# Patient Record
Sex: Female | Born: 1960 | Race: Black or African American | Hispanic: No | Marital: Single | Smoking: Never smoker
Health system: Southern US, Community
[De-identification: ages and names within clinical notes are randomized; demographics above are authoritative.]

## PROBLEM LIST (undated history)

## (undated) DIAGNOSIS — E669 Obesity, unspecified: Secondary | ICD-10-CM

## (undated) DIAGNOSIS — R609 Edema, unspecified: Secondary | ICD-10-CM

## (undated) DIAGNOSIS — M1990 Unspecified osteoarthritis, unspecified site: Secondary | ICD-10-CM

## (undated) DIAGNOSIS — I10 Essential (primary) hypertension: Secondary | ICD-10-CM

## (undated) DIAGNOSIS — T7840XA Allergy, unspecified, initial encounter: Secondary | ICD-10-CM

## (undated) DIAGNOSIS — R011 Cardiac murmur, unspecified: Secondary | ICD-10-CM

## (undated) DIAGNOSIS — F419 Anxiety disorder, unspecified: Secondary | ICD-10-CM

## (undated) DIAGNOSIS — F32A Depression, unspecified: Secondary | ICD-10-CM

## (undated) DIAGNOSIS — F329 Major depressive disorder, single episode, unspecified: Secondary | ICD-10-CM

## (undated) DIAGNOSIS — R0600 Dyspnea, unspecified: Secondary | ICD-10-CM

## (undated) HISTORY — DX: Depression, unspecified: F32.A

## (undated) HISTORY — PX: DILATION AND CURETTAGE OF UTERUS: SHX78

## (undated) HISTORY — DX: Allergy, unspecified, initial encounter: T78.40XA

## (undated) HISTORY — DX: Essential (primary) hypertension: I10

## (undated) HISTORY — DX: Cardiac murmur, unspecified: R01.1

## (undated) HISTORY — DX: Anxiety disorder, unspecified: F41.9

## (undated) HISTORY — DX: Unspecified osteoarthritis, unspecified site: M19.90

## (undated) HISTORY — DX: Major depressive disorder, single episode, unspecified: F32.9

## (undated) HISTORY — DX: Dyspnea, unspecified: R06.00

## (undated) HISTORY — DX: Obesity, unspecified: E66.9

## (undated) HISTORY — DX: Edema, unspecified: R60.9

---

## 1998-11-16 ENCOUNTER — Other Ambulatory Visit: Admission: RE | Admit: 1998-11-16 | Discharge: 1998-11-16 | Payer: Self-pay | Admitting: Obstetrics & Gynecology

## 1999-11-29 ENCOUNTER — Other Ambulatory Visit: Admission: RE | Admit: 1999-11-29 | Discharge: 1999-11-29 | Payer: Self-pay | Admitting: Obstetrics & Gynecology

## 2000-12-04 ENCOUNTER — Other Ambulatory Visit: Admission: RE | Admit: 2000-12-04 | Discharge: 2000-12-04 | Payer: Self-pay | Admitting: Obstetrics & Gynecology

## 2001-12-06 ENCOUNTER — Other Ambulatory Visit: Admission: RE | Admit: 2001-12-06 | Discharge: 2001-12-06 | Payer: Self-pay | Admitting: Obstetrics & Gynecology

## 2002-05-22 ENCOUNTER — Other Ambulatory Visit: Admission: RE | Admit: 2002-05-22 | Discharge: 2002-05-22 | Payer: Self-pay | Admitting: Obstetrics & Gynecology

## 2003-01-07 ENCOUNTER — Other Ambulatory Visit: Admission: RE | Admit: 2003-01-07 | Discharge: 2003-01-07 | Payer: Self-pay | Admitting: Obstetrics & Gynecology

## 2003-07-13 ENCOUNTER — Other Ambulatory Visit: Admission: RE | Admit: 2003-07-13 | Discharge: 2003-07-13 | Payer: Self-pay | Admitting: Obstetrics & Gynecology

## 2004-01-20 ENCOUNTER — Other Ambulatory Visit: Admission: RE | Admit: 2004-01-20 | Discharge: 2004-01-20 | Payer: Self-pay | Admitting: Obstetrics & Gynecology

## 2005-01-31 ENCOUNTER — Other Ambulatory Visit: Admission: RE | Admit: 2005-01-31 | Discharge: 2005-01-31 | Payer: Self-pay | Admitting: Obstetrics & Gynecology

## 2008-08-03 ENCOUNTER — Encounter: Payer: Self-pay | Admitting: Cardiovascular Disease

## 2008-08-04 ENCOUNTER — Encounter: Payer: Self-pay | Admitting: Cardiovascular Disease

## 2008-08-07 ENCOUNTER — Ambulatory Visit: Payer: Self-pay | Admitting: Cardiovascular Disease

## 2008-08-07 DIAGNOSIS — I10 Essential (primary) hypertension: Secondary | ICD-10-CM | POA: Insufficient documentation

## 2008-08-07 DIAGNOSIS — R079 Chest pain, unspecified: Secondary | ICD-10-CM | POA: Insufficient documentation

## 2009-12-08 ENCOUNTER — Emergency Department (HOSPITAL_COMMUNITY): Admission: EM | Admit: 2009-12-08 | Discharge: 2009-12-08 | Payer: Self-pay | Admitting: Emergency Medicine

## 2010-08-09 ENCOUNTER — Encounter: Payer: Self-pay | Admitting: Cardiovascular Disease

## 2011-01-13 ENCOUNTER — Ambulatory Visit (INDEPENDENT_AMBULATORY_CARE_PROVIDER_SITE_OTHER): Payer: PRIVATE HEALTH INSURANCE

## 2011-01-13 DIAGNOSIS — I10 Essential (primary) hypertension: Secondary | ICD-10-CM

## 2011-01-13 DIAGNOSIS — R059 Cough, unspecified: Secondary | ICD-10-CM

## 2011-01-13 DIAGNOSIS — R05 Cough: Secondary | ICD-10-CM

## 2011-01-13 DIAGNOSIS — J069 Acute upper respiratory infection, unspecified: Secondary | ICD-10-CM

## 2011-01-16 ENCOUNTER — Ambulatory Visit: Payer: Self-pay | Admitting: Internal Medicine

## 2011-03-10 ENCOUNTER — Ambulatory Visit (INDEPENDENT_AMBULATORY_CARE_PROVIDER_SITE_OTHER): Payer: PRIVATE HEALTH INSURANCE | Admitting: Physician Assistant

## 2011-03-10 DIAGNOSIS — Z113 Encounter for screening for infections with a predominantly sexual mode of transmission: Secondary | ICD-10-CM

## 2011-03-10 DIAGNOSIS — N946 Dysmenorrhea, unspecified: Secondary | ICD-10-CM

## 2011-03-10 DIAGNOSIS — N926 Irregular menstruation, unspecified: Secondary | ICD-10-CM

## 2011-03-10 DIAGNOSIS — A599 Trichomoniasis, unspecified: Secondary | ICD-10-CM

## 2011-03-10 DIAGNOSIS — N898 Other specified noninflammatory disorders of vagina: Secondary | ICD-10-CM

## 2011-03-10 DIAGNOSIS — R350 Frequency of micturition: Secondary | ICD-10-CM

## 2011-03-10 LAB — POCT WET PREP WITH KOH
KOH Prep POC: NEGATIVE
RBC Wet Prep HPF POC: NEGATIVE
Trichomonas, UA: POSITIVE
Yeast Wet Prep HPF POC: NEGATIVE

## 2011-03-10 LAB — POCT URINALYSIS DIPSTICK
Glucose, UA: NEGATIVE
Nitrite, UA: NEGATIVE
Protein, UA: 30
Spec Grav, UA: 1.015
Urobilinogen, UA: 2

## 2011-03-10 LAB — POCT UA - MICROSCOPIC ONLY: Yeast, UA: NEGATIVE

## 2011-03-10 MED ORDER — METRONIDAZOLE 500 MG PO TABS: 500.0000 mg | ORAL_TABLET | Freq: Two times a day (BID) | ORAL | Status: AC

## 2011-03-10 NOTE — Patient Instructions (Signed)
Please be sure to use condoms with each and every act of intercourse to reduce her risk for sexually transmitted infections. I will contact you with the results of your remaining labs, please contact me if you have not heard in 10-14 days. Complete the antibiotic. Take it with food to reduce nausea. Avoid all alcohol while taking the antibiotic. I advised abstaining from sexual activity until you have completed treatment.  You need to notify both sexual partners of your infection. They will both need treatment as well, and you should not engage in sexual activity with them until they have completed treatment as well.

## 2011-03-10 NOTE — Progress Notes (Signed)
Addended by: Fernande Bras on: 03/10/2011 02:48 PM   Modules accepted: Orders

## 2011-03-10 NOTE — Progress Notes (Signed)
  Subjective:    Patient ID: Samantha Pearson, female    DOB: 07/15/1960, 51 y.o.   MRN: [id number]  HPI Patient presents with burning on urination and vaginal discharge for the last 10-14 days. She notes the discharge is a brownish color but has seen no frank blood. Over the last several days she has developed urgency and frequency with urination. No fevers chills back pain or abdominal pain. She has 2 current sexual partners and uses condoms with neither. She is suspicious that one of them had another sexual partner as well. Last menstrual period was in November. She states that her GYN believes that she is in perimenopause and plans to do a trial off her oral contraceptive pill later this year.   Review of Systems  Constitutional: Negative for fever and chills.  Gastrointestinal: Negative.   Genitourinary: Positive for dysuria, urgency, frequency, vaginal discharge and menstrual problem. Negative for hematuria, flank pain, decreased urine volume, vaginal bleeding, enuresis, difficulty urinating, genital sores, vaginal pain, pelvic pain and dyspareunia.  Musculoskeletal: Negative for back pain.       Objective:   Physical Exam  Constitutional: She is oriented to person, place, and time. [name]. No distress.  HENT:  Head: Normocephalic and atraumatic.  Cardiovascular: Normal rate, regular rhythm and normal heart sounds.   Pulmonary/Chest: Effort normal and breath sounds normal.  Abdominal: Soft. Bowel sounds are normal. She exhibits no distension and no mass. There is tenderness. There is no rebound and no guarding.  Genitourinary: Uterus normal. Pelvic exam was performed with patient supine. No labial fusion. There is no rash, tenderness, lesion or injury on the right labia. There is no rash, tenderness, lesion or injury on the left labia. No erythema, tenderness or bleeding around the vagina. No foreign body around the vagina. No signs of injury around the  vagina. Vaginal discharge found.  Lymphadenopathy:       Right: No inguinal adenopathy present.       Left: No inguinal adenopathy present.  Neurological: She is alert and oriented to person, place, and time.  Skin: Skin is warm and dry.  Psychiatric: She has a normal mood and affect.   Urinalysis is remarkable for blood and leukocytes but negative for nitrites. Microscopy significant for Trichomonas. Wet prep confirms Trichomonas. HIV, RPR, Gen-Probe are pending.       Assessment & Plan:  1. Vaginal discharge secondary to Trichomonas vaginalis.  Treat with metronidazole. 2. Urinary urgency and frequency, likely secondary to Trichomonas vaginalis.  3. STD screening secondary to high-risk sexual activity.  Counseled on consistent use of condom use to prevent sexually transmitted infections, especially in the scenario of more than one sexual partner. Await for remaining lab results.

## 2011-03-11 LAB — GC/CHLAMYDIA PROBE AMP, GENITAL: Chlamydia, DNA Probe: NEGATIVE

## 2011-03-13 ENCOUNTER — Other Ambulatory Visit: Payer: Self-pay | Admitting: Physician Assistant

## 2011-03-13 ENCOUNTER — Telehealth: Payer: Self-pay

## 2011-03-13 DIAGNOSIS — N390 Urinary tract infection, site not specified: Secondary | ICD-10-CM

## 2011-03-13 MED ORDER — AMOXICILLIN 875 MG PO TABS: 875.0000 mg | ORAL_TABLET | Freq: Two times a day (BID) | ORAL | Status: AC

## 2011-04-24 ENCOUNTER — Ambulatory Visit: Payer: PRIVATE HEALTH INSURANCE | Admitting: Internal Medicine

## 2011-05-22 ENCOUNTER — Ambulatory Visit (INDEPENDENT_AMBULATORY_CARE_PROVIDER_SITE_OTHER): Payer: PRIVATE HEALTH INSURANCE | Admitting: Internal Medicine

## 2011-05-22 ENCOUNTER — Telehealth: Payer: Self-pay | Admitting: Physician Assistant

## 2011-05-22 ENCOUNTER — Encounter: Payer: Self-pay | Admitting: Internal Medicine

## 2011-05-22 VITALS — BP 135/75 | HR 69 | Temp 98.1°F | Resp 16 | Ht 59.0 in | Wt 209.6 lb

## 2011-05-22 DIAGNOSIS — G4700 Insomnia, unspecified: Secondary | ICD-10-CM

## 2011-05-22 DIAGNOSIS — F439 Reaction to severe stress, unspecified: Secondary | ICD-10-CM | POA: Insufficient documentation

## 2011-05-22 DIAGNOSIS — I10 Essential (primary) hypertension: Secondary | ICD-10-CM

## 2011-05-22 DIAGNOSIS — Z79899 Other long term (current) drug therapy: Secondary | ICD-10-CM

## 2011-05-22 DIAGNOSIS — G8929 Other chronic pain: Secondary | ICD-10-CM

## 2011-05-22 LAB — BASIC METABOLIC PANEL
BUN: 15 mg/dL (ref 6–23)
CO2: 27 mEq/L (ref 19–32)
Glucose, Bld: 95 mg/dL (ref 70–99)
Potassium: 3.4 mEq/L — ABNORMAL LOW (ref 3.5–5.3)
Sodium: 138 mEq/L (ref 135–145)

## 2011-05-22 MED ORDER — LISINOPRIL-HYDROCHLOROTHIAZIDE 20-25 MG PO TABS: 1.0000 | ORAL_TABLET | Freq: Every day | ORAL | Status: DC

## 2011-05-22 MED ORDER — ALPRAZOLAM 1 MG PO TABS: 1.0000 mg | ORAL_TABLET | Freq: Every evening | ORAL | Status: AC | PRN

## 2011-05-22 MED ORDER — LISINOPRIL-HYDROCHLOROTHIAZIDE 10-12.5 MG PO TABS: 1.0000 | ORAL_TABLET | Freq: Every day | ORAL | Status: DC

## 2011-05-22 NOTE — Progress Notes (Signed)
  Subjective:    Patient ID: Samantha Pearson, female    DOB: 22-Jul-1960, 51 y.o.   MRN: [id number]  HPI HTN controlled Stress getting better, is working Systems developer. Chronic pain is controlled with prn tramadol 50mg  Working rotating shifts   Review of Systems     Objective:   Physical Exam  Constitutional: She is oriented to person, place, and time. She appears well-nourished. No distress.  Cardiovascular: Normal rate, regular rhythm and normal heart sounds.   Pulmonary/Chest: Effort normal and breath sounds normal.  Neurological: She is alert and oriented to person, place, and time.  Skin: Skin is warm and dry.          Assessment & Plan:  HTN Stress INsomnia do to shift work,trial of alprazolam .5 qhs

## 2011-05-22 NOTE — Telephone Encounter (Signed)
Pharmacy fax received requesting clarification: LisinoprilHCT 10/12.5 or 20/25. Chart reviewed.  It appears that the patient now takes 20/25.

## 2011-05-22 NOTE — Patient Instructions (Signed)

## 2011-11-27 ENCOUNTER — Ambulatory Visit: Payer: PRIVATE HEALTH INSURANCE | Admitting: Internal Medicine

## 2011-12-01 ENCOUNTER — Encounter: Payer: Self-pay | Admitting: Internal Medicine

## 2011-12-01 ENCOUNTER — Ambulatory Visit (INDEPENDENT_AMBULATORY_CARE_PROVIDER_SITE_OTHER): Payer: PRIVATE HEALTH INSURANCE | Admitting: Internal Medicine

## 2011-12-01 VITALS — BP 125/71 | HR 69 | Temp 98.0°F | Resp 16 | Ht 59.5 in | Wt 213.0 lb

## 2011-12-01 DIAGNOSIS — R635 Abnormal weight gain: Secondary | ICD-10-CM

## 2011-12-01 DIAGNOSIS — E663 Overweight: Secondary | ICD-10-CM

## 2011-12-01 DIAGNOSIS — J329 Chronic sinusitis, unspecified: Secondary | ICD-10-CM

## 2011-12-01 DIAGNOSIS — I10 Essential (primary) hypertension: Secondary | ICD-10-CM

## 2011-12-01 DIAGNOSIS — G4700 Insomnia, unspecified: Secondary | ICD-10-CM

## 2011-12-01 MED ORDER — ALPRAZOLAM 1 MG PO TABS: 1.0000 mg | ORAL_TABLET | Freq: Every evening | ORAL | Status: DC | PRN

## 2011-12-01 MED ORDER — AMOXICILLIN 500 MG PO CAPS: 1000.0000 mg | ORAL_CAPSULE | Freq: Two times a day (BID) | ORAL | Status: DC

## 2011-12-01 NOTE — Addendum Note (Signed)
Addended by: Jonita Albee on: 12/01/2011 09:20 AM   Modules accepted: Orders

## 2011-12-01 NOTE — Patient Instructions (Signed)

## 2011-12-01 NOTE — Progress Notes (Signed)
  Subjective:    Patient ID: Samantha Pearson, female    DOB: 1961/01/03, 51 y.o.   MRN: [id number]  HPI BP controlled today. Has sinus infection with green discharge. Anxiety--did not get alprazolam as ordered.Insomnia persists and melatonin.  Review of Systems     Objective:   Physical Exam Tender frontals/maxillarys Lungs clear Heart nl       Assessment & Plan:  HTN to goal Sinusitis Insomnia/ anxiety

## 2011-12-11 ENCOUNTER — Telehealth: Payer: Self-pay

## 2011-12-11 NOTE — Telephone Encounter (Signed)
PT WOULD LIKE TO KNOW IF SHE NEEDS TO MAKE A FOLLOW-UP VISIT WITH DR. Perrin Maltese.

## 2011-12-12 NOTE — Telephone Encounter (Signed)
Schedule routine 6 mo f/up, or come in sooner if needed.

## 2012-05-13 ENCOUNTER — Ambulatory Visit (INDEPENDENT_AMBULATORY_CARE_PROVIDER_SITE_OTHER): Payer: PRIVATE HEALTH INSURANCE | Admitting: Internal Medicine

## 2012-05-13 VITALS — BP 140/81 | HR 66 | Temp 98.0°F | Resp 16 | Ht 60.0 in | Wt 212.0 lb

## 2012-05-13 DIAGNOSIS — F439 Reaction to severe stress, unspecified: Secondary | ICD-10-CM

## 2012-05-13 DIAGNOSIS — M722 Plantar fascial fibromatosis: Secondary | ICD-10-CM

## 2012-05-13 DIAGNOSIS — Z79899 Other long term (current) drug therapy: Secondary | ICD-10-CM

## 2012-05-13 DIAGNOSIS — I10 Essential (primary) hypertension: Secondary | ICD-10-CM

## 2012-05-13 DIAGNOSIS — G4700 Insomnia, unspecified: Secondary | ICD-10-CM

## 2012-05-13 LAB — COMPREHENSIVE METABOLIC PANEL
AST: 22 U/L (ref 0–37)
Alkaline Phosphatase: 80 U/L (ref 39–117)
BUN: 16 mg/dL (ref 6–23)
Glucose, Bld: 81 mg/dL (ref 70–99)
Potassium: 3.8 mEq/L (ref 3.5–5.3)
Sodium: 138 mEq/L (ref 135–145)
Total Bilirubin: 0.8 mg/dL (ref 0.3–1.2)
Total Protein: 7.1 g/dL (ref 6.0–8.3)

## 2012-05-13 LAB — POCT UA - MICROSCOPIC ONLY
Bacteria, U Microscopic: NEGATIVE
Mucus, UA: NEGATIVE
RBC, urine, microscopic: NEGATIVE
WBC, Ur, HPF, POC: NEGATIVE

## 2012-05-13 LAB — POCT URINALYSIS DIPSTICK
Bilirubin, UA: NEGATIVE
Ketones, UA: NEGATIVE
Leukocytes, UA: NEGATIVE
Spec Grav, UA: <=1.005

## 2012-05-13 MED ORDER — IBUPROFEN 600 MG PO TABS: 600.0000 mg | ORAL_TABLET | Freq: Three times a day (TID) | ORAL | Status: DC | PRN

## 2012-05-13 MED ORDER — LISINOPRIL-HYDROCHLOROTHIAZIDE 20-25 MG PO TABS: 1.0000 | ORAL_TABLET | Freq: Every day | ORAL | Status: DC

## 2012-05-13 MED ORDER — ALPRAZOLAM 0.5 MG PO TABS: 0.5000 mg | ORAL_TABLET | Freq: Every evening | ORAL | Status: DC | PRN

## 2012-05-13 NOTE — Progress Notes (Signed)
  Subjective:    Patient ID: Samantha Pearson, female    DOB: January 08, 1961, 52 y.o.   MRN: [id number]  HPI Doing better overall. Works 2 jobs, stressful, affects sleep. HTN controlled and meds tolerated. Does need smaller dose alprazolam to sleep   Review of Systems Same    Objective:   Physical Exam  Constitutional: She is oriented to person, place, and time. [name].  Eyes: EOM are normal.  Cardiovascular: Normal rate, regular rhythm and normal heart sounds.   Pulmonary/Chest: Effort normal and breath sounds normal.  Musculoskeletal: She exhibits tenderness.  Neurological: She is alert and oriented to person, place, and time. No cranial nerve deficit. She exhibits normal muscle tone. Coordination normal.  Psychiatric: She has a normal mood and affect. [name].          Assessment & Plan:  Plantar fasciitis HTN Insomnia/stress--change dose alprazolam to .5mg  hs cpe 6 mos

## 2012-05-13 NOTE — Patient Instructions (Addendum)
Plantar Fasciitis (Heel Spur Syndrome) with Rehab The plantar fascia is a fibrous, ligament-like, soft-tissue structure that spans the bottom of the foot. Plantar fasciitis is a condition that causes pain in the foot due to inflammation of the tissue. SYMPTOMS   Pain and tenderness on the underneath side of the foot.  Pain that worsens with standing or walking. CAUSES  Plantar fasciitis is caused by irritation and injury to the plantar fascia on the underneath side of the foot. Common mechanisms of injury include:  Direct trauma to bottom of the foot.  Damage to a small nerve that runs under the foot where the main fascia attaches to the heel bone.  Stress placed on the plantar fascia due to bone spurs. RISK INCREASES WITH:   Activities that place stress on the plantar fascia (running, jumping, pivoting, or cutting).  Poor strength and flexibility.  Improperly fitted shoes.  Tight calf muscles.  Flat feet.  Failure to warm-up properly before activity.  Obesity. PREVENTION  Warm up and stretch properly before activity.  Allow for adequate recovery between workouts.  Maintain physical fitness:  Strength, flexibility, and endurance.  Cardiovascular fitness.  Maintain a health body weight.  Avoid stress on the plantar fascia.  Wear properly fitted shoes, including arch supports for individuals who have flat feet. PROGNOSIS  If treated properly, then the symptoms of plantar fasciitis usually resolve without surgery. However, occasionally surgery is necessary. RELATED COMPLICATIONS   Recurrent symptoms that may result in a chronic condition.  Problems of the lower back that are caused by compensating for the injury, such as limping.  Pain or weakness of the foot during push-off following surgery.  Chronic inflammation, scarring, and partial or complete fascia tear, occurring more often from repeated injections. TREATMENT  Treatment initially involves the use of  ice and medication to help reduce pain and inflammation. The use of strengthening and stretching exercises may help reduce pain with activity, especially stretches of the Achilles tendon. These exercises may be performed at home or with a therapist. Your caregiver may recommend that you use heel cups of arch supports to help reduce stress on the plantar fascia. Occasionally, corticosteroid injections are given to reduce inflammation. If symptoms persist for greater than 6 months despite non-surgical (conservative), then surgery may be recommended.  MEDICATION   If pain medication is necessary, then nonsteroidal anti-inflammatory medications, such as aspirin and ibuprofen, or other minor pain relievers, such as acetaminophen, are often recommended.  Do not take pain medication within 7 days before surgery.  Prescription pain relievers may be given if deemed necessary by your caregiver. Use only as directed and only as much as you need.  Corticosteroid injections may be given by your caregiver. These injections should be reserved for the most serious cases, because they may only be given a certain number of times. HEAT AND COLD  Cold treatment (icing) relieves pain and reduces inflammation. Cold treatment should be applied for 10 to 15 minutes every 2 to 3 hours for inflammation and pain and immediately after any activity that aggravates your symptoms. Use ice packs or massage the area with a piece of ice (ice massage).  Heat treatment may be used prior to performing the stretching and strengthening activities prescribed by your caregiver, physical therapist, or athletic trainer. Use a heat pack or soak the injury in warm water. SEEK IMMEDIATE MEDICAL CARE IF:  Treatment seems to offer no benefit, or the condition worsens.  Any medications produce adverse side effects. EXERCISES RANGE   OF MOTION (ROM) AND STRETCHING EXERCISES - Plantar Fasciitis (Heel Spur Syndrome) These exercises may help you  when beginning to rehabilitate your injury. Your symptoms may resolve with or without further involvement from your physician, physical therapist or athletic trainer. While completing these exercises, remember:   Restoring tissue flexibility helps normal motion to return to the joints. This allows healthier, less painful movement and activity.  An effective stretch should be held for at least 30 seconds.  A stretch should never be painful. You should only feel a gentle lengthening or release in the stretched tissue. RANGE OF MOTION - Toe Extension, Flexion  Sit with your right / left leg crossed over your opposite knee.  Grasp your toes and gently pull them back toward the top of your foot. You should feel a stretch on the bottom of your toes and/or foot.  Hold this stretch for __________ seconds.  Now, gently pull your toes toward the bottom of your foot. You should feel a stretch on the top of your toes and or foot.  Hold this stretch for __________ seconds. Repeat __________ times. Complete this stretch __________ times per day.  RANGE OF MOTION - Ankle Dorsiflexion, Active Assisted  Remove shoes and sit on a chair that is preferably not on a carpeted surface.  Place right / left foot under knee. Extend your opposite leg for support.  Keeping your heel down, slide your right / left foot back toward the chair until you feel a stretch at your ankle or calf. If you do not feel a stretch, slide your bottom forward to the edge of the chair, while still keeping your heel down.  Hold this stretch for __________ seconds. Repeat __________ times. Complete this stretch __________ times per day.  STRETCH  Gastroc, Standing  Place hands on wall.  Extend right / left leg, keeping the front knee somewhat bent.  Slightly point your toes inward on your back foot.  Keeping your right / left heel on the floor and your knee straight, shift your weight toward the wall, not allowing your back to  arch.  You should feel a gentle stretch in the right / left calf. Hold this position for __________ seconds. Repeat __________ times. Complete this stretch __________ times per day. STRETCH  Soleus, Standing  Place hands on wall.  Extend right / left leg, keeping the other knee somewhat bent.  Slightly point your toes inward on your back foot.  Keep your right / left heel on the floor, bend your back knee, and slightly shift your weight over the back leg so that you feel a gentle stretch deep in your back calf.  Hold this position for __________ seconds. Repeat __________ times. Complete this stretch __________ times per day. STRETCH  Gastrocsoleus, Standing  Note: This exercise can place a lot of stress on your foot and ankle. Please complete this exercise only if specifically instructed by your caregiver.   Place the ball of your right / left foot on a step, keeping your other foot firmly on the same step.  Hold on to the wall or a rail for balance.  Slowly lift your other foot, allowing your body weight to press your heel down over the edge of the step.  You should feel a stretch in your right / left calf.  Hold this position for __________ seconds.  Repeat this exercise with a slight bend in your right / left knee. Repeat __________ times. Complete this stretch __________ times per day.    STRENGTHENING EXERCISES - Plantar Fasciitis (Heel Spur Syndrome)  These exercises may help you when beginning to rehabilitate your injury. They may resolve your symptoms with or without further involvement from your physician, physical therapist or athletic trainer. While completing these exercises, remember:   Muscles can gain both the endurance and the strength needed for everyday activities through controlled exercises.  Complete these exercises as instructed by your physician, physical therapist or athletic trainer. Progress the resistance and repetitions only as guided. STRENGTH - Towel  Curls  Sit in a chair positioned on a non-carpeted surface.  Place your foot on a towel, keeping your heel on the floor.  Pull the towel toward your heel by only curling your toes. Keep your heel on the floor.  If instructed by your physician, physical therapist or athletic trainer, add ____________________ at the end of the towel. Repeat __________ times. Complete this exercise __________ times per day. STRENGTH - Ankle Inversion  Secure one end of a rubber exercise band/tubing to a fixed object (table, pole). Loop the other end around your foot just before your toes.  Place your fists between your knees. This will focus your strengthening at your ankle.  Slowly, pull your big toe up and in, making sure the band/tubing is positioned to resist the entire motion.  Hold this position for __________ seconds.  Have your muscles resist the band/tubing as it slowly pulls your foot back to the starting position. Repeat __________ times. Complete this exercises __________ times per day.  Document Released: 01/23/2005 Document Revised: 04/17/2011 Document Reviewed: 05/07/2008 [facility] Patient Information 2013 [place], [place]. Stress Stress-related medical problems are becoming increasingly common. The body has a built-in physical response to stressful situations. Faced with pressure, challenge or danger, we need to react quickly. Our bodies release hormones such as cortisol and adrenaline to help do this. These hormones are part of the "fight or flight" response and affect the metabolic rate, heart rate and blood pressure, resulting in a heightened, stressed state that prepares the body for optimum performance in dealing with a stressful situation. It is likely that early man required these mechanisms to stay alive, but usually modern stresses do not call for this, and the same hormones released in today's world can damage health and reduce coping ability. CAUSES  Pressure to perform at work, at  school or in sports.  Threats of physical violence.  Money worries.  Arguments.  Family conflicts.  Divorce or separation from significant other.  Bereavement.  New job or unemployment.  Changes in location.  Alcohol or drug abuse. SOMETIMES, THERE IS NO PARTICULAR REASON FOR DEVELOPING STRESS. Almost all people are at risk of being stressed at some time in their lives. It is important to know that some stress is temporary and some is long term.  Temporary stress will go away when a situation is resolved. Most people can cope with short periods of stress, and it can often be relieved by relaxing, taking a walk, chatting through issues with friends, or having a good night's sleep.  Chronic (long-term, continuous) stress is much harder to deal with. It can be psychologically and emotionally damaging. It can be harmful both for an individual and for friends and family. SYMPTOMS Everyone reacts to stress differently. There are some common effects that help [place] recognize it. In times of extreme stress, people may:  Shake uncontrollably.  Breathe faster and deeper than normal (hyperventilate).  Vomit.  For people with asthma, stress can trigger an attack.  For  some people, stress may trigger migraine headaches, ulcers, and body pain. PHYSICAL EFFECTS OF STRESS MAY INCLUDE:  Loss of energy.  Skin problems.  Aches and pains resulting from tense muscles, including neck ache, backache and tension headaches.  Increased pain from arthritis and other conditions.  Irregular heart beat (palpitations).  Periods of irritability or anger.  Apathy or depression.  Anxiety (feeling uptight or worrying).  Unusual behavior.  Loss of appetite.  Comfort eating.  Lack of concentration.  Loss of, or decreased, sex-drive.  Increased smoking, drinking, or recreational drug use.  For women, missed periods.  Ulcers, joint pain, and muscle pain. Post-traumatic stress is the  stress caused by any serious accident, strong emotional damage, or extremely difficult or violent experience such as rape or war. Post-traumatic stress victims can experience mixtures of emotions such as fear, shame, depression, guilt or anger. It may include recurrent memories or images that may be haunting. These feelings can last for weeks, months or even years after the traumatic event that triggered them. Specialized treatment, possibly with medicines and psychological therapies, is available. If stress is causing physical symptoms, severe distress or making it difficult for you to function as normal, it is worth seeing your caregiver. It is important to remember that although stress is a usual part of life, extreme or prolonged stress can lead to other illnesses that will need treatment. It is better to visit a doctor sooner rather than later. Stress has been linked to the development of high blood pressure and heart disease, as well as insomnia and depression. There is no diagnostic test for stress since everyone reacts to it differently. But a caregiver will be able to spot the physical symptoms, such as:  Headaches.  Shingles.  Ulcers. Emotional distress such as intense worry, low mood or irritability should be detected when the doctor asks pertinent questions to identify any underlying problems that might be the cause. In case there are physical reasons for the symptoms, the doctor may also want to do some tests to exclude certain conditions. If you feel that you are suffering from stress, try to identify the aspects of your life that are causing it. Sometimes you may not be able to change or avoid them, but even a small change can have a positive ripple effect. A simple lifestyle change can make all the difference. STRATEGIES THAT CAN HELP DEAL WITH STRESS:  Delegating or sharing responsibilities.  Avoiding confrontations.  Learning to be more assertive.  Regular exercise.  Avoid  using alcohol or street drugs to cope.  Eating a healthy, balanced diet, rich in fruit and vegetables and proteins.  Finding humor or absurdity in stressful situations.  Never taking on more than you know you can handle comfortably.  Organizing your time better to get as much done as possible.  Talking to friends or family and sharing your thoughts and fears.  Listening to music or relaxation tapes.  Tensing and then relaxing your muscles, starting at the toes and working up to the head and neck. If you think that you would benefit from help, either in identifying the things that are causing your stress or in learning techniques to help you relax, see a caregiver who is capable of helping you with this. Rather than relying on medications, it is usually better to try and identify the things in your life that are causing stress and try to deal with them. There are many techniques of managing stress including counseling, psychotherapy, aromatherapy, yoga, and  exercise. Your caregiver can help you determine what is best for you. Document Released: 04/15/2002 Document Revised: 04/17/2011 Document Reviewed: 03/12/2007 [facility] Patient Information 2013 [place], [place].

## 2012-05-14 ENCOUNTER — Encounter: Payer: Self-pay | Admitting: Family Medicine

## 2012-06-27 ENCOUNTER — Ambulatory Visit (INDEPENDENT_AMBULATORY_CARE_PROVIDER_SITE_OTHER): Payer: PRIVATE HEALTH INSURANCE | Admitting: Physician Assistant

## 2012-06-27 VITALS — BP 130/82 | HR 96 | Temp 98.8°F | Resp 16 | Ht 60.0 in | Wt 210.0 lb

## 2012-06-27 DIAGNOSIS — R05 Cough: Secondary | ICD-10-CM

## 2012-06-27 DIAGNOSIS — J3489 Other specified disorders of nose and nasal sinuses: Secondary | ICD-10-CM

## 2012-06-27 DIAGNOSIS — J329 Chronic sinusitis, unspecified: Secondary | ICD-10-CM

## 2012-06-27 DIAGNOSIS — R0981 Nasal congestion: Secondary | ICD-10-CM

## 2012-06-27 DIAGNOSIS — R059 Cough, unspecified: Secondary | ICD-10-CM

## 2012-06-27 MED ORDER — AMOXICILLIN-POT CLAVULANATE 875-125 MG PO TABS: 1.0000 | ORAL_TABLET | Freq: Two times a day (BID) | ORAL | Status: DC

## 2012-06-27 MED ORDER — IPRATROPIUM BROMIDE 0.03 % NA SOLN: 2.0000 | Freq: Two times a day (BID) | NASAL | Status: DC

## 2012-06-27 NOTE — Progress Notes (Signed)
  Subjective:    Patient ID: Samantha Pearson, female    DOB: Jul 28, 1960, 52 y.o.   MRN: [id number]  HPI 52 year old female presents with 1 week history of sinus pain, pressure, nasal congestion, rhinorrhea, PND, and dry, hacking cough.  Does have a history of environmental allergies especially this time of year.  Does believe symptoms started after being exposed to a "cloud of pollen" while driving to work.  Has had a lot of sneezing, itchy ears, rhinorrhea, and PND.  Has been taking Zyrtec daily which does help but she was worried it was causing her BP to be elevated so she stopped it. Hx of a left sided nasal polyp that she reports does make her prone to sinusitis.  Denies fever, chills, headache, dizziness, SOB, wheezing, or hemoptysis.  She states she has been prescribed nasal sprays before but does not tolerate "steroid sprays" because it makes her drowsy.  Has had slight nausea and decreased appetite but no emesis.  Patient is otherwise doing well with no other concerns today.     Review of Systems  Constitutional: Negative for fever and chills.  HENT: Positive for congestion, rhinorrhea, postnasal drip and sinus pressure. Negative for ear pain, sore throat and neck stiffness.   Gastrointestinal: Positive for nausea. Negative for vomiting and abdominal pain.  Skin: Negative for rash.  Neurological: Negative for dizziness and headaches.       Objective:   Physical Exam  Constitutional: She is oriented to person, place, and time. [name].  HENT:  Head: Normocephalic and atraumatic.  Right Ear: Hearing, tympanic membrane, external ear and ear canal normal.  Left Ear: Hearing, tympanic membrane, external ear and ear canal normal.  Nose: Right sinus exhibits no maxillary sinus tenderness and no frontal sinus tenderness. Left sinus exhibits maxillary sinus tenderness. Left sinus exhibits no frontal sinus tenderness.  Mouth/Throat: Uvula is midline,  oropharynx is clear and moist and mucous membranes are normal. No oropharyngeal exudate.  Eyes: Conjunctivae are normal.  Neck: Normal range of motion. Neck supple.  Cardiovascular: Normal rate, regular rhythm and normal heart sounds.   Pulmonary/Chest: Effort normal and breath sounds normal.  Lymphadenopathy:    She has no cervical adenopathy.  Neurological: She is alert and oriented to person, place, and time.  Psychiatric: She has a normal mood and affect. [name]. Judgment and thought content normal.          Assessment & Plan:  Sinusitis - Plan: amoxicillin-clavulanate (AUGMENTIN) 875-125 MG per tablet, ipratropium (ATROVENT) 0.03 % nasal spray  Nasal congestion - Plan: ipratropium (ATROVENT) 0.03 % nasal spray  Cough  Start Augmentin 875 mg bid x 10 days Atrovent NS twice daily to help with nasal congestion and sinus pressure Continue Zyrtec daily - monitor BP If no improvement in 48-72 hours, can consider prednisone taper Follow up if symptoms worsen or fail to improve.

## 2012-12-27 ENCOUNTER — Ambulatory Visit: Payer: PRIVATE HEALTH INSURANCE

## 2012-12-27 ENCOUNTER — Ambulatory Visit: Payer: Self-pay

## 2012-12-27 ENCOUNTER — Ambulatory Visit (INDEPENDENT_AMBULATORY_CARE_PROVIDER_SITE_OTHER): Payer: PRIVATE HEALTH INSURANCE | Admitting: Internal Medicine

## 2012-12-27 VITALS — BP 128/80 | HR 68 | Temp 98.4°F | Resp 18 | Ht 60.0 in | Wt 217.0 lb

## 2012-12-27 DIAGNOSIS — R635 Abnormal weight gain: Secondary | ICD-10-CM

## 2012-12-27 DIAGNOSIS — M25571 Pain in right ankle and joints of right foot: Secondary | ICD-10-CM

## 2012-12-27 DIAGNOSIS — M79671 Pain in right foot: Secondary | ICD-10-CM

## 2012-12-27 DIAGNOSIS — M25579 Pain in unspecified ankle and joints of unspecified foot: Secondary | ICD-10-CM

## 2012-12-27 DIAGNOSIS — I10 Essential (primary) hypertension: Secondary | ICD-10-CM

## 2012-12-27 DIAGNOSIS — M79609 Pain in unspecified limb: Secondary | ICD-10-CM

## 2012-12-27 LAB — BASIC METABOLIC PANEL
BUN: 11 mg/dL (ref 6–23)
Creat: 0.78 mg/dL (ref 0.50–1.10)
Potassium: 3.9 mEq/L (ref 3.5–5.3)

## 2012-12-27 NOTE — Progress Notes (Signed)
  Subjective:    Patient ID: Samantha Pearson, female    DOB: Jul 27, 1960, 52 y.o.   MRN: [id number]  HPI Pt here for medication refill on lisinopril/hctz. Blood pressure 136/8-128/80. Pt is also concerned about her weigh gain she noticed that due to this her right ankle is hurting all the time. She had tried special exercise with no improvement. 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    She would like help losing weight, she is exercing. She needs bp and meds f/up.    Review of Systems     Objective:   Physical Exam  Vitals reviewed. Constitutional: She is oriented to person, place, and time. She appears well-nourished. No distress.  HENT:  Head: Normocephalic.  Eyes: EOM are normal.  Cardiovascular: Normal rate.   Pulmonary/Chest: Effort normal.  Musculoskeletal: She exhibits tenderness. She exhibits no edema.       Right foot: She exhibits decreased range of motion, tenderness and bony tenderness. She exhibits no swelling, normal capillary refill, no crepitus, no deformity and no laceration.       Feet:  Neurological: She is alert and oriented to person, place, and time. She exhibits normal muscle tone. Coordination normal.  Skin: No rash noted.  Psychiatric: She has a normal mood and affect.     [facility] reading (PRIMARY) by  Dr Perrin Maltese heel spurs, no stress fx seen       Assessment & Plan:  Achilles tendonitis/Plantar fasciitis/Refer Dr. Victorino Dike HTN rf meds 1 year Obesity/Refer to [facility] nutrition

## 2012-12-27 NOTE — Patient Instructions (Addendum)
[facility] [address], [place] [address]  [phone]    [facility] [address] [place] [address] [phone]  Calorie Counting Diet A calorie counting diet requires you to eat the number of calories that are right for you in a day. Calories are the measurement of how much energy you get from the food you eat. Eating the right amount of calories is important for staying at a healthy weight. If you eat too many calories, your body will store them as fat and you may gain weight. If you eat too few calories, you may lose weight. Counting the number of calories you eat during a day will help you know if you are eating the right amount. A Registered Dietitian can determine how many calories you need in a day. The amount of calories needed varies from person to person. If your goal is to lose weight, you will need to eat fewer calories. Losing weight can benefit you if you are overweight or have health problems such as heart disease, high blood pressure, or diabetes. If your goal is to gain weight, you will need to eat more calories. Gaining weight may be necessary if you have a certain health problem that causes your body to need more energy. TIPS Whether you are increasing or decreasing the number of calories you eat during a day, it may be hard to get used to changes in what you eat and drink. The following are tips to help you keep track of the number of calories you eat.  Measure foods at home with measuring cups. This helps you know the amount of food and number of calories you are eating.  Restaurants often serve food in amounts that are larger than 1 serving. While eating out, estimate how many servings of a food you are given. For example, a serving of cooked rice is  cup or about the size of half of a fist. Knowing serving sizes will help you be aware of how much food you are eating at restaurants.  Ask for smaller portion sizes or child-size portions  at restaurants.  Plan to eat half of a meal at a restaurant. Take the rest home or share the other half with a friend.  Read the Nutrition Facts panel on food labels for calorie content and serving size. You can find out how many servings are in a package, the size of a serving, and the number of calories each serving has.  For example, a package might contain 3 cookies. The Nutrition Facts panel on that package says that 1 serving is 1 cookie. Below that, it will say there are 3 servings in the container. The calories section of the Nutrition Facts label says there are 90 calories. This means there are 90 calories in 1 cookie (1 serving). If you eat 1 cookie you have eaten 90 calories. If you eat all 3 cookies, you have eaten 270 calories (3 servings x 90 calories = 270 calories). The list below tells you how big or small some common portion sizes are.  1 oz.........4 stacked dice.  3 oz........Marland KitchenDeck of cards.  1 tsp.......Marland KitchenTip of little finger.  1 tbs......Marland KitchenMarland KitchenThumb.  2 tbs.......Marland KitchenGolf ball.   cup......Marland KitchenHalf of a fist.  1 cup.......Marland KitchenA fist. KEEP A FOOD LOG Write down every food item you eat, the amount you eat, and the number of calories in each food you eat during the day. At the end of the day, you can add up the total number  of calories you have eaten. It may help to keep a list like the one below. Find out the calorie information by reading the Nutrition Facts panel on food labels. Breakfast  Bran cereal (1 cup, 110 calories).  Fat-free milk ( cup, 45 calories). Snack  Apple (1 medium, 80 calories). Lunch  Spinach (1 cup, 20 calories).  Tomato ( medium, 20 calories).  Chicken breast strips (3 oz, 165 calories).  Shredded cheddar cheese ( cup, 110 calories).  Light [place] dressing (2 tbs, 60 calories).  Whole-wheat bread (1 slice, 80 calories).  Tub margarine (1 tsp, 35 calories).  Vegetable soup (1 cup, 160 calories). Dinner  Pork chop (3 oz, 190  calories).  Brown rice (1 cup, 215 calories).  Steamed broccoli ( cup, 20 calories).  Strawberries (1  cup, 65 calories).  Whipped cream (1 tbs, 50 calories). Daily Calorie Total: 1425 Document Released: 01/23/2005 Document Revised: 04/17/2011 Document Reviewed: 07/20/2006 [facility] Patient Information 2014 [place], [place]. Achilles Tendinitis Tendinitis a swelling and soreness of the tendon. The pain in the tendon (cord-like structure which attaches muscle to bone) is produced by tiny tears and the inflammation present in that tendon. It commonly occurs at the shoulders, heels, and elbows. It is usually caused by overusing the tendon and joint involved. Achilles tendinitis involves the Achilles tendon. This is the large tendon in the back of the leg just above the foot. It attaches the large muscles of the lower leg to the heel bone (called calcaneus).  This diagnosis (learning what is wrong) is made by examination. X-rays will be generally be normal if only tendinitis is present. HOME CARE INSTRUCTIONS   Apply ice to the injury for 15-20 minutes, 03-04 times per day. Put the ice in a plastic bag and place a towel between the bag of ice and your skin.  Try to avoid use other than gentle range of motion while the tendon is painful. Do not resume use until instructed by your caregiver. Then begin use gradually. Do not increase use to the point of pain. If pain does develop, decrease use and continue the above measures. Gradually increase activities that do not cause discomfort until you gradually achieve normal use.  Only take over-the-counter or prescription medicines for pain, discomfort, or fever as directed by your caregiver. SEEK MEDICAL CARE IF:   Your pain and swelling increase or pain is uncontrolled with medications.  You develop new, unexplained problems (symptoms) or an increase of the symptoms that brought you to your caregiver.  You develop an inability to move your toes or  foot, develop warmth and swelling in your foot, or begin running an unexplained temperature. MAKE SURE YOU:   Understand these instructions.  Will watch your condition.  Will get help right away if you are not doing well or get worse. Document Released: 11/02/2004 Document Revised: 04/17/2011 Document Reviewed: 09/04/2012 [facility] Patient Information 2014 [place], [place]. Plantar Fasciitis Plantar fasciitis is a common condition that causes foot pain. It is soreness (inflammation) of the band of tough fibrous tissue on the bottom of the foot that runs from the heel bone (calcaneus) to the ball of the foot. The cause of this soreness may be from excessive standing, poor fitting shoes, running on hard surfaces, being overweight, having an abnormal walk, or overuse (this is common in runners) of the painful foot or feet. It is also common in aerobic exercise dancers and ballet dancers. SYMPTOMS  Most people with plantar fasciitis complain of:  Severe pain in  the morning on the bottom of their foot especially when taking the first steps out of bed. This pain recedes after a few minutes of walking.  Severe pain is experienced also during walking following a long period of inactivity.  Pain is worse when walking barefoot or up stairs DIAGNOSIS   Your caregiver will diagnose this condition by examining and feeling your foot.  Special tests such as X-rays of your foot, are usually not needed. PREVENTION   Consult a sports medicine professional before beginning a new exercise program.  Walking programs offer a good workout. With walking there is a lower chance of overuse injuries common to runners. There is less impact and less jarring of the joints.  Begin all new exercise programs slowly. If problems or pain develop, decrease the amount of time or distance until you are at a comfortable level.  Wear good shoes and replace them regularly.  Stretch your foot and the heel cords at the back  of the ankle (Achilles tendon) both before and after exercise.  Run or exercise on even surfaces that are not hard. For example, asphalt is better than pavement.  Do not run barefoot on hard surfaces.  If using a treadmill, vary the incline.  Do not continue to workout if you have foot or joint problems. Seek professional help if they do not improve. HOME CARE INSTRUCTIONS   Avoid activities that cause you pain until you recover.  Use ice or cold packs on the problem or painful areas after working out.  Only take over-the-counter or prescription medicines for pain, discomfort, or fever as directed by your caregiver.  Soft shoe inserts or athletic shoes with air or gel sole cushions may be helpful.  If problems continue or become more severe, consult a sports medicine caregiver or your own health care provider. Cortisone is a potent anti-inflammatory medication that may be injected into the painful area. You can discuss this treatment with your caregiver. MAKE SURE YOU:   Understand these instructions.  Will watch your condition.  Will get help right away if you are not doing well or get worse. Document Released: 10/18/2000 Document Revised: 04/17/2011 Document Reviewed: 12/18/2007 [facility] Patient Information 2014 [place], [place].

## 2012-12-30 ENCOUNTER — Encounter: Payer: Self-pay | Admitting: *Deleted

## 2013-05-12 ENCOUNTER — Telehealth: Payer: Self-pay

## 2013-05-12 DIAGNOSIS — Z79899 Other long term (current) drug therapy: Secondary | ICD-10-CM

## 2013-05-12 DIAGNOSIS — I10 Essential (primary) hypertension: Secondary | ICD-10-CM

## 2013-05-12 MED ORDER — LISINOPRIL-HYDROCHLOROTHIAZIDE 20-25 MG PO TABS: 1.0000 | ORAL_TABLET | Freq: Every day | ORAL | Status: DC

## 2013-05-12 NOTE — Telephone Encounter (Signed)
Pt scheduled an appt with Dr Patsy Lageropland for 05/19/13, but requests refill until then. bf

## 2013-05-12 NOTE — Telephone Encounter (Signed)
Sent in 90 day Rx per Dr Ernestene MentionGuest's OV note OK for 1 year RFs. Notified pt.

## 2013-05-12 NOTE — Telephone Encounter (Signed)
Pt requests refill on her bp rx  Pharmacy: [facility] on [address]  bf

## 2013-05-19 ENCOUNTER — Encounter: Payer: Self-pay | Admitting: Family Medicine

## 2013-05-19 ENCOUNTER — Ambulatory Visit (INDEPENDENT_AMBULATORY_CARE_PROVIDER_SITE_OTHER): Payer: 59 | Admitting: Family Medicine

## 2013-05-19 VITALS — BP 140/78 | HR 84 | Temp 98.8°F | Resp 16 | Ht 59.0 in | Wt 217.2 lb

## 2013-05-19 DIAGNOSIS — G4700 Insomnia, unspecified: Secondary | ICD-10-CM

## 2013-05-19 DIAGNOSIS — R635 Abnormal weight gain: Secondary | ICD-10-CM

## 2013-05-19 DIAGNOSIS — I10 Essential (primary) hypertension: Secondary | ICD-10-CM

## 2013-05-19 MED ORDER — ALPRAZOLAM 0.5 MG PO TABS: 0.5000 mg | ORAL_TABLET | Freq: Every evening | ORAL | Status: DC | PRN

## 2013-05-19 NOTE — Patient Instructions (Signed)
Please try the alprazolam to help with your sleep.  Remember this will make you sleepy, so do not take ti before you need to drive.  We want to get you to sleep earlier so you will get more rest and have time to exercise for a little while prior to going to work.   Let's recheck in about 3 months.

## 2013-05-19 NOTE — Progress Notes (Signed)
[facility] [address], [place] [id number] [phone]  Date:  05/19/2013   Name:  Samantha Pearson   DOB:  1960/08/04   MRN:  [id number]  PCP:  Tally DueGUEST, CHRIS WARREN, MD    Chief Complaint: Follow-up   History of Present Illness:  Samantha Pearson is a 53 y.o. very pleasant female patient who presents with the following:  History of HTN- here today for a recheck of her BP.  She is on lisinopril/ hctz.  She did take her medication as usual today.  She is not fasting today  Concerned about weight gain over the last year or so. 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  She is not sleeping much at all, and admits she does not exrcise much and is not eating well either.   When she gets home in the morning she will be too activated form work to get to sleep for a couple of hours and then her sleep is fitful.  She may sleep for about 5 hours total before needing to go to her part- time job which she does before reporting for her 3rd shift.   She had an rx for xanax but did not get it filled in the past- wonders if this would be helpful for her.     Wt Readings from Last 3 Encounters:  05/19/13 217 lb 3.2 oz (98.521 kg)  12/27/12 217 lb (98.431 kg)  06/27/12 210 lb (95.255 kg)     Patient Active Problem List   Diagnosis Date Noted  . Stress 05/22/2011  . ESSENTIAL HYPERTENSION, BENIGN 08/07/2008  . CHEST PAIN 08/07/2008    Past Medical History  Diagnosis Date  . Chest pain 08/03/08    Urgent care visit  . Hypertension   . Anxiety   . Edema   . Dyspnea   . Obesity   . Allergy   . Depression     Past Surgical History  Procedure Laterality Date  . Cesarean section      History  Substance Use Topics  . Smoking status: Never Smoker   . Smokeless tobacco: Not on file     Comment: Nonsmoker  . Alcohol Use: No    Family History  Problem Relation Age of Onset  .  Heart failure Father     CHF    Allergies  Allergen Reactions  . Doxycycline Hives, Itching and Swelling  . Septra [Bactrim] Hives, Itching and Swelling  . Latex Swelling and Rash    Medication list has been reviewed and updated.  Current Outpatient Prescriptions on File Prior to Visit  Medication Sig Dispense Refill  . amoxicillin-clavulanate (AUGMENTIN) 875-125 MG per tablet Take 1 tablet by mouth 2 (two) times daily.  20 tablet  0  . [place]-Glucosamine-Vit D (GLUCOSAMINE COMPLEX PO) Take by mouth.      . cetirizine (ZYRTEC) 10 MG tablet Take 10 mg by mouth daily.      . Cholecalciferol 2000 UNITS TABS Take 2,000 Units by mouth daily.       . fish oil-omega-3 fatty acids 1000 MG capsule Take 1 g by mouth daily.        Marland Kitchen. ipratropium (ATROVENT) 0.03 % nasal spray Place 2 sprays into the nose 2 (two) times daily.  30 mL  5  . lisinopril-hydrochlorothiazide (PRINZIDE,ZESTORETIC) 20-25 MG per tablet Take 1 tablet by  mouth daily.  90 tablet  0  . magnesium 30 MG tablet Take 30 mg by mouth 2 (two) times daily.       No current facility-administered medications on file prior to visit.    Review of Systems: As per HPI- otherwise negative.    Physical Examination: Filed Vitals:   05/19/13 1151  BP: 140/78  Pulse: 84  Temp: 98.8 F (37.1 C)  Resp: 16   Filed Vitals:   05/19/13 1151  Height: 4\' 11"  (1.499 m)  Weight: 217 lb 3.2 oz (98.521 kg)   Body mass index is 43.85 kg/(m^2). Ideal Body Weight: Weight in (lb) to have BMI = 25: 123.5  GEN: WDWN, NAD, Non-toxic, A & O x 3, obese HEENT: Atraumatic, Normocephalic. Neck supple. No masses, No LAD. Ears and Nose: No external deformity. CV: RRR, No M/G/R. No JVD. No thrill. No extra heart sounds. PULM: CTA B, no wheezes, crackles, rhonchi. No retractions. No resp. distress. No accessory muscle use. ABD: S, NT, ND, +BS. No rebound. No HSM. EXTR: No c/c/e NEURO Normal gait.  PSYCH: Normally interactive. Conversant. Not  depressed or anxious appearing.  Calm demeanor.   Assessment and Plan: Insomnia - Plan: ALPRAZolam (XANAX) 0.5 MG tablet  Weight gain - Plan: TSH  HTN (hypertension) - Plan: Basic metabolic panel  Insomnia and weight gain.  Discussed the connection between poor sleep, stress, lack of exercise and weight gain.   Will try xanax as needed for sleep onset.  Hopefully this will cut down on the time she wastes trying to get to sleep and open up some time for exercise.  Check TSH and BMP BP is borderline today but we hope this will improve with better sleep and weight loss Plan recheck in 3 months.   Signed Abbe AmsterdamJessica Temekia Caskey, MD

## 2013-05-20 ENCOUNTER — Encounter: Payer: Self-pay | Admitting: Family Medicine

## 2013-05-20 LAB — BASIC METABOLIC PANEL
BUN: 14 mg/dL (ref 6–23)
CHLORIDE: 101 meq/L (ref 96–112)
CO2: 28 meq/L (ref 19–32)
Calcium: 9.4 mg/dL (ref 8.4–10.5)
Creat: 0.75 mg/dL (ref 0.50–1.10)
GLUCOSE: 117 mg/dL — AB (ref 70–99)
POTASSIUM: 3.4 meq/L — AB (ref 3.5–5.3)
SODIUM: 140 meq/L (ref 135–145)

## 2013-05-20 LAB — TSH: TSH: 1.558 u[IU]/mL (ref 0.350–4.500)

## 2013-08-18 ENCOUNTER — Other Ambulatory Visit: Payer: Self-pay | Admitting: Internal Medicine

## 2013-08-18 ENCOUNTER — Ambulatory Visit (INDEPENDENT_AMBULATORY_CARE_PROVIDER_SITE_OTHER): Payer: 59 | Admitting: Family Medicine

## 2013-08-18 ENCOUNTER — Encounter: Payer: Self-pay | Admitting: Family Medicine

## 2013-08-18 VITALS — BP 170/95 | HR 69 | Temp 98.1°F | Resp 16 | Ht 59.0 in | Wt 220.8 lb

## 2013-08-18 DIAGNOSIS — R7309 Other abnormal glucose: Secondary | ICD-10-CM

## 2013-08-18 DIAGNOSIS — Z6841 Body Mass Index (BMI) 40.0 and over, adult: Secondary | ICD-10-CM

## 2013-08-18 DIAGNOSIS — F4321 Adjustment disorder with depressed mood: Secondary | ICD-10-CM

## 2013-08-18 DIAGNOSIS — E669 Obesity, unspecified: Secondary | ICD-10-CM

## 2013-08-18 DIAGNOSIS — I10 Essential (primary) hypertension: Secondary | ICD-10-CM

## 2013-08-18 LAB — BASIC METABOLIC PANEL
BUN: 18 mg/dL (ref 6–23)
CHLORIDE: 102 meq/L (ref 96–112)
CO2: 29 mEq/L (ref 19–32)
CREATININE: 0.81 mg/dL (ref 0.50–1.10)
Calcium: 9.3 mg/dL (ref 8.4–10.5)
GLUCOSE: 89 mg/dL (ref 70–99)
Potassium: 4.1 mEq/L (ref 3.5–5.3)
Sodium: 139 mEq/L (ref 135–145)

## 2013-08-18 LAB — HEMOGLOBIN A1C
Hgb A1c MFr Bld: 5.6 % (ref <5.7)
Mean Plasma Glucose: 114 mg/dL (ref <117)

## 2013-08-18 MED ORDER — AMLODIPINE BESYLATE 5 MG PO TABS: 5.0000 mg | ORAL_TABLET | Freq: Every day | ORAL | Status: DC

## 2013-08-18 NOTE — Patient Instructions (Signed)
Good to see you today!  I am sorry for your recent troubles.  Please let me know if you do not come through your mourning ok, or if you need any help for depression.    Add norvasc (amlodipine) 5 mg to your BP regimen.   Please give your treadmill another try- I think that your soreness will likely resolve in 2 or 3 weeks.    I will be in touch with your labs and we can plan your next visit.  Assuming all looks ok let's plan to recheck in 2 months or so

## 2013-08-18 NOTE — Progress Notes (Signed)
[facility] [address], [place] [address] [phone]  Date:  08/18/2013   Name:  Samantha RanchJanice M Pearson   DOB:  03-May-1960   MRN:  [id number]  PCP:  Tally DueGUEST, CHRIS WARREN, MD    Chief Complaint: Follow-up   History of Present Illness:  Samantha Pearson is a 53 y.o. very pleasant female patient who presents with the following:  Here today for a BP check.  Last seen in April to check her BP and discus insomnia and weight gain.   Wt Readings from Last 3 Encounters:  08/18/13 220 lb 12.8 oz (100.154 kg)  05/19/13 217 lb 3.2 oz (98.521 kg)  12/27/12 217 lb (98.431 kg)   She states things are "about the same" with her job.  She did not start the xanax rx that we gave her in April/  She states that "sleeping has been ok."  She will notice that if she gets stressed at the end of her shift she will have a harder time falling asleep.  One of her dogs was sick and had to be destroyed recently.  This has hit her pretty hard, and she had a large vet bill to content with.    She has increased her K intake to counteract her HCTZ, but would also like to be checked for DM.  She did try to exercise on her treadmill a couple of times, but noted that it made her body and joints feel sore.  She was not sure if she should continue to try  Patient Active Problem List   Diagnosis Date Noted  . Stress 05/22/2011  . ESSENTIAL HYPERTENSION, BENIGN 08/07/2008  . CHEST PAIN 08/07/2008    Past Medical History  Diagnosis Date  . Chest pain 08/03/08    Urgent care visit  . Hypertension   . Anxiety   . Edema   . Dyspnea   . Obesity   . Allergy   . Depression     Past Surgical History  Procedure Laterality Date  . Cesarean section      History  Substance Use Topics  . Smoking status: Never Smoker   . Smokeless tobacco: Not on file     Comment: Nonsmoker  . Alcohol Use: No    Family History  Problem Relation Age of Onset  . Heart failure Father     CHF    Allergies   Allergen Reactions  . Doxycycline Hives, Itching and Swelling  . Septra [Bactrim] Hives, Itching and Swelling  . Latex Swelling and Rash    Medication list has been reviewed and updated.  Current Outpatient Prescriptions on File Prior to Visit  Medication Sig Dispense Refill  . BLACK COHOSH EXTRACT PO Take by mouth daily.      . Calcium Carbonate-Vitamin D (CALCIUM + D PO) Take by mouth daily.      . Cholecalciferol 2000 UNITS TABS Take 2,000 Units by mouth daily.       . fish oil-omega-3 fatty acids 1000 MG capsule Take 1 g by mouth daily.        . Flaxseed, Linseed, (FLAXSEED OIL) 1000 MG CAPS Take by mouth daily.      Marland Kitchen. lisinopril-hydrochlorothiazide (PRINZIDE,ZESTORETIC) 20-25 MG per tablet Take 1 tablet by mouth daily.  90 tablet  0  . magnesium 30 MG tablet Take 30 mg by mouth 2 (two) times daily.      Marland Kitchen. ALPRAZolam (XANAX) 0.5 MG tablet Take 1 tablet (0.5 mg total)  by mouth at bedtime as needed for anxiety. Take 1/2 or 1 as needed  30 tablet  2  . amoxicillin-clavulanate (AUGMENTIN) 875-125 MG per tablet Take 1 tablet by mouth 2 (two) times daily.  20 tablet  0  . [place]-Glucosamine-Vit D (GLUCOSAMINE COMPLEX PO) Take by mouth.      . cetirizine (ZYRTEC) 10 MG tablet Take 10 mg by mouth daily.      Marland Kitchen ipratropium (ATROVENT) 0.03 % nasal spray Place 2 sprays into the nose 2 (two) times daily.  30 mL  5   No current facility-administered medications on file prior to visit.    Review of Systems:  As per HPI- otherwise negative.   Physical Examination: Filed Vitals:   08/18/13 1049  BP: 162/98  Pulse: 69  Temp: 98.1 F (36.7 C)  Resp: 16   Filed Vitals:   08/18/13 1049  Height: 4\' 11"  (1.499 m)  Weight: 220 lb 12.8 oz (100.154 kg)   Body mass index is 44.57 kg/(m^2). Ideal Body Weight: Weight in (lb) to have BMI = 25: 123.5  GEN: WDWN, NAD, Non-toxic, A & O x 3, obese, well appearing  HEENT: Atraumatic, Normocephalic. Neck supple. No masses, No LAD. Ears and  Nose: No external deformity. CV: RRR, No M/G/R. No JVD. No thrill. No extra heart sounds. PULM: CTA B, no wheezes, crackles, rhonchi. No retractions. No resp. distress. No accessory muscle use.Marland Kitchen EXTR: No c/c/e NEURO Normal gait.  PSYCH: Normally interactive. Conversant. Upset and tearful when discussing the recent passing of her dog.      Assessment and Plan: Essential hypertension - Plan: amLODipine (NORVASC) 5 MG tablet, Basic metabolic panel  Elevated glucose - Plan: Hemoglobin A1c  Obesity, unspecified  Adjustment disorder with depressed mood   Uncontrolled HTN.  Will add norvasc 5mg  to her regimen.  Check A1c.  Reassured that her current mourning is normal, but asked her to let me know if she develops protracted sx.   See patient instructions for more details.     Signed Abbe Amsterdam, MD

## 2013-08-19 ENCOUNTER — Encounter: Payer: Self-pay | Admitting: Family Medicine

## 2013-12-08 ENCOUNTER — Encounter: Payer: Self-pay | Admitting: Family Medicine

## 2013-12-08 ENCOUNTER — Ambulatory Visit (INDEPENDENT_AMBULATORY_CARE_PROVIDER_SITE_OTHER): Payer: 59 | Admitting: Family Medicine

## 2013-12-08 VITALS — BP 130/78 | HR 70 | Temp 98.6°F | Resp 16 | Ht 60.0 in | Wt 221.6 lb

## 2013-12-08 DIAGNOSIS — Z1211 Encounter for screening for malignant neoplasm of colon: Secondary | ICD-10-CM

## 2013-12-08 DIAGNOSIS — I10 Essential (primary) hypertension: Secondary | ICD-10-CM

## 2013-12-08 DIAGNOSIS — E669 Obesity, unspecified: Secondary | ICD-10-CM

## 2013-12-08 DIAGNOSIS — R7309 Other abnormal glucose: Secondary | ICD-10-CM

## 2013-12-08 LAB — BASIC METABOLIC PANEL
BUN: 8 mg/dL (ref 6–23)
CALCIUM: 9.7 mg/dL (ref 8.4–10.5)
CHLORIDE: 102 meq/L (ref 96–112)
CO2: 27 meq/L (ref 19–32)
Creat: 0.78 mg/dL (ref 0.50–1.10)
Glucose, Bld: 89 mg/dL (ref 70–99)
Potassium: 4 mEq/L (ref 3.5–5.3)
SODIUM: 140 meq/L (ref 135–145)

## 2013-12-08 MED ORDER — LISINOPRIL 20 MG PO TABS: 20.0000 mg | ORAL_TABLET | Freq: Every day | ORAL | Status: DC

## 2013-12-08 MED ORDER — AMLODIPINE BESYLATE 10 MG PO TABS: 5.0000 mg | ORAL_TABLET | Freq: Every day | ORAL | Status: DC

## 2013-12-08 NOTE — Progress Notes (Signed)
Subjective:    Patient ID: Samantha Pearson, female    DOB: 11/05/1960, 53 y.o.   MRN: [id number]  Tally DueGUEST, CHRIS WARREN, MD  Chief Complaint  Patient presents with  . Medication Refill  . Hypertension   Patient Active Problem List   Diagnosis Date Noted  . Obesity, unspecified 08/18/2013  . Stress 05/22/2011  . ESSENTIAL HYPERTENSION, BENIGN 08/07/2008  . CHEST PAIN 08/07/2008   Prior to Admission medications   Medication Sig Start Date End Date Taking? Authorizing Provider  amLODipine (NORVASC) 10 MG tablet Take 0.5 tablets (5 mg total) by mouth daily. 12/08/13  Yes Josetta Wigal, PA  BLACK COHOSH EXTRACT PO Take by mouth daily.   Yes Historical Provider, MD  Calcium Carbonate-Vitamin D (CALCIUM + D PO) Take by mouth daily.   Yes Historical Provider, MD  Flaxseed, Linseed, (FLAXSEED OIL) 1000 MG CAPS Take by mouth daily.   Yes Historical Provider, MD  magnesium 30 MG tablet Take 30 mg by mouth 2 (two) times daily.   Yes Historical Provider, MD  Multiple Vitamin (MULTIVITAMIN) tablet Take 1 tablet by mouth daily.   Yes Historical Provider, MD  OVER THE COUNTER MEDICATION OTC Vitamin  B12 500 mg taking daily   Yes Historical Provider, MD  ALPRAZolam (XANAX) 0.5 MG tablet Take 1 tablet (0.5 mg total) by mouth at bedtime as needed for anxiety. Take 1/2 or 1 as needed 05/19/13   Gwenlyn FoundJessica C Copland, MD  ipratropium (ATROVENT) 0.03 % nasal spray Place 2 sprays into the nose 2 (two) times daily. 06/27/12   Heather Jaquita RectorM Marte, PA-C  lisinopril (PRINIVIL,ZESTRIL) 20 MG tablet Take 1 tablet (20 mg total) by mouth daily. 12/08/13   Raelyn Ensignodd Franshesca Chipman, PA   Medications, allergies, past medical history, surgical history, family history, social history and problem list reviewed and updated.  HPI  [age] yof with PMH htn, obesity, and insomnia presents today for routine 3 month f/u.  Doing well since last visit with us. She has switched from midnight shift to swing shifts where she works all 3 shifts during a week.  She also has a 2nd job part-time. She is having a hard time finding time to exercise with this schedule. Still has treadmill at home and is ready to start attempting to exercise again. She has been watching her diet a lot more lately. Eating more fruit and healthy snacks. Also eating earlier in the day which she thinks is helping her to sleep. Unfortunately no weight loss yet.   Her insomnia is much improved since last visit, even with the swing shifts. She has not tried the xanax at all since the summer.  Taking both lisn/hctz and amlodipine as prescribed. Taking BP at home and running 130s/70s. Occasionally dizzy. No syncope though.   Due for flu vaccine, and colonoscopy. Gets her mammograms through her gyn.   Review of Systems No chest pain, SOB, HA, dizziness, vision change, N/V, diarrhea, constipation, dysuria, urinary urgency or frequency, myalgias, arthralgias or rash.     Objective:   Physical Exam  Constitutional: She is oriented to person, place, and time. [name].  BP 130/78 mmHg  Pulse 70  Temp(Src) 98.6 F (37 C) (Oral)  Resp 16  Ht 5' (1.524 m)  Wt 221 lb 9.6 oz (100.517 kg)  BMI 43.28 kg/m2  SpO2 99%  LMP 05/25/2012   Cardiovascular: Normal rate, regular rhythm, S1 normal and S2 normal.  PMI is not displaced.  Exam reveals no gallop.  Murmur heard.  Systolic murmur is present with a grade of 2/6  2/6 systolic ejection murmur loudest at RSB.   Pulmonary/Chest: Effort normal and breath sounds normal. She has no decreased breath sounds. She has no wheezes. She has no rhonchi. She has no rales.  Musculoskeletal:       Right lower leg: She exhibits no edema.       Left lower leg: She exhibits no edema.  Neurological: She is alert and oriented to person, place, and time.  Psychiatric: She has a normal mood and affect. Her speech is normal.      Assessment & Plan:   [age] yof with PMH htn, obesity, and insomnia presents today for  routine 3 month f/u.  Essential hypertension - Plan: Basic metabolic panel, amLODipine (NORVASC) 10 MG tablet --Well controlled on current regimen but pt experiencing occas dizziness --DC lisin/hctz combo, start lisinopril 20mg  qd. Increase amlodipine to 10mg  qd --Continue to monitor at home, if running lower than 120/80, can discuss DC lisinopril --BMP today  Elevated glucose - Plan: Basic metabolic panel --AIC 5.6% last visit  Encounter for screening colonoscopy - Plan: Ambulatory referral to Gastroenterology --Referral for colonoscopy  Obesity -  --Encouraged increasing slowly to 30 minutes aerobic exercise most days of week --Encouraged continuing healthier diet choices  Donnajean Lopesodd M. Charlen Bakula, PA-C Physician Assistant-Certified [facility]  12/08/2013 8:58 AM

## 2013-12-08 NOTE — Patient Instructions (Signed)
Your BP looks great today!  If you would like, let's have you change to lisinopril 20 mg (take away the HCTZ) and increase norvasc to 10 mg. If you find that your BP is running around 120/80 or less, we can then try stopping the lisinopril.  Please come and see me in about 3 months.  Take care and do work on trying to exercise

## 2013-12-09 ENCOUNTER — Encounter: Payer: Self-pay | Admitting: Internal Medicine

## 2013-12-23 ENCOUNTER — Ambulatory Visit (INDEPENDENT_AMBULATORY_CARE_PROVIDER_SITE_OTHER): Payer: 59 | Admitting: Family Medicine

## 2013-12-23 VITALS — BP 140/75 | HR 70 | Temp 98.9°F | Resp 18 | Ht 59.25 in | Wt 224.4 lb

## 2013-12-23 DIAGNOSIS — L0231 Cutaneous abscess of buttock: Secondary | ICD-10-CM

## 2013-12-23 DIAGNOSIS — R509 Fever, unspecified: Secondary | ICD-10-CM

## 2013-12-23 LAB — POCT INFLUENZA A/B
INFLUENZA B, POC: NEGATIVE
Influenza A, POC: NEGATIVE

## 2013-12-23 LAB — POCT CBC
Granulocyte percent: 75.7 %G (ref 37–80)
HCT, POC: 38.2 % (ref 37.7–47.9)
HEMOGLOBIN: 11.6 g/dL — AB (ref 12.2–16.2)
Lymph, poc: 2.5 (ref 0.6–3.4)
MCH, POC: 26.4 pg — AB (ref 27–31.2)
MCHC: 30.3 g/dL — AB (ref 31.8–35.4)
MCV: 87.3 fL (ref 80–97)
MID (cbc): 0.7 (ref 0–0.9)
MPV: 8.2 fL (ref 0–99.8)
POC Granulocyte: 10 — AB (ref 2–6.9)
POC LYMPH PERCENT: 19 %L (ref 10–50)
POC MID %: 5.3 % (ref 0–12)
Platelet Count, POC: 231 10*3/uL (ref 142–424)
RBC: 4.38 M/uL (ref 4.04–5.48)
RDW, POC: 15.7 %
WBC: 13.2 10*3/uL — AB (ref 4.6–10.2)

## 2013-12-23 MED ORDER — IBUPROFEN 200 MG PO TABS: 400.0000 mg | ORAL_TABLET | Freq: Once | ORAL | Status: DC

## 2013-12-23 MED ORDER — CEFTRIAXONE SODIUM 1 G IJ SOLR
1.0000 g | Freq: Once | INTRAMUSCULAR | Status: AC
  Administered 2013-12-23: 1 g via INTRAMUSCULAR

## 2013-12-23 MED ORDER — CLINDAMYCIN HCL 300 MG PO CAPS: 300.0000 mg | ORAL_CAPSULE | Freq: Four times a day (QID) | ORAL | Status: DC

## 2013-12-23 NOTE — Progress Notes (Signed)
[facility] [address], [place] [address] [phone]  Date:  12/23/2013   Name:  Samantha Pearson   DOB:  1960-11-13   MRN:  [id number]  PCP:  Tally DueGUEST, CHRIS WARREN, MD    Chief Complaint: Recurrent Skin Infections   History of Present Illness:  Samantha Pearson is a 53 y.o. very pleasant female patient who presents with the following:  Here today with an abscess on her buttock.  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  She has noted a likely boil for about one week. She at first thought it might be a bug bite of some kind.  Then a few days later a bump developed which has progressed.  It is now more painful, and has gotten larger.   She was not aware of a fever.  She has felt achy, tired, chilled.  She has been exposed to illness at her job.  Not sure if she was exposed to the flu.  She noted onset of sx over the last 5 days with stuffy nose, cough.  She does have some discolored nasal drainage, and cough can be productive.  Mild scratchy throat, ears feel itchy.   No GI symptoms.       Patient Active Problem List   Diagnosis Date Noted  . Obesity, unspecified 08/18/2013  . Stress 05/22/2011  . ESSENTIAL HYPERTENSION, BENIGN 08/07/2008  . CHEST PAIN 08/07/2008    Past Medical History  Diagnosis Date  . Chest pain 08/03/08    Urgent care visit  . Hypertension   . Anxiety   . Edema   . Dyspnea   . Obesity   . Allergy   . Depression     Past Surgical History  Procedure Laterality Date  . Cesarean section      History  Substance Use Topics  . Smoking status: Never Smoker   . Smokeless tobacco: Not on file     Comment: Nonsmoker  . Alcohol Use: No    Family History  Problem Relation Age of Onset  . Heart failure Father     CHF    Allergies  Allergen Reactions  . Doxycycline Hives, Itching and Swelling  . Septra [Bactrim] Hives, Itching and Swelling  . Latex Swelling and Rash    Medication list has been reviewed  and updated.  Current Outpatient Prescriptions on File Prior to Visit  Medication Sig Dispense Refill  . amLODipine (NORVASC) 10 MG tablet Take 0.5 tablets (5 mg total) by mouth daily. 30 tablet 5  . BLACK COHOSH EXTRACT PO Take by mouth daily.    . Calcium Carbonate-Vitamin D (CALCIUM + D PO) Take by mouth daily.    . Flaxseed, Linseed, (FLAXSEED OIL) 1000 MG CAPS Take by mouth daily.    Marland Kitchen. lisinopril (PRINIVIL,ZESTRIL) 20 MG tablet Take 1 tablet (20 mg total) by mouth daily. 30 tablet 5  . magnesium 30 MG tablet Take 30 mg by mouth 2 (two) times daily.    . Multiple Vitamin (MULTIVITAMIN) tablet Take 1 tablet by mouth daily.    Marland Kitchen. OVER THE COUNTER MEDICATION OTC Vitamin  B12 500 mg taking daily    . ALPRAZolam (XANAX) 0.5 MG tablet Take 1 tablet (0.5 mg total) by mouth at bedtime as needed for anxiety. Take 1/2 or 1 as needed 30 tablet 2  . ipratropium (ATROVENT) 0.03 % nasal spray Place 2 sprays into the nose 2 (two) times daily. 30 mL 5  No current facility-administered medications on file prior to visit.    Review of Systems:  As per HPI- otherwise negative.   Physical Examination: Filed Vitals:   12/23/13 0848  BP: 152/94  Pulse: 107  Temp: 100.1 F (37.8 C)  Resp: 18   Filed Vitals:   12/23/13 0848  Height: 4' 11.25" (1.505 m)  Weight: 224 lb 6.4 oz (101.787 kg)   Body mass index is 44.94 kg/(m^2). Ideal Body Weight: Weight in (lb) to have BMI = 25: 124.6  GEN: WDWN, NAD, Non-toxic, A & O x 3, obese, looks well HEENT: Atraumatic, Normocephalic. Neck supple. No masses, No LAD.  Bilateral TM wnl, oropharynx normal.  PEERL,EOMI.   Ears and Nose: No external deformity. CV: RRR, No M/G/R. No JVD. No thrill. No extra heart sounds. PULM: CTA B, no wheezes, crackles, rhonchi. No retractions. No resp. distress. No accessory muscle use. ABD: S, NT, ND, +BS. No rebound. No HSM. EXTR: No c/c/e NEURO Normal gait.  PSYCH: Normally interactive. Conversant. Not depressed or  anxious appearing.  Calm demeanor.  Right buttock: there is a large tender and red area with slight fluctuance towards the gluteal cleft.    VC obtained.  Area prepped with betadine and anesthesai with 5ml of 1% lidocaine. I and D with 11 blade and pus expressed. packied with about 6 inches of 1/4in packing and dressed  Given 1 gm of rocephin, 400mg  of ibuprofen  Results for orders placed or performed in visit on 12/23/13  POCT CBC  Result Value Ref Range   WBC 13.2 (A) 4.6 - 10.2 K/uL   Lymph, poc 2.5 0.6 - 3.4   POC LYMPH PERCENT 19.0 10 - 50 %L   MID (cbc) 0.7 0 - 0.9   POC MID % 5.3 0 - 12 %M   POC Granulocyte 10.0 (A) 2 - 6.9   Granulocyte percent 75.7 37 - 80 %G   RBC 4.38 4.04 - 5.48 M/uL   Hemoglobin 11.6 (A) 12.2 - 16.2 g/dL   HCT, POC 96.0 45.4 - 47.9 %   MCV 87.3 80 - 97 fL   MCH, POC 26.4 (A) 27 - 31.2 pg   MCHC 30.3 (A) 31.8 - 35.4 g/dL   RDW, POC 09.8 %   Platelet Count, POC 231 142 - 424 K/uL   MPV 8.2 0 - 99.8 fL  POCT Influenza A/B  Result Value Ref Range   Influenza A, POC Negative    Influenza B, POC Negative     Assessment and Plan: Abscess of right buttock - Plan: Wound culture, POCT CBC, cefTRIAXone (ROCEPHIN) injection 1 g, clindamycin (CLEOCIN) 300 MG capsule  Other specified fever - Plan: POCT CBC, POCT Influenza A/B, clindamycin (CLEOCIN) 300 MG capsule  Here today with low grade fever and abscess on there right buttock. Suspect MRSA so will start tx with clindamycin.  Await culture, she will come in tomorrow for a recheck.  Her VS normalized and she felt better after treatment.  Do not suspect that she is septic but cautioned her to seek care right away if she starts feeling worse  Meds ordered this encounter  Medications  . cefTRIAXone (ROCEPHIN) injection 1 g    Sig:     Order Specific Question:  Antibiotic Indication:    Answer:  Cellulitis  . clindamycin (CLEOCIN) 300 MG capsule    Sig: Take 1 capsule (300 mg total) by mouth 4 (four) times  daily.    Dispense:  40 capsule  Refill:  0  . ibuprofen (ADVIL,MOTRIN) tablet 400 mg    Sig:      Signed Abbe AmsterdamJessica Vansh Reckart, MD

## 2013-12-23 NOTE — Patient Instructions (Addendum)
Please come and see us tomorrow for a wound check  Fast pass with any PA for a wound check 11/17.  Take the clindamycin (antibiotic) as directed.  I will send out your wound culture and let you know the restuls Rest, drink plenty of water.  If you are feeling worse please let us know! Use ibuprofen and/ or tylenol for your fever and other symptoms.  Just leave the bandage on your wound tonight.  Tomorrow before you come in you can take a shower and pat dry We will change out the bandage for you tomorrow

## 2013-12-24 ENCOUNTER — Ambulatory Visit (INDEPENDENT_AMBULATORY_CARE_PROVIDER_SITE_OTHER): Payer: 59 | Admitting: Physician Assistant

## 2013-12-24 VITALS — BP 154/80 | HR 77 | Temp 98.5°F | Resp 18 | Ht 59.0 in | Wt 224.6 lb

## 2013-12-24 DIAGNOSIS — L0231 Cutaneous abscess of buttock: Secondary | ICD-10-CM

## 2013-12-24 NOTE — Patient Instructions (Signed)
Continue the antibiotic as prescribed. Apply a warm compress to the area for 15-20 minutes 2-4 times each day. Change the dressing if it becomes saturated, leaks or falls off. Stay off your bottom as much as possible. Lying on your stomach is the best position for this.

## 2013-12-25 ENCOUNTER — Ambulatory Visit: Payer: 59

## 2013-12-25 ENCOUNTER — Other Ambulatory Visit: Payer: Self-pay | Admitting: Family Medicine

## 2013-12-25 DIAGNOSIS — L0231 Cutaneous abscess of buttock: Secondary | ICD-10-CM

## 2013-12-25 NOTE — Progress Notes (Signed)
Pt came in today for a wound check,  She is feeling overall better.  She has noted some loose stools but no diarrhea.   She has not noted a fever.  T 98.4, P 71, R 18,  BP 164/84  Right buttock abscess continues to look better, redness and heat are reduced and tenderness is less. Removed packing and re-packed with approx 4 inches of 1/4 packing and dressed with hypafix.   She will come back for a recheck tomorrow or Saturday depending on her progress. She will decrease her abx to TID. She is on clindamycin, unsure why this does not appear on her med list.    Of note this note was written in an orders only encounter because a computer system problem [facility].  No charge

## 2013-12-26 LAB — WOUND CULTURE: GRAM STAIN: NONE SEEN

## 2013-12-26 NOTE — Progress Notes (Signed)
   Subjective:    Patient ID: Samantha Pearson, female    DOB: 1960-08-08, 53 y.o.   MRN: [id number]   PCP: Tally DueGUEST, CHRIS WARREN, MD  Chief Complaint  Patient presents with  . Wound Check    Allergies  Allergen Reactions  . Doxycycline Hives, Itching and Swelling  . Septra [Bactrim] Hives, Itching and Swelling  . Latex Swelling and Rash    Patient Active Problem List   Diagnosis Date Noted  . Obesity, unspecified 08/18/2013  . Stress 05/22/2011  . ESSENTIAL HYPERTENSION, BENIGN 08/07/2008  . CHEST PAIN 08/07/2008    Prior to Admission medications   Medication Sig Start Date End Date Taking? Authorizing Provider  amLODipine (NORVASC) 10 MG tablet Take 0.5 tablets (5 mg total) by mouth daily. 12/08/13  Yes Todd McVeigh, PA  BLACK COHOSH EXTRACT PO Take by mouth daily.   Yes Historical Provider, MD  Calcium Carbonate-Vitamin D (CALCIUM + D PO) Take by mouth daily.   Yes Historical Provider, MD  lisinopril (PRINIVIL,ZESTRIL) 20 MG tablet Take 1 tablet (20 mg total) by mouth daily. 12/08/13  Yes Todd McVeigh, PA  magnesium 30 MG tablet Take 30 mg by mouth 2 (two) times daily.   Yes Historical Provider, MD  Multiple Vitamin (MULTIVITAMIN) tablet Take 1 tablet by mouth daily.   Yes Historical Provider, MD  OVER THE COUNTER MEDICATION OTC Vitamin  B12 500 mg taking daily   Yes Historical Provider, MD    Medical, Surgical, Family and Social History reviewed and updated.  HPI  Presents for wound care, s/p I&D of buttock abscess on 12/23/2013. She is tolerating clindamycin well, and while the area remains quite tender, she thinks it's a little bit better.  Review of Systems     Objective:   Physical Exam  BP 154/80 mmHg  Pulse 77  Temp(Src) 98.5 F (36.9 C) (Oral)  Resp 18  Ht 4\' 11"  (1.499 m)  Wt 224 lb 9.6 oz (101.878 kg)  BMI 45.34 kg/m2  SpO2 98%  LMP 05/25/2012 WDWNBF, A&O x 3. Dressing and packing removed from the RIGHT buttock, adjacent to the intergluteal cleft.  Considerable induration remains. No purulence expressed. Wound bed with some eschar, while was removed easily with pick ups. Packed with 1/4 inch plain packing and dressing applied.      Assessment & Plan:  1. Abscess of right buttock Continue clindamycin. Warm compresses to the site. Avoid sitting, lying supine whenever possible. RTC 2 days, sooner if needed.   Fernande Brashelle S. Marisella Puccio, PA-C Physician Assistant-Certified [facility]

## 2013-12-27 ENCOUNTER — Ambulatory Visit (INDEPENDENT_AMBULATORY_CARE_PROVIDER_SITE_OTHER): Payer: 59 | Admitting: Family Medicine

## 2013-12-27 VITALS — BP 164/76 | HR 87 | Temp 98.3°F | Resp 16 | Ht 59.0 in | Wt 225.0 lb

## 2013-12-27 DIAGNOSIS — L0231 Cutaneous abscess of buttock: Secondary | ICD-10-CM

## 2013-12-27 NOTE — Patient Instructions (Signed)
Come back tomorrow for dressing change. Then you will come back in 2 days after that to have your packing removed. Continue with your antibiotic.

## 2013-12-27 NOTE — Progress Notes (Signed)
Subjective:    Patient ID: Samantha Pearson, female    DOB: 1960/03/11, 53 y.o.   MRN: [id number] Patient Active Problem List   Diagnosis Date Noted  . Obesity, unspecified 08/18/2013  . Stress 05/22/2011  . ESSENTIAL HYPERTENSION, BENIGN 08/07/2008  . CHEST PAIN 08/07/2008   Prior to Admission medications   Medication Sig Start Date End Date Taking? Authorizing Provider  amLODipine (NORVASC) 10 MG tablet Take 0.5 tablets (5 mg total) by mouth daily. 12/08/13  Yes Todd McVeigh, PA  BLACK COHOSH EXTRACT PO Take by mouth daily.   Yes Historical Provider, MD  Calcium Carbonate-Vitamin D (CALCIUM + D PO) Take by mouth daily.   Yes Historical Provider, MD  lisinopril (PRINIVIL,ZESTRIL) 20 MG tablet Take 1 tablet (20 mg total) by mouth daily. 12/08/13  Yes Todd McVeigh, PA  magnesium 30 MG tablet Take 30 mg by mouth 2 (two) times daily.   Yes Historical Provider, MD  Multiple Vitamin (MULTIVITAMIN) tablet Take 1 tablet by mouth daily.   Yes Historical Provider, MD  OVER THE COUNTER MEDICATION OTC Vitamin  B12 500 mg taking daily   Yes Historical Provider, MD   Allergies  Allergen Reactions  . Doxycycline Hives, Itching and Swelling  . Septra [Bactrim] Hives, Itching and Swelling  . Latex Swelling and Rash   HPI  This is a 53 year old female presenting for wound check s/p right buttock abscess on 12/23/13. She was seen on 11/18 for wound check and no additional purulence noted. Her culture did not grow out significant bacteria. She has been taking clindamycin - at last visit her dosing was decreased from QID to TID d/t loose stools. She continues to have increased stools three times a day but not as loose. She is not having any bloody stools. She denies fever, chills. The area on her buttock is less tender. She notes she is unable to reach the area at home for dressing changes and does not have anyone to help her. She is wearing pads in her underwear but still getting drainage on her  clothes.  Review of Systems  Constitutional: Negative for fever and chills.  Gastrointestinal: Negative for nausea, vomiting and diarrhea.  Skin: Positive for wound.      Objective:   Physical Exam  Constitutional: She is oriented to person, place, and time. [name]. No distress.  HENT:  Head: Normocephalic and atraumatic.  Right Ear: Hearing normal.  Left Ear: Hearing normal.  Nose: Nose normal.  Eyes: Conjunctivae and lids are normal. Right eye exhibits no discharge. Left eye exhibits no discharge. No scleral icterus.  Pulmonary/Chest: Effort normal. No respiratory distress.  Musculoskeletal: Normal range of motion.  Neurological: She is alert and oriented to person, place, and time.  Skin: Skin is warm and dry.  Dressing and packing removed. Area of induration 3x2 inches. Wound cavity 2 cm, wound incision 1 cm. Wound was irrigated with 5 cc 1% lido. No additional purulence. Cavity was loosely packed with 1/4 inch packing. Wound dressed and wound care discussed.  Psychiatric: She has a normal mood and affect. Her speech is normal and behavior is normal. Thought content normal.   BP 164/76 mmHg  Pulse 87  Temp(Src) 98.3 F (36.8 C)  Resp 16  Ht 4\' 11"  (1.499 m)  Wt 225 lb (102.059 kg)  BMI 45.42 kg/m2  SpO2 97%  LMP 05/25/2012     Assessment & Plan:  1. Abscess of right buttock No additional purulence  expressed. Since clindamycin has caused some increased loose stools and no significant bacteria was grown on culture, will cut back dosing to BID. She will continue this dose for a total of 10 days of treatment. She is having a hard time reaching the area for dressing changes - she will return tomorrow for dressing change and will pull some of the packing out.  Roswell MinersNicole V. Dyke BrackettBush, PA-C, M[facility]  12/27/2013

## 2013-12-28 ENCOUNTER — Ambulatory Visit (INDEPENDENT_AMBULATORY_CARE_PROVIDER_SITE_OTHER): Payer: 59 | Admitting: Physician Assistant

## 2013-12-28 VITALS — BP 144/86 | HR 86 | Temp 97.6°F | Resp 16 | Ht 59.0 in | Wt 225.6 lb

## 2013-12-28 DIAGNOSIS — L0231 Cutaneous abscess of buttock: Secondary | ICD-10-CM

## 2013-12-28 DIAGNOSIS — L292 Pruritus vulvae: Secondary | ICD-10-CM

## 2013-12-28 MED ORDER — FLUCONAZOLE 150 MG PO TABS: 150.0000 mg | ORAL_TABLET | Freq: Once | ORAL | Status: DC

## 2013-12-28 NOTE — Progress Notes (Signed)
The patient was discussed with me and I agree with the diagnosis and treatment plan.  

## 2013-12-28 NOTE — Progress Notes (Signed)
Subjective:    Patient ID: Samantha RanchJanice M Nolte, female    DOB: October 14, 1960, 53 y.o.   MRN: [id number] Patient Active Problem List   Diagnosis Date Noted  . Obesity, unspecified 08/18/2013  . Stress 05/22/2011  . ESSENTIAL HYPERTENSION, BENIGN 08/07/2008  . CHEST PAIN 08/07/2008   Home medications   Medication Sig Start Date End Date Taking? Authorizing Provider  amLODipine (NORVASC) 10 MG tablet Take 0.5 tablets (5 mg total) by mouth daily. 12/08/13  Yes Todd McVeigh, PA  BLACK COHOSH EXTRACT PO Take by mouth daily.   Yes Historical Provider, MD  Calcium Carbonate-Vitamin D (CALCIUM + D PO) Take by mouth daily.   Yes Historical Provider, MD  lisinopril (PRINIVIL,ZESTRIL) 20 MG tablet Take 1 tablet (20 mg total) by mouth daily. 12/08/13  Yes Todd McVeigh, PA  magnesium 30 MG tablet Take 30 mg by mouth 2 (two) times daily.   Yes Historical Provider, MD  Multiple Vitamin (MULTIVITAMIN) tablet Take 1 tablet by mouth daily.   Yes Historical Provider, MD  OVER THE COUNTER MEDICATION OTC Vitamin  B12 500 mg taking daily   Yes Historical Provider, MD  fluconazole (DIFLUCAN) 150 MG tablet Take 1 tablet (150 mg total) by mouth once. Repeat if needed 12/28/13   Dorna LeitzNicole Bush V, PA-C   Allergies  Allergen Reactions  . Doxycycline Hives, Itching and Swelling  . Septra [Bactrim] Hives, Itching and Swelling  . Latex Swelling and Rash   HPI   This is a 53 year old female presenting for wound check and dressing change s/p right buttock abscess I&D on 12/23/13. She was seen here yesterday 12/27/13 and doing better. She is here again today because she noted at her visit yesterday that dressing changes are difficult for her and she has no one to help her. Her clindamycin dosing was decreased from TID to BID - she is having less frequent stools now. She reports having some vulvar itching in the past day. She denies fever, chills, N/V.  Review of Systems  Constitutional: Negative for fever and chills.    Gastrointestinal: Negative for nausea and vomiting.  Skin: Positive for wound.      Objective:   Physical Exam  Constitutional: She is oriented to person, place, and time. [name]. No distress.  HENT:  Head: Normocephalic and atraumatic.  Right Ear: Hearing normal.  Left Ear: Hearing normal.  Nose: Nose normal.  Eyes: Conjunctivae and lids are normal. Right eye exhibits no discharge. Left eye exhibits no discharge. No scleral icterus.  Pulmonary/Chest: Effort normal. No respiratory distress.  Musculoskeletal: Normal range of motion.  Neurological: She is alert and oriented to person, place, and time.  Skin: Skin is warm and dry.  Area of induration 3x2 inches. No marked tenderness. 0.5 inch of packing removed but not removed completely. Dressing placed and wound care discussed.  Psychiatric: She has a normal mood and affect. Her speech is normal and behavior is normal. Thought content normal.      Assessment & Plan:  1. Vulvar itching 2. Abscess of right buttock Patient came in today for dressing change. 0.5 inch of packing was removed but packing left in place. Her stool frequency has decreased with decreased dose of clindamycin - will stay at this dose for full 10 day course. Prescribed diflucan for some vulvar itching noted in the past day. She will return in two days for wound check.   - fluconazole (DIFLUCAN) 150 MG tablet; Take 1 tablet (150  mg total) by mouth once. Repeat if needed  Dispense: 2 tablet; Refill: 0   Roswell MinersNicole V. Dyke BrackettBush, PA-C, M[facility]  12/28/2013

## 2013-12-28 NOTE — Patient Instructions (Signed)
Take 1 diflucan... May take another in 3 days if needed. Come back on Tuesday for recheck - I will be here from 9 am - 9 pm

## 2013-12-30 ENCOUNTER — Ambulatory Visit (INDEPENDENT_AMBULATORY_CARE_PROVIDER_SITE_OTHER): Payer: 59 | Admitting: Physician Assistant

## 2013-12-30 VITALS — BP 140/80 | HR 77 | Temp 98.3°F | Resp 16 | Ht 59.0 in | Wt 225.0 lb

## 2013-12-30 DIAGNOSIS — L0231 Cutaneous abscess of buttock: Secondary | ICD-10-CM

## 2013-12-30 NOTE — Progress Notes (Signed)
Subjective:    Patient ID: Samantha Pearson, female    DOB: [birth year], 53 y.o.   MRN: [id number]  HPI  This is a 53 year old female presenting for wound check and dressing change s/p right buttock abscess I&D on 12/23/13. She was last seen on 11/22 for a dressing change. She is on day 7 of clindamycin, decreased to BID. She will finish out 10 days of abx. At last visit she was prescribed diflucan for vulvar itching which has helped. She reports she has been changing her dressings several times a day. Her pain is decreased. She states yesterday she accidentally pulled out the packing. She denies fever, chills, N/V, bloody stool.   Review of Systems  Constitutional: Negative for fever and chills.  Gastrointestinal: Positive for diarrhea. Negative for nausea and vomiting.  Skin: Positive for wound.    Patient's social and family history were reviewed.  Patient Active Problem List   Diagnosis Date Noted  . Obesity, unspecified 08/18/2013  . Stress 05/22/2011  . ESSENTIAL HYPERTENSION, BENIGN 08/07/2008  . CHEST PAIN 08/07/2008   Home medications   Medication Sig Start Date End Date Taking? Authorizing Provider  amLODipine (NORVASC) 10 MG tablet Take 0.5 tablets (5 mg total) by mouth daily. 12/08/13  Yes Todd McVeigh, PA  BLACK COHOSH EXTRACT PO Take by mouth daily.   Yes Historical Provider, MD  Calcium Carbonate-Vitamin D (CALCIUM + D PO) Take by mouth daily.   Yes Historical Provider, MD  fluconazole (DIFLUCAN) 150 MG tablet Take 1 tablet (150 mg total) by mouth once. Repeat if needed 12/28/13  Yes Lanier ClamNicole Bush V, PA-C  lisinopril (PRINIVIL,ZESTRIL) 20 MG tablet Take 1 tablet (20 mg total) by mouth daily. 12/08/13  Yes Todd McVeigh, PA  magnesium 30 MG tablet Take 30 mg by mouth 2 (two) times daily.   Yes Historical Provider, MD  Multiple Vitamin (MULTIVITAMIN) tablet Take 1 tablet by mouth daily.   Yes Historical Provider, MD  OVER THE COUNTER MEDICATION OTC Vitamin  B12 500 mg taking  daily   Yes Historical Provider, MD   Allergies  Allergen Reactions  . Doxycycline Hives, Itching and Swelling  . Septra [Bactrim] Hives, Itching and Swelling  . Latex Swelling and Rash      Objective:   Physical Exam  Constitutional: She is oriented to person, place, and time. [name]. No distress.  HENT:  Head: Normocephalic and atraumatic.  Right Ear: Hearing normal.  Left Ear: Hearing normal.  Nose: Nose normal.  Eyes: Conjunctivae and lids are normal. Right eye exhibits no discharge. Left eye exhibits no discharge. No scleral icterus.  Pulmonary/Chest: Effort normal. No respiratory distress.  Musculoskeletal: Normal range of motion.  Neurological: She is alert and oriented to person, place, and time.  Skin: Skin is warm, dry and intact. No lesion and no rash noted.  Dressing removed, packing not in place. Induration reduced 3 inch x 1.5 inch. No additional purulence. Skin is warm to touch. Wound irrigated with 5 cc 1% lido. Wound cavity 1-1.5 cm, inicision 1 cm. Wound was loosely packed. Wound dressed and wound care discussed.  Psychiatric: She has a normal mood and affect. Her speech is normal and behavior is normal. Thought content normal.  BP 140/80 mmHg  Pulse 77  Temp(Src) 98.3 F (36.8 C) (Oral)  Resp 16  Ht 4\' 11"  (1.499 m)  Wt 225 lb (102.059 kg)  BMI 45.42 kg/m2  SpO2 99%  LMP 05/25/2012  Assessment & Plan:  1. Abscess of right buttock Patient has opted to come back for one last visit on 01/02/14. Loose packing will be removed then and if still doing well, that visit will be the last unless any problems arise. She will continue at least 3 more days of clindamycin. She will take additional diflucan pill if she has any further vulvar pruritus. Wound care discussed.  Roswell MinersNicole V. Dyke BrackettBush, PA-C, M[facility]  12/30/2013

## 2013-12-30 NOTE — Progress Notes (Signed)
The patient was discussed with me and I agree with the diagnosis and treatment plan.  

## 2014-01-02 ENCOUNTER — Ambulatory Visit (INDEPENDENT_AMBULATORY_CARE_PROVIDER_SITE_OTHER): Payer: 59 | Admitting: Physician Assistant

## 2014-01-02 VITALS — BP 158/80 | HR 82 | Temp 98.1°F | Resp 16

## 2014-01-02 DIAGNOSIS — L0231 Cutaneous abscess of buttock: Secondary | ICD-10-CM

## 2014-01-02 NOTE — Progress Notes (Signed)
I have discussed this case with Ms. Bush, PA-C and agree.  

## 2014-01-02 NOTE — Progress Notes (Signed)
Subjective:    Patient ID: Samantha RanchJanice M Clugston, female    DOB: 11/08/1960, 53 y.o.   MRN: [id number] Patient Active Problem List   Diagnosis Date Noted  . Obesity, unspecified 08/18/2013  . Stress 05/22/2011  . ESSENTIAL HYPERTENSION, BENIGN 08/07/2008  . CHEST PAIN 08/07/2008   Prior to Admission medications   Medication Sig Start Date End Date Taking? Authorizing Provider  amLODipine (NORVASC) 10 MG tablet Take 0.5 tablets (5 mg total) by mouth daily. 12/08/13  Yes Todd McVeigh, PA  BLACK COHOSH EXTRACT PO Take by mouth daily.   Yes Historical Provider, MD  Calcium Carbonate-Vitamin D (CALCIUM + D PO) Take by mouth daily.   Yes Historical Provider, MD  fluconazole (DIFLUCAN) 150 MG tablet Take 1 tablet (150 mg total) by mouth once. Repeat if needed 12/28/13  Yes Lanier ClamNicole Bush V, PA-C  lisinopril (PRINIVIL,ZESTRIL) 20 MG tablet Take 1 tablet (20 mg total) by mouth daily. 12/08/13  Yes Todd McVeigh, PA  magnesium 30 MG tablet Take 30 mg by mouth 2 (two) times daily.   Yes Historical Provider, MD  Multiple Vitamin (MULTIVITAMIN) tablet Take 1 tablet by mouth daily.   Yes Historical Provider, MD  OVER THE COUNTER MEDICATION OTC Vitamin  B12 500 mg taking daily   Yes Historical Provider, MD   Allergies  Allergen Reactions  . Doxycycline Hives, Itching and Swelling  . Septra [Bactrim] Hives, Itching and Swelling  . Latex Swelling and Rash    HPI  This is a 53 year old female presenting for wound check and dressing s/p right buttock abscess I&D on 12/23/13. She was last seen on 12/30/13 and doing much better. She opted to come back for one final wound check. The packing fell out yesterday. She is not having anymore drainage. Today is her last day of clindamycin and tolerating ok. She was prescribed two pills of diflucan - only had to take one. She denies fever, chills, N/V.  Review of Systems  Constitutional: Negative for fever and chills.  Gastrointestinal: Negative for nausea and vomiting.    Skin: Positive for wound.   Patient's social and family history were reviewed.     Objective:   Physical Exam  Constitutional: She is oriented to person, place, and time. [name]. No distress.  HENT:  Head: Normocephalic and atraumatic.  Right Ear: Hearing normal.  Left Ear: Hearing normal.  Nose: Nose normal.  Eyes: Conjunctivae and lids are normal. Right eye exhibits no discharge. Left eye exhibits no discharge. No scleral icterus.  Pulmonary/Chest: Effort normal. No respiratory distress.  Musculoskeletal: Normal range of motion.  Neurological: She is alert and oriented to person, place, and time.  Skin: Skin is warm, dry and intact. No lesion and no rash noted.  Dressing removed. Incision is closed, no packing in place. Induration is reduced to 1.5 x1 cm. Skin is slightly erythematous and peeling where the bandaid has been.   Psychiatric: She has a normal mood and affect. Her speech is normal and behavior is normal. Thought content normal.  BP 158/80 mmHg  Pulse 82  Temp(Src) 98.1 F (36.7 C) (Oral)  Resp 16  SpO2 100%  LMP 05/25/2012     Assessment & Plan:  1. Abscess of right buttock Incision is closed - no additional purulence or drainage. She does not need to be seen for this again. Wound was dressed. She does not need to keep dressing wound after today. She will take last dose of abx tonight.  She will return if any additional concerns.   Roswell MinersNicole V. Dyke BrackettBush, PA-C, [facility]  01/02/2014

## 2014-01-23 ENCOUNTER — Ambulatory Visit (INDEPENDENT_AMBULATORY_CARE_PROVIDER_SITE_OTHER): Payer: 59 | Admitting: Family Medicine

## 2014-01-23 VITALS — BP 126/72 | HR 79 | Temp 98.0°F | Resp 18 | Ht 60.0 in | Wt 225.0 lb

## 2014-01-23 DIAGNOSIS — I10 Essential (primary) hypertension: Secondary | ICD-10-CM

## 2014-01-23 NOTE — Patient Instructions (Signed)
Your blood pressure looks great Continue to take just the lisinopril 20mg  once a day for your blood pressure You can stop the amlodipine I would encourage you to get a flu shot!   If your blood pressure starts to go up again (over 140/90) please give me a call and we can add back HCTZ

## 2014-01-23 NOTE — Progress Notes (Signed)
[facility] [address], [place] [address] [phone]  Date:  01/23/2014   Name:  Samantha Pearson   DOB:  [birth year]   MRN:  [id number]  PCP:  Tally DueGUEST, CHRIS WARREN, MD    Chief Complaint: rx refills   History of Present Illness:  Samantha RanchJanice M Pearson is a 53 y.o. very pleasant female patient who presents with the following:  She had recently suffered through a severe buttock abscess.  However prior to this she was seen in early november for her BP. At that time she was changed to lisinopril 20 (from lisinopril hctz) and amlodipine 10.  She stopped taking the amlodipine about 2 weeks ago; it seemed to cause swelling of her legs and feet.  Stopping the medication got rid of her swelling.  She may have slight dizziness off and on.  Over the last couple of weeks her BP has run around 120- 130  BP Readings from Last 3 Encounters:  01/23/14 126/72  01/02/14 158/80  12/30/13 140/80     Patient Active Problem List   Diagnosis Date Noted  . Obesity, unspecified 08/18/2013  . Stress 05/22/2011  . ESSENTIAL HYPERTENSION, BENIGN 08/07/2008  . CHEST PAIN 08/07/2008    Past Medical History  Diagnosis Date  . Chest pain 08/03/08    Urgent care visit  . Hypertension   . Anxiety   . Edema   . Dyspnea   . Obesity   . Allergy   . Depression     Past Surgical History  Procedure Laterality Date  . Cesarean section      History  Substance Use Topics  . Smoking status: Never Smoker   . Smokeless tobacco: Not on file     Comment: Nonsmoker  . Alcohol Use: No    Family History  Problem Relation Age of Onset  . Heart failure Father     CHF    Allergies  Allergen Reactions  . Doxycycline Hives, Itching and Swelling  . Septra [Bactrim] Hives, Itching and Swelling  . Latex Swelling and Rash    Medication list has been reviewed and updated.  Current Outpatient Prescriptions on File Prior to Visit  Medication Sig Dispense Refill  . BLACK COHOSH EXTRACT  PO Take by mouth daily.    . Calcium Carbonate-Vitamin D (CALCIUM + D PO) Take by mouth daily.    Marland Kitchen. lisinopril (PRINIVIL,ZESTRIL) 20 MG tablet Take 1 tablet (20 mg total) by mouth daily. 30 tablet 5  . magnesium 30 MG tablet Take 30 mg by mouth 2 (two) times daily.    . Multiple Vitamin (MULTIVITAMIN) tablet Take 1 tablet by mouth daily.    Marland Kitchen. OVER THE COUNTER MEDICATION OTC Vitamin  B12 500 mg taking daily    . amLODipine (NORVASC) 10 MG tablet Take 0.5 tablets (5 mg total) by mouth daily. (Patient not taking: Reported on 01/23/2014) 30 tablet 5  . fluconazole (DIFLUCAN) 150 MG tablet Take 1 tablet (150 mg total) by mouth once. Repeat if needed (Patient not taking: Reported on 01/23/2014) 2 tablet 0   Current Facility-Administered Medications on File Prior to Visit  Medication Dose Route Frequency Provider Last Rate Last Dose  . ibuprofen (ADVIL,MOTRIN) tablet 400 mg  400 mg Oral Once Pearline CablesJessica C Shakelia Scrivner, MD        Review of Systems:  As per HPI- otherwise negative.   Physical Examination: Filed Vitals:   01/23/14 1153  BP: 126/72  Pulse: 79  Temp: 98  F (36.7 C)  Resp: 18   Filed Vitals:   01/23/14 1153  Height: 5' (1.524 m)  Weight: 225 lb (102.059 kg)   Body mass index is 43.94 kg/(m^2). Ideal Body Weight: Weight in (lb) to have BMI = 25: 127.7  GEN: WDWN, NAD, Non-toxic, A & O x 3, obese, looks well HEENT: Atraumatic, Normocephalic. Neck supple. No masses, No LAD. Ears and Nose: No external deformity. CV: RRR, No M/G/R. No JVD. No thrill. No extra heart sounds. PULM: CTA B, no wheezes, crackles, rhonchi. No retractions. No resp. distress. No accessory muscle use. EXTR: No c/c/e NEURO Normal gait.  PSYCH: Normally interactive. Conversant. Not depressed or anxious appearing.  Calm demeanor.  examined site of her right buttock abscess- it looks fine.  No evidence of recurrent abscess. The area is a little firm- this is normal   Assessment and Plan: Essential  hypertension  BP looks great on lisinopril 20 mg only.  Recent labs were ok.  She has plans to see me in May for a physical. Encouraged a flu shot but she declines    Chemistry      Component Value Date/Time   NA 140 12/08/2013 0850   K 4.0 12/08/2013 0850   CL 102 12/08/2013 0850   CO2 27 12/08/2013 0850   BUN 8 12/08/2013 0850   CREATININE 0.78 12/08/2013 0850      Component Value Date/Time   CALCIUM 9.7 12/08/2013 0850   ALKPHOS 80 05/13/2012 1014   AST 22 05/13/2012 1014   ALT 18 05/13/2012 1014   BILITOT 0.8 05/13/2012 1014       Signed Abbe AmsterdamJessica Aubrey Blackard, MD

## 2014-01-28 ENCOUNTER — Encounter: Payer: Self-pay | Admitting: Internal Medicine

## 2014-02-18 ENCOUNTER — Encounter: Payer: Self-pay | Admitting: Internal Medicine

## 2014-03-09 ENCOUNTER — Encounter: Payer: Self-pay | Admitting: Internal Medicine

## 2014-03-30 ENCOUNTER — Ambulatory Visit (AMBULATORY_SURGERY_CENTER): Payer: Self-pay | Admitting: *Deleted

## 2014-03-30 VITALS — Ht 60.0 in | Wt 217.0 lb

## 2014-03-30 DIAGNOSIS — Z1211 Encounter for screening for malignant neoplasm of colon: Secondary | ICD-10-CM

## 2014-03-30 MED ORDER — MOVIPREP 100 G PO SOLR: ORAL | Status: DC

## 2014-03-30 NOTE — Progress Notes (Signed)
No egg or soy allergy  No anesthesia or intubation problems per pt  No diet medications taken  Registered in EMMI   

## 2014-03-31 LAB — HM MAMMOGRAPHY

## 2014-04-07 ENCOUNTER — Telehealth: Payer: Self-pay | Admitting: Internal Medicine

## 2014-04-07 NOTE — Telephone Encounter (Signed)
Pt states that she cannot afford the 100.00 prep.  Changed to Miralax and pt will be to get instructions tomorrow

## 2014-04-13 ENCOUNTER — Encounter: Payer: Self-pay | Admitting: Internal Medicine

## 2014-04-13 ENCOUNTER — Ambulatory Visit (AMBULATORY_SURGERY_CENTER): Payer: BLUE CROSS/BLUE SHIELD | Admitting: Internal Medicine

## 2014-04-13 VITALS — BP 183/91 | HR 63 | Temp 96.7°F | Resp 23 | Ht 60.0 in | Wt 217.0 lb

## 2014-04-13 DIAGNOSIS — Z1211 Encounter for screening for malignant neoplasm of colon: Secondary | ICD-10-CM

## 2014-04-13 MED ORDER — SODIUM CHLORIDE 0.9 % IV SOLN: 500.0000 mL | INTRAVENOUS | Status: DC

## 2014-04-13 NOTE — Progress Notes (Signed)
A/ox3, pleased with MAC, report to RN 

## 2014-04-13 NOTE — Op Note (Signed)
[facility] [address] [place], [id number]   COLONOSCOPY PROCEDURE REPORT  PATIENT: Samantha Pearson, Samantha Pearson  MR#: [id number] BIRTHDATE: 1960/06/21 , 53  yrs. old GENDER: female ENDOSCOPIST: Roxy CedarJohn N Edythe Riches Jr, MD REFERRED ON:GEXBMBY:Chris Guest, Pearson.D. PROCEDURE DATE:  04/13/2014 PROCEDURE:   Colonoscopy, screening First Screening Colonoscopy - Avg.  risk and is 50 yrs.  old or older Yes.  Prior Negative Screening - Now for repeat screening. N/A  History of Adenoma - Now for follow-up colonoscopy & has been > or = to 3 yrs.  N/A  Polyps Removed Today? No.  Recommend repeat exam, <10 yrs? No. ASA CLASS:   Class II INDICATIONS:Screening for colonic neoplasia and Average Risk. MEDICATIONS: Monitored anesthesia care and Propofol 200 mg IV  DESCRIPTION OF PROCEDURE:   After the risks benefits and alternatives of the procedure were thoroughly explained, informed consent was obtained.  The digital rectal exam revealed no abnormalities of the rectum.   The LB [id number]  endoscope was introduced through the anus and advanced to the cecum, which was identified by both the appendix and ileocecal valve. No adverse events experienced.   The quality of the prep was excellent.  , using MiraLax  The instrument was then slowly withdrawn as the colon was fully examined.      COLON FINDINGS: There was mild diverticulosis noted in the left colon.   The examination was otherwise normal.  Retroflexed views revealed no abnormalities. The time to cecum = 1.9 Withdrawal time = 9.3   The scope was withdrawn and the procedure completed.  COMPLICATIONS: There were no immediate complications.  ENDOSCOPIC IMPRESSION: 1.   Mild diverticulosis was noted in the left colon 2.   The examination was otherwise normal  RECOMMENDATIONS: 1. Continue current colorectal screening recommendations for "routine risk" patients with a repeat colonoscopy in 10 years.  eSigned:  Roxy CedarJohn N Katie Faraone Jr, MD 04/13/2014  12:07 PM   cc: Robert Bellowhris Guest, MD and The Patient

## 2014-04-13 NOTE — Patient Instructions (Signed)
YOU HAD AN ENDOSCOPIC PROCEDURE TODAY AT THE [facility]:   Refer to the procedure report that was given to you for any specific questions about what was found during the examination.  If the procedure report does not answer your questions, please call your gastroenterologist to clarify.  If you requested that your care partner not be given the details of your procedure findings, then the procedure report has been included in a sealed envelope for you to review at your convenience later.  YOU SHOULD EXPECT: Some feelings of bloating in the abdomen. Passage of more gas than usual.  Walking can help get rid of the air that was put into your GI tract during the procedure and reduce the bloating. If you had a lower endoscopy (such as a colonoscopy or flexible sigmoidoscopy) you may notice spotting of blood in your stool or on the toilet paper. If you underwent a bowel prep for your procedure, you may not have a normal bowel movement for a few days.  Please Note:  You might notice some irritation and congestion in your nose or some drainage.  This is from the oxygen used during your procedure.  There is no need for concern and it should clear up in a day or so.  SYMPTOMS TO REPORT IMMEDIATELY:   Following lower endoscopy (colonoscopy or flexible sigmoidoscopy):  Excessive amounts of blood in the stool  Significant tenderness or worsening of abdominal pains  Swelling of the abdomen that is new, acute  Fever of 100F or higher    For urgent or emergent issues, a gastroenterologist can be reached at any hour by calling [phone].   DIET: Your first meal following the procedure should be a small meal and then it is ok to progress to your normal diet. Heavy or fried foods are harder to digest and may make you feel nauseous or bloated.  Likewise, meals heavy in dairy and vegetables can increase bloating.  Drink plenty of fluids but you should avoid alcoholic beverages for 24  hours.  ACTIVITY:  You should plan to take it easy for the rest of today and you should NOT DRIVE or use heavy machinery until tomorrow (because of the sedation medicines used during the test).    FOLLOW UP: Our staff will call the number listed on your records the next business day following your procedure to check on you and address any questions or concerns that you may have regarding the information given to you following your procedure. If we do not reach you, we will leave a message.  However, if you are feeling well and you are not experiencing any problems, there is no need to return our call.  We will assume that you have returned to your regular daily activities without incident.  If any biopsies were taken you will be contacted by phone or by letter within the next 1-3 weeks.  Please call us at [phone] if you have not heard about the biopsies in 3 weeks.    SIGNATURES/CONFIDENTIALITY: You and/or your care partner have signed paperwork which will be entered into your electronic medical record.  These signatures attest to the fact that that the information above on your After Visit Summary has been reviewed and is understood.  Full responsibility of the confidentiality of this discharge information lies with you and/or your care-partner.   Information on diverticulosis given to you today 

## 2014-04-14 ENCOUNTER — Telehealth: Payer: Self-pay | Admitting: *Deleted

## 2014-04-14 NOTE — Telephone Encounter (Signed)
  Follow up Call-  Call back number 04/13/2014  Post procedure Call Back phone  # [phone]  Permission to leave phone message Yes    [facility]

## 2014-04-20 ENCOUNTER — Ambulatory Visit (INDEPENDENT_AMBULATORY_CARE_PROVIDER_SITE_OTHER): Payer: BLUE CROSS/BLUE SHIELD | Admitting: Physician Assistant

## 2014-04-20 VITALS — BP 180/82 | HR 63 | Temp 97.9°F | Resp 17 | Ht 59.0 in | Wt 216.8 lb

## 2014-04-20 DIAGNOSIS — J069 Acute upper respiratory infection, unspecified: Secondary | ICD-10-CM | POA: Diagnosis not present

## 2014-04-20 DIAGNOSIS — J4521 Mild intermittent asthma with (acute) exacerbation: Secondary | ICD-10-CM

## 2014-04-20 DIAGNOSIS — R062 Wheezing: Secondary | ICD-10-CM | POA: Diagnosis not present

## 2014-04-20 DIAGNOSIS — I10 Essential (primary) hypertension: Secondary | ICD-10-CM | POA: Diagnosis not present

## 2014-04-20 DIAGNOSIS — B9789 Other viral agents as the cause of diseases classified elsewhere: Secondary | ICD-10-CM

## 2014-04-20 MED ORDER — LISINOPRIL-HYDROCHLOROTHIAZIDE 20-12.5 MG PO TABS: 1.0000 | ORAL_TABLET | Freq: Every day | ORAL | Status: DC

## 2014-04-20 MED ORDER — ACETAMINOPHEN 500 MG PO TABS: 1000.0000 mg | ORAL_TABLET | Freq: Three times a day (TID) | ORAL | Status: DC | PRN

## 2014-04-20 MED ORDER — ALBUTEROL SULFATE (2.5 MG/3ML) 0.083% IN NEBU
2.5000 mg | INHALATION_SOLUTION | Freq: Once | RESPIRATORY_TRACT | Status: AC
  Administered 2014-04-20: 2.5 mg via RESPIRATORY_TRACT

## 2014-04-20 MED ORDER — ALBUTEROL SULFATE HFA 108 (90 BASE) MCG/ACT IN AERS: 2.0000 | INHALATION_SPRAY | Freq: Four times a day (QID) | RESPIRATORY_TRACT | Status: DC | PRN

## 2014-04-20 MED ORDER — HYDROCOD POLST-CHLORPHEN POLST 10-8 MG/5ML PO LQCR: 5.0000 mL | Freq: Two times a day (BID) | ORAL | Status: DC | PRN

## 2014-04-20 MED ORDER — IPRATROPIUM BROMIDE 0.02 % IN SOLN
0.5000 mg | Freq: Once | RESPIRATORY_TRACT | Status: AC
  Administered 2014-04-20: 0.5 mg via RESPIRATORY_TRACT

## 2014-04-20 MED ORDER — BENZONATATE 100 MG PO CAPS: 100.0000 mg | ORAL_CAPSULE | Freq: Three times a day (TID) | ORAL | Status: DC | PRN

## 2014-04-20 NOTE — Progress Notes (Signed)
04/20/2014 at 12:17 PM  Samantha Pearson / DOB: 12-18-1960 / MRN: [id number]  The patient has ESSENTIAL HYPERTENSION, BENIGN; CHEST PAIN; Stress; and Obesity, unspecified on her problem list.  SUBJECTIVE  Chief compalaint: Sinusitis; Chest congestion; and Wheezing   History of present illness: Samantha Pearson is 54 y.o. well appearing female presenting for chest congestion which she states is moderate. This started 4 days ago and is improving.   Associated symptoms include nasal congestion and mild HA. She denies diaphoresis, myalgia, and fever.  She denies a history of asthma. She has tried Cordicindn HBP flu and tussin, along with clindamycin since satrudary with good to fair relief. She has had similar symptoms in the past and reports that she has need an albuterol inhaler due to wheezing.   She reports a history of HTN and has been compliant with 20 mg of Lisinopril daily.  She reports her BP has been running around 180 over 90, but she is unsure how long it has been running this high.  She was previously on Lisinopril/HCTZ 10/25 and tolerated this without complaint. She has a blood pressure cuff at home and is willing to keep a diary.     She  has a past medical history of Chest pain (08/03/08); Hypertension; Anxiety; Edema; Dyspnea; Obesity; Allergy; Depression; Arthritis; and Heart murmur.    She  has a current medication list which includes the following prescription(s): black cohosh, calcium carbonate-vitamin d, glucosamine-chondroitin, lisinopril, magnesium, multivitamin, omega-3 fatty acids, OVER THE COUNTER MEDICATION, and ibuprofen, and the following Facility-Administered Medications: ibuprofen.  Samantha Pearson is allergic to doxycycline; septra; and latex. She  reports that she has never smoked. She has never used smokeless tobacco. She reports that she does not drink alcohol or use illicit drugs. She  reports that she currently engages in sexual activity. She reports using the following method of  birth control/protection: None. The patient  has past surgical history that includes Cesarean section and Dilation and curettage of uterus.  Her family history includes Colon cancer in her maternal uncle; Colon polyps in her mother; Heart failure in her father. There is no history of Esophageal cancer, Stomach cancer, or Rectal cancer.  Review of Systems  Constitutional: Negative for fever and chills.  HENT: Positive for congestion and sore throat.   Respiratory: Positive for cough, sputum production and wheezing. Negative for hemoptysis and shortness of breath.   Cardiovascular: Negative for chest pain, palpitations and leg swelling.  Gastrointestinal: Negative for nausea and vomiting.  Musculoskeletal: Negative for myalgias.  Neurological: Positive for headaches (mild). Negative for dizziness, sensory change and focal weakness.    OBJECTIVE  Her  height is  (1.499 m) and weight is 216 lb 12.8 oz (98.34 kg). Her oral temperature is 97.9 F (36.6 C). Her blood pressure is 180/82 and her pulse is 63. Her respiration is 17 and oxygen saturation is 99%.  The patient's body mass index is 43.77 kg/(m^2).  Physical Exam  Constitutional: She is oriented to person, place, and time. [name]. No distress.  Eyes: Conjunctivae and EOM are normal. Pupils are equal, round, and reactive to light.  Cardiovascular: Normal rate, regular rhythm, normal heart sounds and intact distal pulses.   Respiratory: Effort normal. No respiratory distress. She has wheezes. She has no rales. She exhibits no tenderness.  GI: Soft. Bowel sounds are normal.  Lymphadenopathy:    She has no cervical adenopathy.  Neurological: She is alert and oriented to person, place,  and time. She has normal reflexes.  Skin: Skin is warm and dry. She is not diaphoretic.  Psychiatric: She has a normal mood and affect. [name]. Judgment and thought content normal.    No results  found for this or any previous visit (from the past 24 hour(s)).  ASSESSMENT & PLAN  Samantha Pearson was seen today for sinusitis, chest congestion and wheezing.  Diagnoses and all orders for this visit:  Wheezing:  Improved but not resolved with duoneb.  Did not want to re-dose given uncontrolled HTN. Orders: -     albuterol (PROVENTIL) (2.5 MG/3ML) 0.083% nebulizer solution 2.5 mg; Take 3 mLs (2.5 mg total) by nebulization once. -     ipratropium (ATROVENT) nebulizer solution 0.5 mg; Take 2.5 mLs (0.5 mg total) by nebulization once.  Viral URI with cough Orders: -     benzonatate (TESSALON) 100 MG capsule; Take 1-2 capsules (100-200 mg total) by mouth 3 (three) times daily as needed for cough. -     acetaminophen (TYLENOL) 500 MG tablet; Take 2 tablets (1,000 mg total) by mouth every 8 (eight) hours as needed. -     chlorpheniramine-HYDROcodone (TUSSIONEX PENNKINETIC ER) 10-8 MG/5ML LQCR; Take 5 mLs by mouth every 12 (twelve) hours as needed for cough (cough).  Essential hypertension: Adding HCTZ at its lowest dose while maintaining previous Lisinopril dosage. Will evaluate BP in roughly one week, as patient was given a log to keep in which she will check her BP randomly on a daily basis.    Orders: -     lisinopril-hydrochlorothiazide (PRINZIDE,ZESTORETIC) 20-12.5 MG per tablet; Take 1 tablet by mouth daily.  Reactive airway disease, mild intermittent, with acute exacerbation: Good improvement in airflow with douneb.  Patient is a poor candidate for PO steroids at this time 2/2 uncontrolled HTN.  She will start her new medication today and will be logging her BP for the next week, at which time she will return the log to me for evaluation.   Orders: -     albuterol (PROVENTIL HFA;VENTOLIN HFA) 108 (90 BASE) MCG/ACT inhaler; Inhale 2 puffs into the lungs every 6 (six) hours as needed for wheezing or shortness of breath.   The patient was advised to call or come back to clinic if she does not see  an improvement in symptoms, or worsens with the above plan.   Deliah BostonMichael Clark, MHS, PA-C [facility] 04/20/2014 12:17 PM

## 2014-06-08 ENCOUNTER — Ambulatory Visit (INDEPENDENT_AMBULATORY_CARE_PROVIDER_SITE_OTHER): Payer: BLUE CROSS/BLUE SHIELD | Admitting: Family Medicine

## 2014-06-08 ENCOUNTER — Encounter: Payer: Self-pay | Admitting: Family Medicine

## 2014-06-08 VITALS — BP 160/88 | HR 80 | Temp 98.9°F | Resp 16 | Ht 59.5 in | Wt 217.0 lb

## 2014-06-08 DIAGNOSIS — I10 Essential (primary) hypertension: Secondary | ICD-10-CM

## 2014-06-08 DIAGNOSIS — Z130 Encounter for screening for diseases of the blood and blood-forming organs and certain disorders involving the immune mechanism: Secondary | ICD-10-CM

## 2014-06-08 DIAGNOSIS — Z131 Encounter for screening for diabetes mellitus: Secondary | ICD-10-CM

## 2014-06-08 DIAGNOSIS — E669 Obesity, unspecified: Secondary | ICD-10-CM | POA: Diagnosis not present

## 2014-06-08 DIAGNOSIS — Z5181 Encounter for therapeutic drug level monitoring: Secondary | ICD-10-CM | POA: Diagnosis not present

## 2014-06-08 DIAGNOSIS — R5383 Other fatigue: Secondary | ICD-10-CM

## 2014-06-08 LAB — COMPREHENSIVE METABOLIC PANEL
ALBUMIN: 4.1 g/dL (ref 3.5–5.2)
ALT: 21 U/L (ref 0–35)
AST: 21 U/L (ref 0–37)
Alkaline Phosphatase: 101 U/L (ref 39–117)
BUN: 13 mg/dL (ref 6–23)
CALCIUM: 10 mg/dL (ref 8.4–10.5)
CHLORIDE: 100 meq/L (ref 96–112)
CO2: 26 mEq/L (ref 19–32)
Creat: 0.84 mg/dL (ref 0.50–1.10)
Glucose, Bld: 84 mg/dL (ref 70–99)
Potassium: 3.8 mEq/L (ref 3.5–5.3)
Sodium: 139 mEq/L (ref 135–145)
Total Bilirubin: 0.9 mg/dL (ref 0.2–1.2)
Total Protein: 7.8 g/dL (ref 6.0–8.3)

## 2014-06-08 LAB — LIPID PANEL
CHOLESTEROL: 225 mg/dL — AB (ref 0–200)
HDL: 62 mg/dL (ref >=46)
LDL Cholesterol: 136 mg/dL — ABNORMAL HIGH (ref 0–99)
TRIGLYCERIDES: 137 mg/dL (ref <150)
Total CHOL/HDL Ratio: 3.6 Ratio
VLDL: 27 mg/dL (ref 0–40)

## 2014-06-08 LAB — CBC
HCT: 40 % (ref 36.0–46.0)
HEMOGLOBIN: 12.5 g/dL (ref 12.0–15.0)
MCH: 26.5 pg (ref 26.0–34.0)
MCHC: 31.3 g/dL (ref 30.0–36.0)
MCV: 84.7 fL (ref 78.0–100.0)
MPV: 10.6 fL (ref 8.6–12.4)
Platelets: 325 10*3/uL (ref 150–400)
RBC: 4.72 MIL/uL (ref 3.87–5.11)
RDW: 14.5 % (ref 11.5–15.5)
WBC: 6.8 10*3/uL (ref 4.0–10.5)

## 2014-06-08 LAB — HEMOGLOBIN A1C
Hgb A1c MFr Bld: 5.8 % — ABNORMAL HIGH (ref <5.7)
MEAN PLASMA GLUCOSE: 120 mg/dL — AB (ref <117)

## 2014-06-08 MED ORDER — LISINOPRIL 20 MG PO TABS: 20.0000 mg | ORAL_TABLET | Freq: Every day | ORAL | Status: DC

## 2014-06-08 NOTE — Patient Instructions (Signed)
I am going to refer you for a sleep study- please call your insurance company and find out how much this may cost for you If you are able, I think a sleep study may give us some helpful information It sounds like you are doing a good job trying to take care of yourself.  Continue to try and work exercise into your routine, and plan you meals in advance   I will be in touch with your labs asap  Please add the lisinopril 20mg  before you go to bed and the lisinopril/ hctz when you get up at for work  Please keep me posted as to your blood pressure.  You might try a wrist cuff that is easier to use Please get your tetanus shot at your next visit.

## 2014-06-08 NOTE — Progress Notes (Signed)
[facility] [address] [place] [address] [phone]  Date:  06/08/2014   Name:  Samantha Pearson   DOB:  04/06/1960   MRN:  [id number]  PCP:  Tally Due, MD    Chief Complaint: follow up bp   History of Present Illness:  Samantha Pearson is a 54 y.o. very pleasant female patient who presents with the following:  Here today to follow-up her HTN.   Last seen by myself in December.  At that time she was doing well with lisinopril .  However over the last couple of months her BP has come back up again.  HCTZ was added back in March of this year.    She is fasting today for labs. Her BP is a bit better since we added back the HCTZ.  At home she has been running "up and down, 170-180/ 79-80s"    She is still working 3rd shift which is stressful for her, as well as part time jobs.  She would like to work on a diet plan. She has worked on changing her diet, but working 3rd shift makes it hard for her to follow a good diet and exercise plan.    M-F she works 11p- 7a On Saturday she works 11p- 3a, she also works 2 part time jobs She generally gets 6-7 hours of sleep a night except for Wednesday when she has to work more.   She does think that she is snoring, she does feel sleepy a good bit.   Sleeping during the day can be hard because of yard work sounds going on around her.    Wt Readings from Last 3 Encounters:  06/08/14 217 lb (98.431 kg)  04/20/14 216 lb 12.8 oz (98.34 kg)  04/13/14 217 lb (98.431 kg)     BP Readings from Last 3 Encounters:  06/08/14 161/89  04/20/14 180/82  04/13/14 183/91    Patient Active Problem List   Diagnosis Date Noted  . Obesity, unspecified 08/18/2013  . Stress 05/22/2011  . ESSENTIAL HYPERTENSION, BENIGN 08/07/2008  . CHEST PAIN 08/07/2008    Past Medical History  Diagnosis Date  . Chest pain 08/03/08    Urgent care visit  . Hypertension   . Anxiety   . Edema   . Dyspnea   . Obesity   . Allergy   .  Depression   . Arthritis   . Heart murmur     Past Surgical History  Procedure Laterality Date  . Cesarean section    . Dilation and curettage of uterus      laparoscopic    History  Substance Use Topics  . Smoking status: Never Smoker   . Smokeless tobacco: Never Used     Comment: Nonsmoker  . Alcohol Use: No    Family History  Problem Relation Age of Onset  . Heart failure Father     CHF  . Colon cancer Maternal Uncle   . Esophageal cancer Neg Hx   . Stomach cancer Neg Hx   . Rectal cancer Neg Hx   . Colon polyps Mother     Allergies  Allergen Reactions  . Doxycycline Hives, Itching and Swelling  . Septra [Bactrim] Hives, Itching and Swelling  . Latex Swelling and Rash    Medication list has been reviewed and updated.  Current Outpatient Prescriptions on File Prior to Visit  Medication Sig Dispense Refill  . acetaminophen (TYLENOL) 500 MG tablet Take 2 tablets (  1,000 mg total) by mouth every 8 (eight) hours as needed. 30 tablet 0  . BLACK COHOSH EXTRACT PO Take by mouth daily.    . Calcium Carbonate-Vitamin D (CALCIUM + D PO) Take by mouth daily.    . Glucosamine-Chondroitin (GLUCOSAMINE CHONDR COMPLEX PO) Take by mouth daily.    . IBUPROFEN PO Take by mouth as needed.    Marland Kitchen. lisinopril-hydrochlorothiazide (PRINZIDE,ZESTORETIC) 20-12.5 MG per tablet Take 1 tablet by mouth daily. 30 tablet 3  . magnesium 30 MG tablet Take 30 mg by mouth 2 (two) times daily.    . Multiple Vitamin (MULTIVITAMIN) tablet Take 1 tablet by mouth daily.    . Omega-3 Fatty Acids (OMEGA 3 PO) Take by mouth daily.    Marland Kitchen. OVER THE COUNTER MEDICATION OTC Vitamin  B12 500 mg taking daily    . albuterol (PROVENTIL HFA;VENTOLIN HFA) 108 (90 BASE) MCG/ACT inhaler Inhale 2 puffs into the lungs every 6 (six) hours as needed for wheezing or shortness of breath. (Patient not taking: Reported on 06/08/2014) 1 Inhaler 2   Current Facility-Administered Medications on File Prior to Visit  Medication Dose  Route Frequency Provider Last Rate Last Dose  . ibuprofen (ADVIL,MOTRIN) tablet 400 mg  400 mg Oral Once Pearline CablesJessica C Davin Archuletta, MD        Review of Systems:  As per HPI- otherwise negative.   Physical Examination: Filed Vitals:   06/08/14 0816  BP: 161/89  Pulse: 80  Temp: 98.9 F (37.2 C)  Resp: 16   Filed Vitals:   06/08/14 0816  Height: 4' 11.5" (1.511 m)  Weight: 217 lb (98.431 kg)   Body mass index is 43.11 kg/(m^2). Ideal Body Weight: Weight in (lb) to have BMI = 25: 125.6  GEN: WDWN, NAD, Non-toxic, A & O x 3, obese, looks well HEENT: Atraumatic, Normocephalic. Neck supple. No masses, No LAD. Ears and Nose: No external deformity. CV: RRR, No M/G/R. No JVD. No thrill. No extra heart sounds. PULM: CTA B, no wheezes, crackles, rhonchi. No retractions. No resp. distress. No accessory muscle use. EXTR: No c/c/e NEURO Normal gait.  PSYCH: Normally interactive. Conversant. Not depressed or anxious appearing.  Calm demeanor.   Assessment and Plan: Essential hypertension - Plan: Comprehensive metabolic panel, lisinopril (PRINIVIL,ZESTRIL) 20 MG tablet  Obesity - Plan: Lipid panel, Hemoglobin A1c  Screening for diabetes mellitus - Plan: Comprehensive metabolic panel, Hemoglobin A1c  Medication monitoring encounter - Plan: Comprehensive metabolic panel  Screening for deficiency anemia - Plan: CBC  Other fatigue - Plan: Ambulatory referral to Sleep Studies  Discussed diet and exercise plan for her; encouraged her to cut down on her working hours if possible/  Will set her up for a sleep study (she had declined this in the past due to cost) See patient instructions for more details.   Increase lisinopril for uncontrolled HTN She delines a tetanus shot today  Signed Abbe AmsterdamJessica Ryott Rafferty, MD

## 2014-06-09 ENCOUNTER — Encounter: Payer: Self-pay | Admitting: Family Medicine

## 2014-07-09 ENCOUNTER — Encounter: Payer: Self-pay | Admitting: *Deleted

## 2014-07-30 ENCOUNTER — Telehealth: Payer: Self-pay

## 2014-07-30 DIAGNOSIS — I10 Essential (primary) hypertension: Secondary | ICD-10-CM

## 2014-07-30 NOTE — Telephone Encounter (Signed)
Patient has 1 refill of lisinopril with remaining and the pharmacy would like to know if she could get 3 packaged and 1 refill. Patient phone: [phone]

## 2014-07-31 MED ORDER — LISINOPRIL-HYDROCHLOROTHIAZIDE 20-12.5 MG PO TABS: 1.0000 | ORAL_TABLET | Freq: Every day | ORAL | Status: DC

## 2014-07-31 NOTE — Telephone Encounter (Signed)
Left message for pt to call back  °

## 2014-07-31 NOTE — Telephone Encounter (Signed)
Send Rx for 90 days.

## 2014-08-26 ENCOUNTER — Telehealth: Payer: Self-pay | Admitting: *Deleted

## 2014-08-26 NOTE — Telephone Encounter (Signed)
[facility]  Will update once received.

## 2014-08-27 NOTE — Telephone Encounter (Signed)
Received faxed mammo report dated 04/01/2014 - no mammographic evidence of malignancy.  Updated health maintenance, abstracted, sent to med rec & to PCP for review then to be sent to be scanned into EMR.

## 2014-09-07 ENCOUNTER — Ambulatory Visit: Payer: BLUE CROSS/BLUE SHIELD

## 2014-09-07 ENCOUNTER — Ambulatory Visit (INDEPENDENT_AMBULATORY_CARE_PROVIDER_SITE_OTHER): Payer: BLUE CROSS/BLUE SHIELD

## 2014-09-07 ENCOUNTER — Ambulatory Visit (INDEPENDENT_AMBULATORY_CARE_PROVIDER_SITE_OTHER): Payer: BLUE CROSS/BLUE SHIELD | Admitting: Family Medicine

## 2014-09-07 VITALS — BP 178/94 | HR 67 | Temp 98.4°F | Resp 16 | Ht 59.5 in | Wt 221.0 lb

## 2014-09-07 DIAGNOSIS — I10 Essential (primary) hypertension: Secondary | ICD-10-CM | POA: Diagnosis not present

## 2014-09-07 DIAGNOSIS — M79644 Pain in right finger(s): Secondary | ICD-10-CM

## 2014-09-07 DIAGNOSIS — R252 Cramp and spasm: Secondary | ICD-10-CM

## 2014-09-07 DIAGNOSIS — Z72820 Sleep deprivation: Secondary | ICD-10-CM | POA: Diagnosis not present

## 2014-09-07 DIAGNOSIS — I1A0 Resistant hypertension: Secondary | ICD-10-CM

## 2014-09-07 DIAGNOSIS — Z7689 Persons encountering health services in other specified circumstances: Secondary | ICD-10-CM

## 2014-09-07 LAB — BASIC METABOLIC PANEL WITH GFR
Creat: 0.76 mg/dL (ref 0.50–1.05)
Potassium: 3.8 mmol/L (ref 3.5–5.3)

## 2014-09-07 LAB — BASIC METABOLIC PANEL
BUN: 14 mg/dL (ref 7–25)
CO2: 28 mmol/L (ref 20–31)
Calcium: 9.8 mg/dL (ref 8.6–10.4)
Chloride: 102 mmol/L (ref 98–110)
Glucose, Bld: 96 mg/dL (ref 65–99)
Sodium: 141 mmol/L (ref 135–146)

## 2014-09-07 MED ORDER — TRAMADOL HCL 50 MG PO TABS: 50.0000 mg | ORAL_TABLET | Freq: Three times a day (TID) | ORAL | Status: DC | PRN

## 2014-09-07 NOTE — Progress Notes (Signed)
Chief Complaint:  Chief Complaint  Patient presents with  . Itching and swollen    4th finger on right hand/ onset 1 week and 4 days    HPI: Samantha Pearson is a 54 y.o. female who reports to [facility] today complaining of right fourth finger pain and swelling and itching for the last 11 days . She does not know what she did with it. 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 She started having itchiness and swelling after that. She also banged her finger on the desk that same night . So she is not sure what might of triggered this. It continues to be swollen and painful and she has difficulty with range of motion with it. She denies any fevers or chills. It has not radiated up her hand or upper arm. She has some numbness and tingling. No fevers or chills. No prior injuries to that finger. She is left-hand dominant. 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 She has been using her hands  Blood pressure has been high. She is currently taking lisinopril 20 mg dailin the AM whne comes off work on third shift. She takes Lisinopril/HCTZ 20/12.5 mg at night when she goes to work.  She has been on amlodipine 5 mg and also 10 mg before but had Danyela Posas edema so this was stopped She had been referred to sleep study but was not able to make it She has previosuly told Dr Patsy Lager she has OSA sxs: fatigue, snoring  She has anxiety and insomnia and anxiety issues, xanax seems to helps some with this Lots of work stressors  Nonsmoker, no etoh  BP Readings from Last 3 Encounters:  09/07/14 178/94  06/08/14 160/88  04/20/14 180/82     Past Medical History  Diagnosis Date  . Chest pain 08/03/08    Urgent care visit  . Hypertension   . Anxiety   . Edema   . Dyspnea   . Obesity   . Allergy   . Depression   . Arthritis   . Heart  murmur    Past Surgical History  Procedure Laterality Date  . Cesarean section    . Dilation and curettage of uterus      laparoscopic   History   Social History  . Marital Status: Single    Spouse Name: N/A  . Number of Children: 1  . Years of Education: N/A   Occupational History  . 3 jobs     International aid/development worker   Social History Main Topics  . Smoking status: Never Smoker   . Smokeless tobacco: Never Used     Comment: Nonsmoker  . Alcohol Use: No  . Drug Use: No  . Sexual Activity: Yes    Birth Control/ Protection: None   Other Topics Concern  . None   Social History Narrative   Lives with daughter and grandchild.   Filed for bankruptcy in 2010.   Family History  Problem Relation Age of Onset  . Heart failure Father     CHF  . Colon cancer Maternal Uncle   . Esophageal cancer Neg Hx   . Stomach cancer Neg Hx   . Rectal cancer Neg Hx   . Colon polyps Mother    Allergies  Allergen Reactions  . Doxycycline Hives, Itching and Swelling  . Septra [Bactrim] Hives, Itching and Swelling  . Latex Swelling and Rash   Prior to Admission medications   Medication Sig Start Date End Date Taking? Authorizing Provider  acetaminophen (TYLENOL) 500 MG tablet Take 2 tablets (1,000 mg total) by mouth every 8 (eight) hours as needed. 04/20/14  Yes Ofilia Neas, PA-C  BLACK COHOSH EXTRACT PO Take by mouth daily.   Yes Historical Provider, MD  Calcium Carbonate-Vitamin D (CALCIUM + D PO) Take by mouth daily.   Yes Historical Provider, MD  Glucosamine-Chondroitin (GLUCOSAMINE CHONDR COMPLEX PO) Take by mouth daily.   Yes Historical Provider, MD  lisinopril (PRINIVIL,ZESTRIL) 20 MG tablet Take 1 tablet (20 mg total) by mouth daily. 06/08/14  Yes Gwenlyn Found Copland, MD  lisinopril-hydrochlorothiazide (PRINZIDE,ZESTORETIC) 20-12.5 MG per tablet Take 1 tablet by mouth daily. 07/31/14  Yes Gwenlyn Found Copland, MD  magnesium 30 MG tablet Take 30 mg by mouth 2 (two) times daily.   Yes  Historical Provider, MD  Multiple Vitamin (MULTIVITAMIN) tablet Take 1 tablet by mouth daily.   Yes Historical Provider, MD  Omega-3 Fatty Acids (OMEGA 3 PO) Take by mouth daily.   Yes Historical Provider, MD  OVER THE COUNTER MEDICATION OTC Vitamin  B12 500 mg taking daily   Yes Historical Provider, MD     ROS: The patient denies fevers, chills, night sweats, unintentional weight loss, chest pain, palpitations, wheezing, dyspnea on exertion, nausea, vomiting, abdominal pain, dysuria, hematuria, melena, numbness, weakness, or tingling.   All other systems have been reviewed and were otherwise negative with the exception of those mentioned in the HPI and as above.    PHYSICAL EXAM: Filed Vitals:   09/07/14 0903  BP: 178/94  Pulse: 67  Temp: 98.4 F (36.9 C)  Resp: 16   Body mass index is 43.91 kg/(m^2).   General: Alert, no acute distress, obese AA female HEENT:  Normocephalic, atraumatic, oropharynx patent. EOMI, PERRLA Cardiovascular:  Regular rate and rhythm, no rubs murmurs or gallops.  No Carotid bruits, radial pulse intact.  Respiratory: Clear to auscultation bilaterally.  No wheezes, rales, or rhonchi.  No cyanosis, no use of accessory musculature Abdominal: No organomegaly, abdomen is soft and non-tender, positive bowel sounds. No masses. Skin: + swelling, bruising, minimally in right ring finger along distal phalanx, full ROM of finger, sensation intact, 5/5 sterngth, she has pain when she is trying to bend her finger, minimal redness, nuerovascularly in tact Neurologic: Facial musculature symmetric. Psychiatric: Patient acts appropriately throughout our interaction. Lymphatic: No cervical or submandibular lymphadenopathy Musculoskeletal: Gait intact.    LABS: Results for orders placed or performed in visit on 09/07/14  Basic metabolic panel  Result Value Ref Range   Sodium 141 135 - 146 mmol/L   Potassium 3.8 3.5 - 5.3 mmol/L   Chloride 102 98 - 110 mmol/L   CO2 28  20 - 31 mmol/L   Glucose, Bld 96 65 - 99 mg/dL   BUN 14 7 - 25 mg/dL   Creat 1.61 0.96 - 0.45 mg/dL   Calcium 9.8 8.6 - 40.9 mg/dL     EKG/XRAY:   Primary read interpreted by Dr. Conley Rolls at [facility]. + arthritic changes Right 4th finger distal middle phalanx fracture vs less likely gouty tophus   ASSESSMENT/PLAN: Encounter Diagnoses  Name Primary?  . Finger pain, right Yes  . Essential hypertension   . Sleep concern   . Cramps of left lower extremity   .  Resistant hypertension    Ms Hallmon is a pleasant obese 54 y/o AA female who is here for a right index finger contusion, arthritic flair vs  acute fracture. I went ahead and immobilized her since she has bruising, swelling and minimal erythema of the right index finger. IF the xrays come back negative for fractue then will go ahead and tell her to continue with immobilization and avoid use and see how she does in 1 week and then stop using aluminum finger splint.  She has resistant HTN, if BMP iss table then will ask her to take Lisinopril HCTZ 20/12.5 mg BID, she can stop the plain lisinopril. Fu in 1 month  Refer to sleep study  Since I think this will help with BP and also her sleep issues and fatigue.  I have advised her to monitor for worsening sxs, I think she does not need to be on abx at this time. IF it worsens she can call me and I can put her on abx but this has been present for 11 days Fu prn otherwise in 1 month for BP recheck  Gross sideeffects, risk and benefits, and alternatives of medications d/w patient. Patient is aware that all medications have potential sideeffects and we are unable to predict every sideeffect or drug-drug interaction that may occur.  Praneel Haisley DO  09/08/2014 2:41 PM  09/08/14-LM on machine about xrays and next steps with aluminum splint She is to start on Lisinopril /HCTZ 20/12.5 mg BID and stop the plain lisinopril. Get the sleep study.  Fu in 1 month

## 2014-09-10 ENCOUNTER — Telehealth: Payer: Self-pay | Admitting: Family Medicine

## 2014-09-10 NOTE — Telephone Encounter (Signed)
lmom for patient to give our appt center a call back please schedule patient for a OV for HTN with Copland on either 10/14/14 at 9:15 or 10/21/14 at 8:00

## 2014-10-09 ENCOUNTER — Encounter: Payer: Self-pay | Admitting: Family Medicine

## 2014-10-14 ENCOUNTER — Ambulatory Visit (INDEPENDENT_AMBULATORY_CARE_PROVIDER_SITE_OTHER): Payer: BLUE CROSS/BLUE SHIELD | Admitting: Family Medicine

## 2014-10-14 ENCOUNTER — Encounter: Payer: Self-pay | Admitting: Family Medicine

## 2014-10-14 VITALS — BP 126/85 | HR 74 | Temp 98.2°F | Resp 16 | Ht 60.0 in | Wt 219.8 lb

## 2014-10-14 DIAGNOSIS — Z119 Encounter for screening for infectious and parasitic diseases, unspecified: Secondary | ICD-10-CM | POA: Diagnosis not present

## 2014-10-14 DIAGNOSIS — I10 Essential (primary) hypertension: Secondary | ICD-10-CM

## 2014-10-14 DIAGNOSIS — Z131 Encounter for screening for diabetes mellitus: Secondary | ICD-10-CM

## 2014-10-14 DIAGNOSIS — R5383 Other fatigue: Secondary | ICD-10-CM | POA: Diagnosis not present

## 2014-10-14 DIAGNOSIS — Z0000 Encounter for general adult medical examination without abnormal findings: Secondary | ICD-10-CM

## 2014-10-14 DIAGNOSIS — Z23 Encounter for immunization: Secondary | ICD-10-CM

## 2014-10-14 LAB — HEPATITIS C ANTIBODY: HCV Ab: NEGATIVE

## 2014-10-14 LAB — TSH: TSH: 2.481 u[IU]/mL (ref 0.350–4.500)

## 2014-10-14 MED ORDER — LISINOPRIL-HYDROCHLOROTHIAZIDE 20-12.5 MG PO TABS: 1.0000 | ORAL_TABLET | Freq: Two times a day (BID) | ORAL | Status: DC

## 2014-10-14 NOTE — Progress Notes (Signed)
[facility] [address], [place] [address] [phone]  Date:  10/14/2014   Name:  Samantha Pearson   DOB:  08-12-1960   MRN:  [id number]  PCP:  Tally Due, MD    Chief Complaint: Hypertension   History of Present Illness:  Samantha Pearson is a 54 y.o. very pleasant female patient who presents with the following:  Here today for PE/ to follow-up on her HTN.  She has generally worked a lot of hours which contributes to her stress.  At her last visit we increased her lisinopril from 20 to 40mg , and she is also on HCTZ.  However at her last visit my partner Dr. Conley Rolls changed her to lisinopril/ hctz BID.  She is doing well with this regimen  She had a tdap in 2006- needs a regular tetanus today Most recent labs in May of this year- looked ok, her A1c was 5.8  Her home BP "has been crazy." she continues to work excessively, and is currently doing 12- 14 hour days at her regular job in addition to her part time employment.  Her BP last week was 126/93 She would like to exercise but her schedule truly does not allow it  Her mammo is UTD  Her GYN does her pap- this was done this year.    She is not fasting today- she did take her BP med this am as well  BP Readings from Last 3 Encounters:  10/14/14 155/82  09/07/14 178/94  06/08/14 160/88     Patient Active Problem List   Diagnosis Date Noted  . Obesity, unspecified 08/18/2013  . Stress 05/22/2011  . ESSENTIAL HYPERTENSION, BENIGN 08/07/2008  . CHEST PAIN 08/07/2008    Past Medical History  Diagnosis Date  . Chest pain 08/03/08    Urgent care visit  . Hypertension   . Anxiety   . Edema   . Dyspnea   . Obesity   . Allergy   . Depression   . Arthritis   . Heart murmur     Past Surgical History  Procedure Laterality Date  . Cesarean section    . Dilation and curettage of uterus      laparoscopic    Social History  Substance Use Topics  . Smoking status: Never Smoker   . Smokeless  tobacco: Never Used     Comment: Nonsmoker  . Alcohol Use: No    Family History  Problem Relation Age of Onset  . Heart failure Father     CHF  . Colon cancer Maternal Uncle   . Esophageal cancer Neg Hx   . Stomach cancer Neg Hx   . Rectal cancer Neg Hx   . Colon polyps Mother     Allergies  Allergen Reactions  . Doxycycline Hives, Itching and Swelling  . Septra [Bactrim] Hives, Itching and Swelling  . Latex Swelling and Rash    Medication list has been reviewed and updated.  Current Outpatient Prescriptions on File Prior to Visit  Medication Sig Dispense Refill  . acetaminophen (TYLENOL) 500 MG tablet Take 2 tablets (1,000 mg total) by mouth every 8 (eight) hours as needed. 30 tablet 0  . BLACK COHOSH EXTRACT PO Take by mouth daily.    . Calcium Carbonate-Vitamin D (CALCIUM + D PO) Take by mouth daily.    . Glucosamine-Chondroitin (GLUCOSAMINE CHONDR COMPLEX PO) Take by mouth daily.    Marland Kitchen lisinopril-hydrochlorothiazide (PRINZIDE,ZESTORETIC) 20-12.5 MG per tablet Take 1 tablet  by mouth daily. 90 tablet 1  . magnesium 30 MG tablet Take 30 mg by mouth 2 (two) times daily.    . Multiple Vitamin (MULTIVITAMIN) tablet Take 1 tablet by mouth daily.    Marland Kitchen OVER THE COUNTER MEDICATION OTC Vitamin  B12 500 mg taking daily     Current Facility-Administered Medications on File Prior to Visit  Medication Dose Route Frequency Provider Last Rate Last Dose  . ibuprofen (ADVIL,MOTRIN) tablet 400 mg  400 mg Oral Once Pearline Cables, MD        Review of Systems:  As per HPI- otherwise negative.   Physical Examination: Filed Vitals:   10/14/14 0908  BP: 155/82  Pulse: 74  Temp: 98.2 F (36.8 C)  Resp: 16   Filed Vitals:   10/14/14 0908  Height: 5' (1.524 m)  Weight: 219 lb 12.8 oz (99.701 kg)   Body mass index is 42.93 kg/(m^2). Ideal Body Weight: Weight in (lb) to have BMI = 25: 127.7  GEN: WDWN, NAD, Non-toxic, A & O x 3, obese, looks well HEENT: Atraumatic,  Normocephalic. Neck supple. No masses, No LAD.  Bilateral TM wnl, oropharynx normal.  PEERL,EOMI.   Ears and Nose: No external deformity. CV: RRR, No M/G/R. No JVD. No thrill. No extra heart sounds. PULM: CTA B, no wheezes, crackles, rhonchi. No retractions. No resp. distress. No accessory muscle use. ABD: S, NT, ND EXTR: No c/c/e NEURO Normal gait.  PSYCH: Normally interactive. Conversant. Not depressed or anxious appearing.  Calm demeanor.    Assessment and Plan: Physical exam  Immunization due - Plan: Td vaccine greater than or equal to 7yo preservative free IM  Screening for diabetes mellitus - Plan: Hemoglobin A1c  Screening examination for infectious disease - Plan: Hepatitis C antibody  Other fatigue - Plan: TSH  Essential hypertension - Plan: lisinopril-hydrochlorothiazide (PRINZIDE,ZESTORETIC) 20-12.5 MG per tablet  BP controlled with current regimen- refilled medication Await labs She does not have much time to exercise- she is trying to reduce her hours at work which I agree is likely her best bet.   Updated td Declines flu shot today  Signed Abbe Amsterdam, MD

## 2014-10-14 NOTE — Patient Instructions (Signed)
Good to see you today Your BP looks good!   I will be in touch with your labs- if your A1c is running high we can think about starting metformin (this can help prevent diabetes and also can help with weight loss)

## 2014-10-15 ENCOUNTER — Encounter: Payer: Self-pay | Admitting: Family Medicine

## 2014-10-15 LAB — HEMOGLOBIN A1C
HEMOGLOBIN A1C: 5.7 % — AB (ref <5.7)
Mean Plasma Glucose: 117 mg/dL — ABNORMAL HIGH (ref <117)

## 2014-12-01 ENCOUNTER — Telehealth: Payer: Self-pay

## 2014-12-01 NOTE — Telephone Encounter (Signed)
Pt dropped off form to be completed by Dr. Patsy Lageropland. I put the form in the nurses's box for completion. She said her company needs Dr. Cyndie Chimeopland's signature. CB when ready for pick up. CB [phone]

## 2014-12-02 ENCOUNTER — Telehealth: Payer: Self-pay

## 2014-12-02 NOTE — Telephone Encounter (Signed)
Did form for her- called and let her know she can pick it up.  LMOM

## 2014-12-02 NOTE — Telephone Encounter (Signed)
Pt came by tonight to pick up her form signed by Dr Mirian Moopland,clinical and clerical staff were looking for Letter(including going tl 104) but we never found it  Pt is coming back by 6 tomorrow night to see if we found the form

## 2014-12-02 NOTE — Telephone Encounter (Signed)
Form in Dr. Cyndie Chimeopland's box.

## 2014-12-03 NOTE — Telephone Encounter (Signed)
Dr. Patsy Lageropland, do you know anything about this?

## 2014-12-03 NOTE — Telephone Encounter (Signed)
Found form- called her and LMOM that we have it.  Apologized for her trouble!

## 2014-12-14 ENCOUNTER — Ambulatory Visit: Payer: BLUE CROSS/BLUE SHIELD | Admitting: Family Medicine

## 2014-12-16 ENCOUNTER — Ambulatory Visit: Payer: BLUE CROSS/BLUE SHIELD | Admitting: Family Medicine

## 2015-01-06 ENCOUNTER — Encounter: Payer: Self-pay | Admitting: Family Medicine

## 2015-02-07 LAB — HM COLONOSCOPY

## 2015-04-19 ENCOUNTER — Ambulatory Visit (INDEPENDENT_AMBULATORY_CARE_PROVIDER_SITE_OTHER): Payer: BLUE CROSS/BLUE SHIELD | Admitting: Family Medicine

## 2015-04-19 VITALS — BP 162/90 | HR 77 | Temp 98.8°F | Resp 20 | Ht 60.0 in | Wt 218.6 lb

## 2015-04-19 DIAGNOSIS — J209 Acute bronchitis, unspecified: Secondary | ICD-10-CM

## 2015-04-19 MED ORDER — HYDROCODONE-HOMATROPINE 5-1.5 MG/5ML PO SYRP
5.0000 mL | ORAL_SOLUTION | Freq: Three times a day (TID) | ORAL | Status: DC | PRN
Start: 2015-04-19 — End: 2015-05-21

## 2015-04-19 MED ORDER — BUDESONIDE-FORMOTEROL FUMARATE 160-4.5 MCG/ACT IN AERO: 2.0000 | INHALATION_SPRAY | Freq: Two times a day (BID) | RESPIRATORY_TRACT | Status: DC

## 2015-04-19 MED ORDER — AZITHROMYCIN 250 MG PO TABS: ORAL_TABLET | ORAL | Status: DC

## 2015-04-19 NOTE — Progress Notes (Signed)
   Subjective:    Patient ID: Samantha Pearson, female    DOB: [birth year], 55 y.o.   MRN: [id number] By signing my name below, I, Littie Deedsichard Sun, attest that this documentation has been prepared under the direction and in the presence of Elvina SidleKurt Ashani Pumphrey, MD.  Electronically Signed: Littie Deedsichard Sun, Medical Scribe. 04/19/2015. 11:50 AM.  HPI HPI Comments: Samantha Pearson is a 55 y.o. female who [facility] complaining of gradual onset cough that started 4 days ago. Patient reports having associated wheezing and chills. She has been taking Mucinex for her symptoms. She denies history of asthma. Patient notes that one of her coworkers had been ill last week with influenza and possible pneumonia.  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  Review of Systems  Constitutional: Positive for chills.  Respiratory: Positive for cough and wheezing.        Objective:   Physical Exam CONSTITUTIONAL: Well developed/well nourished HEAD: Normocephalic/atraumatic EYES: EOM/PERRL ENMT: Mucous membranes moist. Throat red without exudates or swelling. NECK: supple no meningeal signs SPINE: entire spine nontender CV: S1/S2 noted, no murmurs/rubs/gallops noted LUNGS: Expiratory wheezes ABDOMEN: soft, nontender, no rebound or guarding GU: no cva tenderness NEURO: Pt is awake/alert, moves all extremitiesx4 EXTREMITIES: pulses normal, full ROM SKIN: warm, color normal PSYCH: no abnormalities of mood noted      Assessment & Plan:   This chart was scribed in my presence and reviewed by me personally.    ICD-9-CM ICD-10-CM   1. Acute bronchitis, unspecified organism 466.0 J20.9 budesonide-formoterol (SYMBICORT) 160-4.5 MCG/ACT inhaler     azithromycin (ZITHROMAX) 250 MG tablet     HYDROcodone-homatropine (HYCODAN) 5-1.5 MG/5ML syrup     Signed, Elvina SidleKurt Maxson Oddo, MD

## 2015-04-19 NOTE — Patient Instructions (Addendum)
   IF you received an x-ray today, you will receive an invoice from [facility]. Please contact [facility] at [phone] with questions or concerns regarding your invoice.   IF you received labwork today, you will receive an invoice from [facility]. Please contact [facility] at [phone] with questions or concerns regarding your invoice.   Our billing staff will not be able to assist you with questions regarding bills from these companies.  You will be contacted with the lab results as soon as they are available. The fastest way to get your results is to activate your My Chart account. Instructions are located on the last page of this paperwork. If you have not heard from us regarding the results in 2 weeks, please contact this office.    Acute Bronchitis Bronchitis is inflammation of the airways that extend from the windpipe into the lungs (bronchi). The inflammation often causes mucus to develop. This leads to a cough, which is the most common symptom of bronchitis.  In acute bronchitis, the condition usually develops suddenly and goes away over time, usually in a couple weeks. Smoking, allergies, and asthma can make bronchitis worse. Repeated episodes of bronchitis may cause further lung problems.  CAUSES Acute bronchitis is most often caused by the same virus that causes a cold. The virus can spread from person to person (contagious) through coughing, sneezing, and touching contaminated objects. SIGNS AND SYMPTOMS   Cough.   Fever.   Coughing up mucus.   Body aches.   Chest congestion.   Chills.   Shortness of breath.   Sore throat.  DIAGNOSIS  Acute bronchitis is usually diagnosed through a physical exam. Your health care provider will also ask you questions about your medical history. Tests, such as chest X-rays, are sometimes done to rule out other conditions.  TREATMENT  Acute bronchitis usually goes away in a  couple weeks. Oftentimes, no medical treatment is necessary. Medicines are sometimes given for relief of fever or cough. Antibiotic medicines are usually not needed but may be prescribed in certain situations. In some cases, an inhaler may be recommended to help reduce shortness of breath and control the cough. A cool mist vaporizer may also be used to help thin bronchial secretions and make it easier to clear the chest.  HOME CARE INSTRUCTIONS  Get plenty of rest.   Drink enough fluids to keep your urine clear or pale yellow (unless you have a medical condition that requires fluid restriction). Increasing fluids may help thin your respiratory secretions (sputum) and reduce chest congestion, and it will prevent dehydration.   Take medicines only as directed by your health care provider.  If you were prescribed an antibiotic medicine, finish it all even if you start to feel better.  Avoid smoking and secondhand smoke. Exposure to cigarette smoke or irritating chemicals will make bronchitis worse. If you are a smoker, consider using nicotine gum or skin patches to help control withdrawal symptoms. Quitting smoking will help your lungs heal faster.   Reduce the chances of another bout of acute bronchitis by washing your hands frequently, avoiding people with cold symptoms, and trying not to touch your hands to your mouth, nose, or eyes.   Keep all follow-up visits as directed by your health care provider.  SEEK MEDICAL CARE IF: Your symptoms do not improve after 1 week of treatment.  SEEK IMMEDIATE MEDICAL CARE IF:  You develop an increased fever or chills.   You have chest pain.     You have severe shortness of breath.  You have bloody sputum.   You develop dehydration.  You faint or repeatedly feel like you are going to pass out.  You develop repeated vomiting.  You develop a severe headache. MAKE SURE YOU:   Understand these instructions.  Will watch your  condition.  Will get help right away if you are not doing well or get worse.   This information is not intended to replace advice given to you by your health care provider. Make sure you discuss any questions you have with your health care provider.   Document Released: 03/02/2004 Document Revised: 02/13/2014 Document Reviewed: 07/16/2012 Elsevier Interactive Patient Education 2016 [employer] Inc[employer]  

## 2015-04-26 ENCOUNTER — Telehealth: Payer: Self-pay

## 2015-04-26 NOTE — Telephone Encounter (Signed)
PA needed for Symbicort Rxd for acute bronchitis. Completed on phone w/[employer] [place] and received approval for 2 yrs. Notified pharm.

## 2015-05-21 ENCOUNTER — Ambulatory Visit (INDEPENDENT_AMBULATORY_CARE_PROVIDER_SITE_OTHER): Payer: BLUE CROSS/BLUE SHIELD | Admitting: Physician Assistant

## 2015-05-21 VITALS — BP 140/82 | HR 114 | Temp 98.3°F | Resp 17 | Ht 60.0 in | Wt 224.0 lb

## 2015-05-21 DIAGNOSIS — J209 Acute bronchitis, unspecified: Secondary | ICD-10-CM | POA: Diagnosis not present

## 2015-05-21 DIAGNOSIS — J0100 Acute maxillary sinusitis, unspecified: Secondary | ICD-10-CM | POA: Diagnosis not present

## 2015-05-21 DIAGNOSIS — H6122 Impacted cerumen, left ear: Secondary | ICD-10-CM | POA: Diagnosis not present

## 2015-05-21 DIAGNOSIS — J302 Other seasonal allergic rhinitis: Secondary | ICD-10-CM | POA: Diagnosis not present

## 2015-05-21 MED ORDER — GUAIFENESIN ER 1200 MG PO TB12: 1.0000 | ORAL_TABLET | Freq: Two times a day (BID) | ORAL | Status: AC

## 2015-05-21 MED ORDER — AMOXICILLIN 875 MG PO TABS
875.0000 mg | ORAL_TABLET | Freq: Two times a day (BID) | ORAL | Status: DC
Start: 2015-05-21 — End: 2015-08-18

## 2015-05-21 MED ORDER — MOMETASONE FUROATE 50 MCG/ACT NA SUSP: 2.0000 | Freq: Every day | NASAL | Status: DC

## 2015-05-21 MED ORDER — HYDROCODONE-HOMATROPINE 5-1.5 MG/5ML PO SYRP: 5.0000 mL | ORAL_SOLUTION | Freq: Three times a day (TID) | ORAL | Status: DC | PRN

## 2015-05-21 NOTE — Progress Notes (Signed)
Samantha Pearson  MRN: [id number] DOB: 12-05-1960  Subjective:  Pt presents to clinic with problems with her allergies.  She has been having trouble for the last week or so and she has been using zyrtec but she is still having problems.  She is having itching eyes and sinus pressure with headaches and sinus pain mainly on her left side. She is having green rhinorrhea and when she coughs it is green but she feels like it is coming from her PND.  Her left ear is muffled with the hearing.  Patient Active Problem List   Diagnosis Date Noted  . Obesity, unspecified 08/18/2013  . Stress 05/22/2011  . ESSENTIAL HYPERTENSION, BENIGN 08/07/2008  . CHEST PAIN 08/07/2008    Current Outpatient Prescriptions on File Prior to Visit  Medication Sig Dispense Refill  . acetaminophen (TYLENOL) 500 MG tablet Take 2 tablets (1,000 mg total) by mouth every 8 (eight) hours as needed. 30 tablet 0  . Calcium Carbonate-Vitamin D (CALCIUM + D PO) Take by mouth daily.    . Glucosamine-Chondroitin (GLUCOSAMINE CHONDR COMPLEX PO) Take by mouth daily.    Marland Kitchen. lisinopril-hydrochlorothiazide (PRINZIDE,ZESTORETIC) 20-12.5 MG per tablet Take 1 tablet by mouth 2 (two) times daily. 180 tablet 3  . magnesium 30 MG tablet Take 30 mg by mouth 2 (two) times daily.    . Multiple Vitamin (MULTIVITAMIN) tablet Take 1 tablet by mouth daily.    Marland Kitchen. OVER THE COUNTER MEDICATION OTC Vitamin  B12 500 mg taking daily     No current facility-administered medications on file prior to visit.    Allergies  Allergen Reactions  . Doxycycline Hives, Itching and Swelling  . Septra [Bactrim] Hives, Itching and Swelling  . Latex Swelling and Rash    Review of Systems  Constitutional: Negative for fever and chills.  HENT: Positive for congestion, ear pain (left ear), hearing loss (muffled left ear), postnasal drip, rhinorrhea (green for a week) and sinus pressure (left side).   Respiratory: Positive for cough (green for 3 days). Negative for  shortness of breath and wheezing.   Allergic/Immunologic: Positive for environmental allergies.  Neurological: Positive for headaches.   Objective:  BP 140/82 mmHg  Pulse 114  Temp(Src) 98.3 F (36.8 C) (Oral)  Resp 17  Ht 5' (1.524 m)  Wt 224 lb (101.606 kg)  BMI 43.75 kg/m2  SpO2 97%  LMP 04/09/2014  Physical Exam  Constitutional: She is oriented to person, place, and time and well-developed, well-nourished, and in no distress.  HENT:  Head: Normocephalic and atraumatic.  Right Ear: Hearing, tympanic membrane, external ear and ear canal normal.  Left Ear: Hearing, tympanic membrane and external ear normal. A foreign body (cerumen impaction - removed with lavage) is present.  Nose: Mucosal edema (pale) present.  Mouth/Throat: Uvula is midline, oropharynx is clear and moist and mucous membranes are normal.  Eyes: Conjunctivae are normal.  Neck: Normal range of motion.  Cardiovascular: Normal rate, regular rhythm and normal heart sounds.   No murmur heard. Pulmonary/Chest: Effort normal and breath sounds normal.  Neurological: She is alert and oriented to person, place, and time. Gait normal.  Skin: Skin is warm and dry.  Psychiatric: Mood, memory, affect and judgment normal.  Vitals reviewed.   Assessment and Plan :  Acute maxillary sinusitis, recurrence not specified - Plan: Guaifenesin (MUCINEX MAXIMUM STRENGTH) 1200 MG TB12, amoxicillin (AMOXIL) 875 MG tablet  Seasonal allergies - Plan: mometasone (NASONEX) 50 MCG/ACT nasal spray  Cerumen debris on tympanic membrane of  left ear - Plan: Ear wax removal  Acute bronchitis, unspecified organism - Plan: HYDROcodone-homatropine (HYCODAN) 5-1.5 MG/5ML syrup  Add nasonex to her current allergy medications to help with nasal congestion and ETD and we will treat her sinus infection.  She will continue symptomatic care.  Benny Lennert PA-C  [facility] 05/21/2015 10:48 AM

## 2015-05-21 NOTE — Patient Instructions (Signed)
     IF you received an x-ray today, you will receive an invoice from [facility]. Please contact [facility] at [phone] with questions or concerns regarding your invoice.   IF you received labwork today, you will receive an invoice from [facility]. Please contact [facility] at [phone] with questions or concerns regarding your invoice.   Our billing staff will not be able to assist you with questions regarding bills from these companies.  You will be contacted with the lab results as soon as they are available. The fastest way to get your results is to activate your My Chart account. Instructions are located on the last page of this paperwork. If you have not heard from us regarding the results in 2 weeks, please contact this office.      

## 2015-05-26 ENCOUNTER — Telehealth: Payer: Self-pay

## 2015-05-26 NOTE — Telephone Encounter (Signed)
Sarah, nasonex is not covered by ins, requires a PA, and I don't see that pt has failed any other NS? Can we change to flonase which is normally covered?

## 2015-05-27 MED ORDER — TRIAMCINOLONE ACETONIDE 55 MCG/ACT NA AERO: 2.0000 | INHALATION_SPRAY | Freq: Every day | NASAL | Status: DC

## 2015-05-27 NOTE — Telephone Encounter (Signed)
I changed to nasocort which is water based.

## 2015-08-18 ENCOUNTER — Ambulatory Visit (INDEPENDENT_AMBULATORY_CARE_PROVIDER_SITE_OTHER): Payer: BLUE CROSS/BLUE SHIELD

## 2015-08-18 ENCOUNTER — Ambulatory Visit (INDEPENDENT_AMBULATORY_CARE_PROVIDER_SITE_OTHER): Payer: BLUE CROSS/BLUE SHIELD | Admitting: Urgent Care

## 2015-08-18 DIAGNOSIS — M47819 Spondylosis without myelopathy or radiculopathy, site unspecified: Secondary | ICD-10-CM | POA: Diagnosis not present

## 2015-08-18 DIAGNOSIS — M62830 Muscle spasm of back: Secondary | ICD-10-CM | POA: Diagnosis not present

## 2015-08-18 DIAGNOSIS — S3992XA Unspecified injury of lower back, initial encounter: Secondary | ICD-10-CM | POA: Diagnosis not present

## 2015-08-18 DIAGNOSIS — M545 Low back pain: Secondary | ICD-10-CM

## 2015-08-18 LAB — POCT URINALYSIS DIP (MANUAL ENTRY)
BILIRUBIN UA: NEGATIVE
Glucose, UA: NEGATIVE
Ketones, POC UA: NEGATIVE
NITRITE UA: NEGATIVE
PH UA: 7
PROTEIN UA: NEGATIVE
Spec Grav, UA: 1.02
Urobilinogen, UA: 0.2

## 2015-08-18 LAB — POC MICROSCOPIC URINALYSIS (UMFC): Mucus: ABSENT

## 2015-08-18 MED ORDER — NAPROXEN SODIUM 550 MG PO TABS: 550.0000 mg | ORAL_TABLET | Freq: Two times a day (BID) | ORAL | Status: DC

## 2015-08-18 MED ORDER — CYCLOBENZAPRINE HCL 5 MG PO TABS: 5.0000 mg | ORAL_TABLET | Freq: Three times a day (TID) | ORAL | Status: DC | PRN
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
## 2015-11-24 DIAGNOSIS — Z1231 Encounter for screening mammogram for malignant neoplasm of breast: Secondary | ICD-10-CM | POA: Diagnosis not present

## 2015-11-27 ENCOUNTER — Ambulatory Visit (INDEPENDENT_AMBULATORY_CARE_PROVIDER_SITE_OTHER): Payer: BLUE CROSS/BLUE SHIELD | Admitting: Physician Assistant

## 2015-11-27 VITALS — BP 124/86 | HR 82 | Temp 98.0°F | Resp 20 | Ht 60.0 in | Wt 224.8 lb

## 2015-11-27 DIAGNOSIS — E6609 Other obesity due to excess calories: Secondary | ICD-10-CM

## 2015-11-27 DIAGNOSIS — Z6841 Body Mass Index (BMI) 40.0 and over, adult: Secondary | ICD-10-CM | POA: Diagnosis not present

## 2015-11-27 DIAGNOSIS — IMO0001 Reserved for inherently not codable concepts without codable children: Secondary | ICD-10-CM

## 2015-11-27 DIAGNOSIS — I10 Essential (primary) hypertension: Secondary | ICD-10-CM

## 2015-11-27 DIAGNOSIS — Z130 Encounter for screening for diseases of the blood and blood-forming organs and certain disorders involving the immune mechanism: Secondary | ICD-10-CM

## 2015-11-27 DIAGNOSIS — Z13220 Encounter for screening for lipoid disorders: Secondary | ICD-10-CM | POA: Diagnosis not present

## 2015-11-27 DIAGNOSIS — Z0000 Encounter for general adult medical examination without abnormal findings: Secondary | ICD-10-CM

## 2015-11-27 DIAGNOSIS — Z13228 Encounter for screening for other metabolic disorders: Secondary | ICD-10-CM

## 2015-11-27 MED ORDER — LISINOPRIL-HYDROCHLOROTHIAZIDE 20-12.5 MG PO TABS: 1.0000 | ORAL_TABLET | Freq: Every day | ORAL | 1 refills | Status: DC

## 2015-11-27 NOTE — Progress Notes (Signed)
Samantha Pearson  MRN: [id number] DOB: 07-23-1960  Subjective:  Pt presents to clinic for a CPE.  Last dental exam: long time Last vision exam: 2016 Last pap smear: every 2 years - Dr Jeanne Ivan Last mammogram: 11/24/2015 - [facility] Last colonoscopy: 04/2014 - every 10 years Vaccinations      Tetanus - UTD  Diet - poor - drinks water and sweet tea rare, Sprite  Recent increase in stress - other just diagnosed with breast Cancer - she is in hospital in isolation due to immune system problems and patient cannot currently see her because she has had a cold - pt works 2 jobs  Patient Active Problem List   Diagnosis Date Noted  . Class 3 obesity due to excess calories with serious comorbidity and body mass index (BMI) of 40.0 to 44.9 in adult (HCC) 08/18/2013  . Stress 05/22/2011  . ESSENTIAL HYPERTENSION, BENIGN 08/07/2008    Current Outpatient Prescriptions on File Prior to Visit  Medication Sig Dispense Refill  . acetaminophen (TYLENOL) 500 MG tablet Take 2 tablets (1,000 mg total) by mouth every 8 (eight) hours as needed. 30 tablet 0  . Calcium Carbonate-Vitamin D (CALCIUM + D PO) Take by mouth daily.    . cyclobenzaprine (FLEXERIL) 5 MG tablet Take 1 tablet (5 mg total) by mouth 3 (three) times daily as needed for muscle spasms. 30 tablet 1  . Glucosamine-Chondroitin (GLUCOSAMINE CHONDR COMPLEX PO) Take by mouth daily.    . magnesium 30 MG tablet Take 30 mg by mouth 2 (two) times daily.    . Multiple Vitamin (MULTIVITAMIN) tablet Take 1 tablet by mouth daily.    . naproxen sodium (ANAPROX DS) 550 MG tablet Take 1 tablet (550 mg total) by mouth 2 (two) times daily with a meal. 30 tablet 1  . OVER THE COUNTER MEDICATION OTC Vitamin  B12 500 mg taking daily     No current facility-administered medications on file prior to visit.     Allergies  Allergen Reactions  . Doxycycline Hives, Itching and Swelling  . Septra [Bactrim] Hives, Itching and Swelling  . Latex Swelling and Rash     Social History   Social History  . Marital status: Single    Spouse name: N/A  . Number of children: 1  . Years of education: N/A   Occupational History  . 3 jobs     International aid/development worker   Social History Main Topics  . Smoking status: Never Smoker  . Smokeless tobacco: Never Used     Comment: Nonsmoker  . Alcohol use No  . Drug use: No  . Sexual activity: Yes    Birth control/ protection: None   Other Topics Concern  . 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 CESAREAN SECTION    . DILATION AND CURETTAGE OF UTERUS     laparoscopic    Family History  Problem Relation Age of Onset  . Heart failure Father  CHF  . Colon cancer Maternal Uncle   . Colon polyps Mother   . Cancer Mother 871    breast cancer  . Hypertension Sister   . Hypertension Brother   . Hypertension Sister   . Hypertension Sister   . Hypertension Sister   . Esophageal cancer Neg Hx   . Stomach cancer Neg Hx   . Rectal cancer Neg Hx     Review of Systems  Constitutional: Negative.   HENT: Negative.   Eyes: Negative.   Respiratory: Negative.   Cardiovascular: Negative.   Gastrointestinal: Negative.   Endocrine: Negative.   Genitourinary: Negative.   Musculoskeletal: Negative.   Skin: Negative.   Allergic/Immunologic: Negative.   Neurological: Negative.   Hematological: Negative.   Psychiatric/Behavioral: Negative.    Objective:  BP 124/86   Pulse 82   Temp 98 F (36.7 C) (Oral)   Resp 20   Ht 5' (1.524 m)   Wt 224 lb 12.8 oz (102 kg)   LMP 04/09/2014   SpO2 98%   BMI 43.90 kg/m   Physical Exam   Constitutional: She is oriented to person, place, and time and well-developed, well-nourished, and in no distress.  HENT:  Head: Normocephalic and atraumatic.  Right Ear: Hearing, tympanic membrane, external ear and ear canal normal.  Left Ear: Hearing, tympanic membrane, external ear and ear canal normal.  Nose: Nose normal.  Mouth/Throat: Uvula is midline, oropharynx is clear and moist and mucous membranes are normal.  Eyes: Conjunctivae and EOM are normal. Pupils are equal, round, and reactive to light.  Neck: Trachea normal and normal range of motion. Neck supple. No thyroid mass and no thyromegaly present.  Cardiovascular: Normal rate, regular rhythm and normal heart sounds.   No murmur heard. Pulmonary/Chest: Effort normal and breath sounds normal. She has no wheezes.  Abdominal: Soft. Bowel sounds are normal. There is no tenderness.  Musculoskeletal: Normal range of motion.  Lymphadenopathy:    She has no cervical adenopathy.  Neurological: She is alert and oriented to person, place, and time. She has normal motor skills, normal sensation, normal strength and normal reflexes. Gait normal.  Skin: Skin is warm and dry.  Psychiatric: Mood, memory, affect and judgment normal.    Visual Acuity Screening   Right eye Left eye Both eyes  Without correction: 20/25 20/25 20/20   With correction:       Assessment and Plan :  Annual physical exam -  Anticipatory guidance  Screening for deficiency anemia - Plan: CBC with Differential/Platelet  Screening for metabolic disorder  Essential hypertension - Plan: COMPLETE METABOLIC PANEL WITH GFR, TSH, Ambulatory referral to diabetic education, lisinopril-hydrochlorothiazide (PRINZIDE,ZESTORETIC) 20-12.5 MG tablet - well controlled  Screening cholesterol level - Plan: Lipid panel  Class 3 obesity due to excess calories with serious comorbidity and body mass index (BMI) of 40.0 to 44.9 in adult [facility](HCC) - Plan: Ambulatory referral to diabetic  education - d/w pt weight loss strategies - she will likely have trouble due to her 3rd shift work and irregular sleep - pt to recheck in 4-6 weeks for accountability for weight loss - pt to keep record of eating habits - try to decrease sugary drinks  Benny LennertSarah Weber PA-C  [facility] 11/27/2015 3:52 PM

## 2015-11-27 NOTE — Patient Instructions (Addendum)
Make a dental appt Make a eye visit for this year  My Fitness Pal - 1500 calories a day - try to get 80g of protein a day to help you stay full  I will contact you with your lab results as soon as they are available.   If you have not heard from me in 2 weeks, please contact me.  The fastest way to get your results is to register for My Chart (see the instructions on the last page of this printout).  IF you received an x-ray today, you will receive an invoice from [facility]. Please contact [facility] at [phone] with questions or concerns regarding your invoice.   IF you received labwork today, you will receive an invoice from [employer]. Please contact [facility] at [phone] with questions or concerns regarding your invoice.   Our billing staff will not be able to assist you with questions regarding bills from these companies.  You will be contacted with the lab results as soon as they are available. The fastest way to get your results is to activate your My Chart account. Instructions are located on the last page of this paperwork. If you have not heard from [place] regarding the results in 2 weeks, please contact this office.     Health Maintenance, Female Adopting a healthy lifestyle and getting preventive care can go a long way to promote health and wellness. Talk with your health care provider about what schedule of regular examinations is right for you. This is a good chance for you to check in with your provider about disease prevention and staying healthy. In between checkups, there are plenty of things you can do on your own. Experts have done a lot of research about which lifestyle changes and preventive measures are most likely to keep you healthy. Ask your health care provider for more information. WEIGHT AND DIET  Eat a healthy diet  Be sure to include plenty of vegetables, fruits, low-fat dairy products, and lean protein.  Do  not eat a lot of foods high in solid fats, added sugars, or salt.  Get regular exercise. This is one of the most important things you can do for your health.  Most adults should exercise for at least 150 minutes each week. The exercise should increase your heart rate and make you sweat (moderate-intensity exercise).  Most adults should also do strengthening exercises at least twice a week. This is in addition to the moderate-intensity exercise.  Maintain a healthy weight  Body mass index (BMI) is a measurement that can be used to identify possible weight problems. It estimates body fat based on height and weight. Your health care provider can help determine your BMI and help you achieve or maintain a healthy weight.  For females 52 years of age and older:   A BMI below 18.5 is considered underweight.  A BMI of 18.5 to 24.9 is normal.  A BMI of 25 to 29.9 is considered overweight.  A BMI of 30 and above is considered obese.  Watch levels of cholesterol and blood lipids  You should start having your blood tested for lipids and cholesterol at 54 years of age, then have this test every 5 years.  You may need to have your cholesterol levels checked more often if:  Your lipid or cholesterol levels are high.  You are older than 54 years of age.  You are at high risk for heart disease.  CANCER SCREENING   Lung  Cancer  Lung cancer screening is recommended for adults 87-36 years old who are at high risk for lung cancer because of a history of smoking.  A yearly low-dose CT scan of the lungs is recommended for people who:  Currently smoke.  Have quit within the past 15 years.  Have at least a 30-pack-year history of smoking. A pack year is smoking an average of one pack of cigarettes a day for 1 year.  Yearly screening should continue until it has been 15 years since you quit.  Yearly screening should stop if you develop a health problem that would prevent you from having  lung cancer treatment.  Breast Cancer  Practice breast self-awareness. This means understanding how your breasts normally appear and feel.  It also means doing regular breast self-exams. Let your health care provider know about any changes, no matter how small.  If you are in your 20s or 30s, you should have a clinical breast exam (CBE) by a health care provider every 1-3 years as part of a regular health exam.  If you are 12 or older, have a CBE every year. Also consider having a breast X-ray (mammogram) every year.  If you have a family history of breast cancer, talk to your health care provider about genetic screening.  If you are at high risk for breast cancer, talk to your health care provider about having an MRI and a mammogram every year.  Breast cancer gene (BRCA) assessment is recommended for women who have family members with BRCA-related cancers. BRCA-related cancers include:  Breast.  Ovarian.  Tubal.  Peritoneal cancers.  Results of the assessment will determine the need for genetic counseling and BRCA1 and BRCA2 testing. Cervical Cancer Your health care provider may recommend that you be screened regularly for cancer of the pelvic organs (ovaries, uterus, and vagina). This screening involves a pelvic examination, including checking for microscopic changes to the surface of your cervix (Pap test). You may be encouraged to have this screening done every 3 years, beginning at age 91.  For women ages 52-65, health care providers may recommend pelvic exams and Pap testing every 3 years, or they may recommend the Pap and pelvic exam, combined with testing for human papilloma virus (HPV), every 5 years. Some types of HPV increase your risk of cervical cancer. Testing for HPV may also be done on women of any age with unclear Pap test results.  Other health care providers may not recommend any screening for nonpregnant women who are considered low risk for pelvic cancer and who  do not have symptoms. Ask your health care provider if a screening pelvic exam is right for you.  If you have had past treatment for cervical cancer or a condition that could lead to cancer, you need Pap tests and screening for cancer for at least 20 years after your treatment. If Pap tests have been discontinued, your risk factors (such as having a new sexual partner) need to be reassessed to determine if screening should resume. Some women have medical problems that increase the chance of getting cervical cancer. In these cases, your health care provider may recommend more frequent screening and Pap tests. Colorectal Cancer  This type of cancer can be detected and often prevented.  Routine colorectal cancer screening usually begins at 54 years of age and continues through 54 years of age.  Your health care provider may recommend screening at an earlier age if you have risk factors for colon cancer.  Your  health care provider may also recommend using home test kits to check for hidden blood in the stool.  A small camera at the end of a tube can be used to examine your colon directly (sigmoidoscopy or colonoscopy). This is done to check for the earliest forms of colorectal cancer.  Routine screening usually begins at age 33.  Direct examination of the colon should be repeated every 5-10 years through 54 years of age. However, you may need to be screened more often if early forms of precancerous polyps or small growths are found. Skin Cancer  Check your skin from head to toe regularly.  Tell your health care provider about any new moles or changes in moles, especially if there is a change in a mole's shape or color.  Also tell your health care provider if you have a mole that is larger than the size of a pencil eraser.  Always use sunscreen. Apply sunscreen liberally and repeatedly throughout the day.  Protect yourself by wearing long sleeves, pants, a wide-brimmed hat, and sunglasses  whenever you are outside. HEART DISEASE, DIABETES, AND HIGH BLOOD PRESSURE   High blood pressure causes heart disease and increases the risk of stroke. High blood pressure is more likely to develop in:  People who have blood pressure in the high end of the normal range (130-139/85-89 mm Hg).  People who are overweight or obese.  People who are African American.  If you are 21-93 years of age, have your blood pressure checked every 3-5 years. If you are 29 years of age or older, have your blood pressure checked every year. You should have your blood pressure measured twice--once when you are at a hospital or clinic, and once when you are not at a hospital or clinic. Record the average of the two measurements. To check your blood pressure when you are not at a hospital or clinic, you can use:  An automated blood pressure machine at a pharmacy.  A home blood pressure monitor.  If you are between 78 years and 4 years old, ask your health care provider if you should take aspirin to prevent strokes.  Have regular diabetes screenings. This involves taking a blood sample to check your fasting blood sugar level.  If you are at a normal weight and have a low risk for diabetes, have this test once every three years after 54 years of age.  If you are overweight and have a high risk for diabetes, consider being tested at a younger age or more often. PREVENTING INFECTION  Hepatitis B  If you have a higher risk for hepatitis B, you should be screened for this virus. You are considered at high risk for hepatitis B if:  You were born in a country where hepatitis B is common. Ask your health care provider which countries are considered high risk.  Your parents were born in a high-risk country, and you have not been immunized against hepatitis B (hepatitis B vaccine).  You have HIV or AIDS.  You use needles to inject street drugs.  You live with someone who has hepatitis B.  You have had sex  with someone who has hepatitis B.  You get hemodialysis treatment.  You take certain medicines for conditions, including cancer, organ transplantation, and autoimmune conditions. Hepatitis C  Blood testing is recommended for:  Everyone born from [birth year] through 1964.  Anyone with known risk factors for hepatitis C. Sexually transmitted infections (STIs)  You should be screened for sexually transmitted  infections (STIs) including gonorrhea and chlamydia if:  You are sexually active and are younger than 53 years of age.  You are older than 54 years of age and your health care provider tells you that you are at risk for this type of infection.  Your sexual activity has changed since you were last screened and you are at an increased risk for chlamydia or gonorrhea. Ask your health care provider if you are at risk.  If you do not have HIV, but are at risk, it may be recommended that you take a prescription medicine daily to prevent HIV infection. This is called pre-exposure prophylaxis (PrEP). You are considered at risk if:  You are sexually active and do not regularly use condoms or know the HIV status of your partner(s).  You take drugs by injection.  You are sexually active with a partner who has HIV. Talk with your health care provider about whether you are at high risk of being infected with HIV. If you choose to begin PrEP, you should first be tested for HIV. You should then be tested every 3 months for as long as you are taking PrEP.  PREGNANCY   If you are premenopausal and you may become pregnant, ask your health care provider about preconception counseling.  If you may become pregnant, take 400 to 800 micrograms (mcg) of folic acid every day.  If you want to prevent pregnancy, talk to your health care provider about birth control (contraception). OSTEOPOROSIS AND MENOPAUSE   Osteoporosis is a disease in which the bones lose minerals and strength with aging. This can result  in serious bone fractures. Your risk for osteoporosis can be identified using a bone density scan.  If you are 20 years of age or older, or if you are at risk for osteoporosis and fractures, ask your health care provider if you should be screened.  Ask your health care provider whether you should take a calcium or vitamin D supplement to lower your risk for osteoporosis.  Menopause may have certain physical symptoms and risks.  Hormone replacement therapy may reduce some of these symptoms and risks. Talk to your health care provider about whether hormone replacement therapy is right for you.  HOME CARE INSTRUCTIONS   Schedule regular health, dental, and eye exams.  Stay current with your immunizations.   Do not use any tobacco products including cigarettes, chewing tobacco, or electronic cigarettes.  If you are pregnant, do not drink alcohol.  If you are breastfeeding, limit how much and how often you drink alcohol.  Limit alcohol intake to no more than 1 drink per day for nonpregnant women. One drink equals 12 ounces of beer, 5 ounces of wine, or 1 ounces of hard liquor.  Do not use street drugs.  Do not share needles.  Ask your health care provider for help if you need support or information about quitting drugs.  Tell your health care provider if you often feel depressed.  Tell your health care provider if you have ever been abused or do not feel safe at home.   This information is not intended to replace advice given to you by your health care provider. Make sure you discuss any questions you have with your health care provider.   Document Released: 08/08/2010 Document Revised: 02/13/2014 Document Reviewed: 12/25/2012 Elsevier Interactive Patient Education [employer].

## 2015-11-29 LAB — CBC WITH DIFFERENTIAL/PLATELET
BASOS ABS: 0 {cells}/uL (ref 0–200)
Basophils Relative: 0 %
EOS ABS: 330 {cells}/uL (ref 15–500)
Eosinophils Relative: 5 %
HEMATOCRIT: 38.7 % (ref 35.0–45.0)
Hemoglobin: 12.7 g/dL (ref 11.7–15.5)
LYMPHS PCT: 39 %
Lymphs Abs: 2574 cells/uL (ref 850–3900)
MCH: 27.5 pg (ref 27.0–33.0)
MCHC: 32.8 g/dL (ref 32.0–36.0)
MCV: 83.9 fL (ref 80.0–100.0)
MONO ABS: 462 {cells}/uL (ref 200–950)
MONOS PCT: 7 %
MPV: 11.3 fL (ref 7.5–12.5)
NEUTROS PCT: 49 %
Neutro Abs: 3234 cells/uL (ref 1500–7800)
PLATELETS: 274 10*3/uL (ref 140–400)
RBC: 4.61 MIL/uL (ref 3.80–5.10)
RDW: 14 % (ref 11.0–15.0)
WBC: 6.6 10*3/uL (ref 3.8–10.8)

## 2015-11-29 LAB — LIPID PANEL
CHOLESTEROL: 198 mg/dL (ref 125–200)
HDL: 57 mg/dL (ref >=46)
LDL Cholesterol: 98 mg/dL (ref <130)
TRIGLYCERIDES: 217 mg/dL — AB (ref <150)
Total CHOL/HDL Ratio: 3.5 Ratio (ref <=5.0)
VLDL: 43 mg/dL — ABNORMAL HIGH (ref <30)

## 2015-11-29 LAB — COMPLETE METABOLIC PANEL WITH GFR
ALBUMIN: 4.1 g/dL (ref 3.6–5.1)
ALK PHOS: 93 U/L (ref 33–130)
ALT: 19 U/L (ref 6–29)
AST: 23 U/L (ref 10–35)
BUN: 10 mg/dL (ref 7–25)
CALCIUM: 9.7 mg/dL (ref 8.6–10.4)
CO2: 26 mmol/L (ref 20–31)
CREATININE: 0.86 mg/dL (ref 0.50–1.05)
Chloride: 104 mmol/L (ref 98–110)
GFR, EST AFRICAN AMERICAN: 89 mL/min (ref >=60)
GFR, Est Non African American: 77 mL/min (ref >=60)
Glucose, Bld: 107 mg/dL — ABNORMAL HIGH (ref 65–99)
Potassium: 4 mmol/L (ref 3.5–5.3)
Sodium: 140 mmol/L (ref 135–146)
Total Bilirubin: 0.7 mg/dL (ref 0.2–1.2)
Total Protein: 7.3 g/dL (ref 6.1–8.1)

## 2015-11-29 LAB — TSH: TSH: 1.11 mIU/L

## 2015-11-30 ENCOUNTER — Encounter: Payer: Self-pay | Admitting: Physician Assistant

## 2016-03-13 ENCOUNTER — Other Ambulatory Visit: Payer: Self-pay | Admitting: Family Medicine

## 2016-03-13 ENCOUNTER — Other Ambulatory Visit: Payer: Self-pay | Admitting: Emergency Medicine

## 2016-03-13 DIAGNOSIS — I10 Essential (primary) hypertension: Secondary | ICD-10-CM

## 2016-03-13 MED ORDER — LISINOPRIL-HYDROCHLOROTHIAZIDE 20-12.5 MG PO TABS: 1.0000 | ORAL_TABLET | Freq: Every day | ORAL | 0 refills | Status: DC

## 2016-03-14 ENCOUNTER — Ambulatory Visit (INDEPENDENT_AMBULATORY_CARE_PROVIDER_SITE_OTHER): Payer: BLUE CROSS/BLUE SHIELD | Admitting: Physician Assistant

## 2016-03-14 DIAGNOSIS — I10 Essential (primary) hypertension: Secondary | ICD-10-CM

## 2016-03-14 DIAGNOSIS — G8929 Other chronic pain: Secondary | ICD-10-CM | POA: Diagnosis not present

## 2016-03-14 DIAGNOSIS — M25562 Pain in left knee: Secondary | ICD-10-CM | POA: Diagnosis not present

## 2016-03-14 DIAGNOSIS — M62830 Muscle spasm of back: Secondary | ICD-10-CM

## 2016-03-14 MED ORDER — NAPROXEN SODIUM 550 MG PO TABS: 550.0000 mg | ORAL_TABLET | Freq: Two times a day (BID) | ORAL | 1 refills | Status: DC

## 2016-03-14 MED ORDER — CHLORTHALIDONE 25 MG PO TABS: 25.0000 mg | ORAL_TABLET | Freq: Every day | ORAL | 0 refills | Status: DC

## 2016-03-14 MED ORDER — LISINOPRIL 20 MG PO TABS: 20.0000 mg | ORAL_TABLET | Freq: Every day | ORAL | 0 refills | Status: DC

## 2016-03-14 NOTE — Progress Notes (Signed)
Samantha Pearson  MRN: [id number] DOB: [birth year]  Subjective:  Pt presents to clinic for BP medication refill.  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  She is having no CP/SOB.  She has not yet taken her BP medication this am - she takes it in the am when she gets off of work.  BP at home - 120s/80s  Increase stress with taking care of dad - at his home - she just overheard her dad talking on the phone that he wants to her to move out - she recently moved in with him to help take care of him -  Review of Systems  Constitutional: Negative for chills and fever.  Respiratory: Negative for shortness of breath.   Cardiovascular: Negative for chest pain, palpitations and leg swelling.  Neurological: Negative for headaches.    Patient Active Problem List   Diagnosis Date Noted  . Left knee pain 03/14/2016  . Class 3 obesity due to excess calories with serious comorbidity and body mass index (BMI) of 40.0 to 44.9 in adult (HCC) 08/18/2013  . Stress 05/22/2011  . ESSENTIAL HYPERTENSION, BENIGN 08/07/2008    Current Outpatient Prescriptions on File Prior to Visit  Medication Sig Dispense Refill  . acetaminophen (TYLENOL) 500 MG tablet Take 2 tablets (1,000 mg total) by mouth every 8 (eight) hours as needed. 30 tablet 0  . Calcium Carbonate-Vitamin D (CALCIUM + D PO) Take by mouth daily.    . cyclobenzaprine (FLEXERIL) 5 MG tablet Take 1 tablet (5 mg total) by mouth 3 (three) times daily as needed for muscle spasms. 30 tablet 1  . Glucosamine-Chondroitin (GLUCOSAMINE CHONDR COMPLEX PO) Take by mouth daily.    Marland Kitchen. lisinopril-hydrochlorothiazide (PRINZIDE,ZESTORETIC) 20-12.5 MG tablet Take 1 tablet by mouth daily. 30 tablet 0  . magnesium 30 MG tablet Take 30 mg by mouth 2 (two) times daily.    . Multiple Vitamin (MULTIVITAMIN) tablet Take 1 tablet by mouth daily.    Marland Kitchen. OVER THE COUNTER  MEDICATION OTC Vitamin  B12 500 mg taking daily     No current facility-administered medications on file prior to visit.     Allergies  Allergen Reactions  . Doxycycline Hives, Itching and Swelling  . Septra [Bactrim] Hives, Itching and Swelling  . Latex Swelling and Rash    Pt patients past, family and social history were reviewed and updated.   Objective:  BP (!) 150/76 (BP Location: Right Arm, Patient Position: Sitting, Cuff Size: Large)   Pulse 67   Temp 98.3 F (36.8 C) (Oral)   Resp 16   Ht 5' (1.524 m)   Wt 228 lb (103.4 kg)   LMP 04/09/2014   SpO2 99%   BMI 44.53 kg/m   Physical Exam  Constitutional: She is oriented to person, place, and time and well-developed, well-nourished, and in no distress.  HENT:  Head: Normocephalic and atraumatic.  Right Ear: Hearing and external ear normal.  Left Ear: Hearing and external ear normal.  Eyes: Conjunctivae are normal.  Neck: Normal range of motion.  Cardiovascular: Normal rate, regular rhythm and normal heart sounds.   No murmur heard. Pulmonary/Chest: Effort normal and breath sounds normal.  Musculoskeletal:       Right lower leg: She exhibits edema (1+ non-pitting edema).       Left lower leg: She exhibits edema (1+ non-pitting  edema).  Neurological: She is alert and oriented to person, place, and time. Gait normal.  Skin: Skin is warm and dry.  Psychiatric: Mood, memory, affect and judgment normal.  Vitals reviewed.   Assessment and Plan :  Chronic pain of left knee  Muscle spasm of back - Plan: naproxen sodium (ANAPROX DS) 550 MG tablet  Essential hypertension - Plan: lisinopril (PRINIVIL,ZESTRIL) 20 MG tablet, chlorthalidone (HYGROTON) 25 MG tablet - change from HCTZ to chlorthalidone as this should have better all day coverage - she will continue to monitor at home and recheck with me in 3 months due to medication change.  Benny Lennert PA-C  Primary Care at [facility] 03/14/2016 8:50  AM

## 2016-03-14 NOTE — Patient Instructions (Addendum)
Think about signing up for mychart as a way to get in touch with me easy.  Change your BP medication to have longer control in your BP.    IF you received an x-ray today, you will receive an invoice from [facility]. Please contact [facility] at [phone] with questions or concerns regarding your invoice.   IF you received labwork today, you will receive an invoice from [facility]. Please contact [facility] at [phone] with questions or concerns regarding your invoice.   Our billing staff will not be able to assist you with questions regarding bills from these companies.  You will be contacted with the lab results as soon as they are available. The fastest way to get your results is to activate your My Chart account. Instructions are located on the last page of this paperwork. If you have not heard from us regarding the results in 2 weeks, please contact this office.

## 2016-06-12 ENCOUNTER — Telehealth: Payer: Self-pay | Admitting: Internal Medicine

## 2016-06-12 ENCOUNTER — Other Ambulatory Visit: Payer: Self-pay | Admitting: Urgent Care

## 2016-06-12 ENCOUNTER — Ambulatory Visit (INDEPENDENT_AMBULATORY_CARE_PROVIDER_SITE_OTHER): Payer: BLUE CROSS/BLUE SHIELD

## 2016-06-12 ENCOUNTER — Encounter: Payer: Self-pay | Admitting: Urgent Care

## 2016-06-12 ENCOUNTER — Ambulatory Visit (INDEPENDENT_AMBULATORY_CARE_PROVIDER_SITE_OTHER): Payer: BLUE CROSS/BLUE SHIELD | Admitting: Urgent Care

## 2016-06-12 VITALS — BP 175/82 | HR 73 | Temp 98.4°F | Resp 16 | Ht 60.0 in | Wt 225.6 lb

## 2016-06-12 DIAGNOSIS — M7989 Other specified soft tissue disorders: Secondary | ICD-10-CM

## 2016-06-12 DIAGNOSIS — I10 Essential (primary) hypertension: Secondary | ICD-10-CM | POA: Diagnosis not present

## 2016-06-12 DIAGNOSIS — M2540 Effusion, unspecified joint: Secondary | ICD-10-CM

## 2016-06-12 DIAGNOSIS — M25649 Stiffness of unspecified hand, not elsewhere classified: Secondary | ICD-10-CM

## 2016-06-12 DIAGNOSIS — M19042 Primary osteoarthritis, left hand: Secondary | ICD-10-CM | POA: Diagnosis not present

## 2016-06-12 DIAGNOSIS — M79645 Pain in left finger(s): Secondary | ICD-10-CM | POA: Diagnosis not present

## 2016-06-12 DIAGNOSIS — R030 Elevated blood-pressure reading, without diagnosis of hypertension: Secondary | ICD-10-CM

## 2016-06-12 DIAGNOSIS — M19041 Primary osteoarthritis, right hand: Secondary | ICD-10-CM

## 2016-06-12 MED ORDER — PREDNISONE 20 MG PO TABS: 20.0000 mg | ORAL_TABLET | Freq: Every day | ORAL | 0 refills | Status: DC

## 2016-06-12 NOTE — Patient Instructions (Addendum)
Hypertension Hypertension, commonly called high blood pressure, is when the force of blood pumping through the arteries is too strong. The arteries are the blood vessels that carry blood from the heart throughout the body. Hypertension forces the heart to work harder to pump blood and may cause arteries to become narrow or stiff. Having untreated or uncontrolled hypertension can cause heart attacks, strokes, kidney disease, and other problems. A blood pressure reading consists of a higher number over a lower number. Ideally, your blood pressure should be below 120/80. The first ("top") number is called the systolic pressure. It is a measure of the pressure in your arteries as your heart beats. The second ("bottom") number is called the diastolic pressure. It is a measure of the pressure in your arteries as the heart relaxes. What are the causes? The cause of this condition is not known. What increases the risk? Some risk factors for high blood pressure are under your control. Others are not. Factors you can change   Smoking.  Having type 2 diabetes mellitus, high cholesterol, or both.  Not getting enough exercise or physical activity.  Being overweight.  Having too much fat, sugar, calories, or salt (sodium) in your diet.  Drinking too much alcohol. Factors that are difficult or impossible to change   Having chronic kidney disease.  Having a family history of high blood pressure.  Age. Risk increases with age.  Race. You may be at higher risk if you are African-American.  Gender. Men are at higher risk than women before age 45. After age 65, women are at higher risk than men.  Having obstructive sleep apnea.  Stress. What are the signs or symptoms? Extremely high blood pressure (hypertensive crisis) may cause:  Headache.  Anxiety.  Shortness of breath.  Nosebleed.  Nausea and vomiting.  Severe chest pain.  Jerky movements you cannot control (seizures). How is this  diagnosed? This condition is diagnosed by measuring your blood pressure while you are seated, with your arm resting on a surface. The cuff of the blood pressure monitor will be placed directly against the skin of your upper arm at the level of your heart. It should be measured at least twice using the same arm. Certain conditions can cause a difference in blood pressure between your right and left arms. Certain factors can cause blood pressure readings to be lower or higher than normal (elevated) for a short period of time:  When your blood pressure is higher when you are in a health care provider's office than when you are at home, this is called white coat hypertension. Most people with this condition do not need medicines.  When your blood pressure is higher at home than when you are in a health care provider's office, this is called masked hypertension. Most people with this condition may need medicines to control blood pressure. If you have a high blood pressure reading during one visit or you have normal blood pressure with other risk factors:  You may be asked to return on a different day to have your blood pressure checked again.  You may be asked to monitor your blood pressure at home for 1 week or longer. If you are diagnosed with hypertension, you may have other blood or imaging tests to help your health care provider understand your overall risk for other conditions. How is this treated? This condition is treated by making healthy lifestyle changes, such as eating healthy foods, exercising more, and reducing your alcohol intake. Your health   care provider may prescribe medicine if lifestyle changes are not enough to get your blood pressure under control, and if:  Your systolic blood pressure is above 130.  Your diastolic blood pressure is above 80. Your personal target blood pressure may vary depending on your medical conditions, your age, and other factors. Follow these instructions  at home: Eating and drinking   Eat a diet that is high in fiber and potassium, and low in sodium, added sugar, and fat. An example eating plan is called the DASH (Dietary Approaches to Stop Hypertension) diet. To eat this way:  Eat plenty of fresh fruits and vegetables. Try to fill half of your plate at each meal with fruits and vegetables.  Eat whole grains, such as whole wheat pasta, brown rice, or whole grain bread. Fill about one quarter of your plate with whole grains.  Eat or drink low-fat dairy products, such as skim milk or low-fat yogurt.  Avoid fatty cuts of meat, processed or cured meats, and poultry with skin. Fill about one quarter of your plate with lean proteins, such as fish, chicken without skin, beans, eggs, and tofu.  Avoid premade and processed foods. These tend to be higher in sodium, added sugar, and fat.  Reduce your daily sodium intake. Most people with hypertension should eat less than 1,500 mg of sodium a day.  Limit alcohol intake to no more than 1 drink a day for nonpregnant women and 2 drinks a day for men. One drink equals 12 oz of beer, 5 oz of wine, or 1 oz of hard liquor. Lifestyle   Work with your health care provider to maintain a healthy body weight or to lose weight. Ask what an ideal weight is for you.  Get at least 30 minutes of exercise that causes your heart to beat faster (aerobic exercise) most days of the week. Activities may include walking, swimming, or biking.  Include exercise to strengthen your muscles (resistance exercise), such as pilates or lifting weights, as part of your weekly exercise routine. Try to do these types of exercises for 30 minutes at least 3 days a week.  Do not use any products that contain nicotine or tobacco, such as cigarettes and e-cigarettes. If you need help quitting, ask your health care provider.  Monitor your blood pressure at home as told by your health care provider.  Keep all follow-up visits as told by  your health care provider. This is important. Medicines   Take over-the-counter and prescription medicines only as told by your health care provider. Follow directions carefully. Blood pressure medicines must be taken as prescribed.  Do not skip doses of blood pressure medicine. Doing this puts you at risk for problems and can make the medicine less effective.  Ask your health care provider about side effects or reactions to medicines that you should watch for. Contact a health care provider if:  You think you are having a reaction to a medicine you are taking.  You have headaches that keep coming back (recurring).  You feel dizzy.  You have swelling in your ankles.  You have trouble with your vision. Get help right away if:  You develop a severe headache or confusion.  You have unusual weakness or numbness.  You feel faint.  You have severe pain in your chest or abdomen.  You vomit repeatedly.  You have trouble breathing. Summary  Hypertension is when the force of blood pumping through your arteries is too strong. If this condition is   not controlled, it may put you at risk for serious complications.  Your personal target blood pressure may vary depending on your medical conditions, your age, and other factors. For most people, a normal blood pressure is less than 120/80.  Hypertension is treated with lifestyle changes, medicines, or a combination of both. Lifestyle changes include weight loss, eating a healthy, low-sodium diet, exercising more, and limiting alcohol. This information is not intended to replace advice given to you by your health care provider. Make sure you discuss any questions you have with your health care provider. Document Released: 01/23/2005 Document Revised: 12/22/2015 Document Reviewed: 12/22/2015 Elsevier Interactive Patient Education  2017 [employer]     IF you received an x-ray today, you will receive an invoice from [facility].  Please contact [facility] at [phone] with questions or concerns regarding your invoice.   IF you received labwork today, you will receive an invoice from [facility]. Please contact [facility] at [phone] with questions or concerns regarding your invoice.   Our billing staff will not be able to assist you with questions regarding bills from these companies.  You will be contacted with the lab results as soon as they are available. The fastest way to get your results is to activate your My Chart account. Instructions are located on the last page of this paperwork. If you have not heard from us regarding the results in 2 weeks, please contact this office.    We recommend that you schedule a mammogram for breast cancer screening. Typically, you do not need a referral to do this. Please contact a local imaging center to schedule your mammogram.  [facility] - [phone]  *ask for the Radiology Department [facility]) - [phone] or [phone]  [facility] - [phone] [facility] - [phone]) [phone] [facility] - [phone]  *ask for the Radiology Department [facility] - [phone]  *ask for the Radiology Department [facility] - [phone]  *ask for the Mammography Department [facility] - [phone]) [phone]

## 2016-06-12 NOTE — Progress Notes (Signed)
MRN: [id number] DOB: [birth year]  Subjective:   Samantha Pearson is a 56 y.o. female presenting for chief complaint of Hand Pain (mid finger on L hand, swollen, per patient appx 10 days)  Reports 10 day history of finger pain, swelling of her left fingers. She did see red specks initially but those have resolved except for the mostly constant throbbing pain and swelling. Pain is worse with writing and using hand. Denies fever, trauma, warmth, sore throat, chest pain, n/v, abdominal pain, rashes. Has tried naproxen daily with some relief. Patient is left handed. Has a history of diffuse arthritis in her right hand.   BP - She has not taken chlorthalidone for 3 days because she thought it was causing her swelling. She is taking lisinopril 20mg .   [name] has a current medication list which includes the following prescription(s): calcium citrate-vitamin d, chlorthalidone, glucosamine-chondroitin, lisinopril, magnesium, multivitamin, naproxen sodium, and OVER THE COUNTER MEDICATION. Also is allergic to doxycycline; septra [bactrim]; and latex. [name]  has a past medical history of Allergy; Anxiety; Arthritis; Chest pain (08/03/08); Depression; Dyspnea; Edema; Heart murmur; Hypertension; and Obesity. Also  has a past surgical history that includes Cesarean section and Dilation and curettage of uterus.  Objective:   Vitals: BP (!) 175/82   Pulse 73   Temp 98.4 F (36.9 C) (Oral)   Resp 16   Ht 5' (1.524 m)   Wt 225 lb 9.6 oz (102.3 kg)   LMP 04/09/2014   SpO2 98%   BMI 44.06 kg/m   Physical Exam  Constitutional: She is oriented to person, place, and time. [name].  HENT:  Mouth/Throat: Oropharynx is clear and moist.  Eyes: No scleral icterus.  Cardiovascular: Normal rate, regular rhythm and intact distal pulses.  Exam reveals no gallop and no friction rub.   No murmur heard. Pulmonary/Chest: No respiratory distress. She has no wheezes. She has no rales.    Musculoskeletal: She exhibits no edema.       Left hand: She exhibits decreased range of motion (flexion of PIP, DIP for 2nd and 3rd left fingers, DIP of left thumb), tenderness (over DIP and PIP of 2nd, 3rd left fingers) and swelling (DIP of 1st-3rd left fingers). She exhibits no bony tenderness, normal capillary refill and no deformity. Normal sensation noted. Normal strength noted.  Neurological: She is alert and oriented to person, place, and time. No cranial nerve deficit.  Skin: Skin is warm and dry.  Psychiatric: She has a normal mood and affect.   Dg Hand Complete Left  Result Date: 06/12/2016 CLINICAL DATA:  Finger pain.  Finger swelling. EXAM: LEFT HAND - COMPLETE 3+ VIEW COMPARISON:  None FINDINGS: There are moderate degenerative changes at the basilar joint. Mild diffuse DIP joint osteoarthritis noted. Fourth and fifth PIP joint moderate osteoarthritis noted. Mild diffuse soft tissue swelling is noted involving the second and third digit. No bone erosions. No soft tissue calcifications or radiopaque foreign bodies. IMPRESSION: 1. Osteoarthritis. 2. Second and third digit soft tissue swelling Electronically Signed   By: Signa Kellaylor  Stroud M.D.   On: 06/12/2016 12:41   Assessment and Plan :   1. Finger pain, left 2. Finger swelling 3. Swelling of multiple joints 4. Decreased ROM of finger 5. Arthritis of right hand - Likely due to osteoarthritis. Will have patient start short steroid course given that naproxen has not helped patient. She is to follow up with me on 06/15/2016. - predniSONE (DELTASONE) 20 MG tablet; Take 1 tablet (20  mg total) by mouth daily with breakfast.  Dispense: 10 tablet; Refill: 0  6. Elevated blood pressure reading 7. Essential hypertension - Emphasized importance of compliance with BP medications. Restart chlorthalidone. Labs pending. Return-to-clinic precautions discussed, patient verbalized understanding. Otherwise, BP recheck as above.  Wallis Bamberg,  PA-C Primary Care at [facility] [phone] 06/12/2016  12:12 PM

## 2016-06-12 NOTE — Telephone Encounter (Signed)
.  A user error has taken place: error

## 2016-06-13 LAB — CBC
Hematocrit: 35.3 % (ref 34.0–46.6)
Hemoglobin: 11.3 g/dL (ref 11.1–15.9)
MCH: 26.8 pg (ref 26.6–33.0)
MCHC: 32 g/dL (ref 31.5–35.7)
MCV: 84 fL (ref 79–97)
PLATELETS: 300 10*3/uL (ref 150–379)
RBC: 4.21 x10E6/uL (ref 3.77–5.28)
RDW: 14.2 % (ref 12.3–15.4)
WBC: 6.9 10*3/uL (ref 3.4–10.8)

## 2016-06-13 LAB — BASIC METABOLIC PANEL
BUN / CREAT RATIO: 18 (ref 9–23)
BUN: 15 mg/dL (ref 6–24)
CHLORIDE: 102 mmol/L (ref 96–106)
CO2: 24 mmol/L (ref 18–29)
Calcium: 9.6 mg/dL (ref 8.7–10.2)
Creatinine, Ser: 0.83 mg/dL (ref 0.57–1.00)
GFR, EST AFRICAN AMERICAN: 92 mL/min/{1.73_m2} (ref >59)
GFR, EST NON AFRICAN AMERICAN: 80 mL/min/{1.73_m2} (ref >59)
Glucose: 97 mg/dL (ref 65–99)
Potassium: 4 mmol/L (ref 3.5–5.2)
Sodium: 142 mmol/L (ref 134–144)

## 2016-06-13 LAB — COMPREHENSIVE METABOLIC PANEL
ALK PHOS: 99 IU/L (ref 39–117)
ALT: 23 IU/L (ref 0–32)
AST: 26 IU/L (ref 0–40)
Albumin/Globulin Ratio: 1.3 (ref 1.2–2.2)
Albumin: 4 g/dL (ref 3.5–5.5)
BUN / CREAT RATIO: 20 (ref 9–23)
BUN: 16 mg/dL (ref 6–24)
Bilirubin Total: 0.5 mg/dL (ref 0.0–1.2)
CALCIUM: 9.6 mg/dL (ref 8.7–10.2)
CO2: 23 mmol/L (ref 18–29)
CREATININE: 0.8 mg/dL (ref 0.57–1.00)
Chloride: 102 mmol/L (ref 96–106)
GFR calc Af Amer: 96 mL/min/{1.73_m2} (ref >59)
GFR, EST NON AFRICAN AMERICAN: 83 mL/min/{1.73_m2} (ref >59)
GLOBULIN, TOTAL: 3.1 g/dL (ref 1.5–4.5)
GLUCOSE: 98 mg/dL (ref 65–99)
Potassium: 4 mmol/L (ref 3.5–5.2)
SODIUM: 140 mmol/L (ref 134–144)
Total Protein: 7.1 g/dL (ref 6.0–8.5)

## 2016-06-13 LAB — SEDIMENTATION RATE: Sed Rate: 25 mm/hr (ref 0–40)

## 2016-06-13 LAB — RHEUMATOID ARTHRITIS PROFILE
Cyclic Citrullin Peptide Ab: 6 units (ref 0–19)
Rhuematoid fact SerPl-aCnc: 10.6 IU/mL (ref 0.0–13.9)

## 2016-06-13 LAB — BRAIN NATRIURETIC PEPTIDE: BNP: 15.7 pg/mL (ref 0.0–100.0)

## 2016-06-13 LAB — URIC ACID: URIC ACID: 8.3 mg/dL — AB (ref 2.5–7.1)

## 2016-06-13 LAB — ANA W/REFLEX IF POSITIVE: ANA: NEGATIVE

## 2016-06-16 ENCOUNTER — Encounter: Payer: Self-pay | Admitting: Physician Assistant

## 2016-06-16 ENCOUNTER — Ambulatory Visit (INDEPENDENT_AMBULATORY_CARE_PROVIDER_SITE_OTHER): Payer: BLUE CROSS/BLUE SHIELD | Admitting: Physician Assistant

## 2016-06-16 VITALS — BP 177/85 | HR 85 | Temp 98.3°F | Resp 18 | Ht 60.0 in | Wt 223.2 lb

## 2016-06-16 DIAGNOSIS — M79645 Pain in left finger(s): Secondary | ICD-10-CM | POA: Diagnosis not present

## 2016-06-16 DIAGNOSIS — M7989 Other specified soft tissue disorders: Secondary | ICD-10-CM | POA: Diagnosis not present

## 2016-06-16 DIAGNOSIS — I10 Essential (primary) hypertension: Secondary | ICD-10-CM

## 2016-06-16 DIAGNOSIS — F439 Reaction to severe stress, unspecified: Secondary | ICD-10-CM | POA: Diagnosis not present

## 2016-06-16 MED ORDER — AMLODIPINE BESYLATE 10 MG PO TABS: 10.0000 mg | ORAL_TABLET | Freq: Every day | ORAL | 0 refills | Status: DC

## 2016-06-16 MED ORDER — PREDNISONE 20 MG PO TABS: ORAL_TABLET | ORAL | 0 refills | Status: DC

## 2016-06-16 NOTE — Progress Notes (Signed)
Samantha RanchJanice M Pearson  MRN: [id number] DOB: 09-16-1960  PCP: Morrell RiddleWeber, Lynore Coscia L, PA-C  Chief Complaint  Patient presents with  . Hypertension    Subjective:  Pt presents to clinic for HTN recheck.  She has been taking both her medications for the last week - she had stopped the clorthalidone when her finger started to cause problems.  She has been taking prednisone 20mg  a day and her finger is only a little better.  She got the lab results back that her uric acid was high - she has never had gout before.  She write at work all the time and knows she has arthritis but this feels different.  She has been under a lot of stress.  Her dad was recently diagnosed with prostate cancer and she tried to move in with him to help him but he was mean to her and treated her like a child.  About a month ago she moved in with her mother to help because she has recently been diagnosed with breast cancer.  This situation has been very stressful for her though it is getting better because her moms treatment is almost done and she is doing good.  She is also more relaxed living with her mom.    Review of Systems  Constitutional: Negative for chills and fever.  Respiratory: Negative for shortness of breath.   Cardiovascular: Positive for chest pain (all anxiety /stress related - less since moving in with her mom). Negative for palpitations and leg swelling.  Neurological: Negative for headaches.    Patient Active Problem List   Diagnosis Date Noted  . Left knee pain 03/14/2016  . Class 3 obesity due to excess calories with serious comorbidity and body mass index (BMI) of 40.0 to 44.9 in adult (HCC) 08/18/2013  . Stress 05/22/2011  . ESSENTIAL HYPERTENSION, BENIGN 08/07/2008    Current Outpatient Prescriptions on File Prior to Visit  Medication Sig Dispense Refill  . Calcium Carbonate-Vitamin D (CALCIUM + D PO) Take by mouth daily.    . chlorthalidone (HYGROTON) 25 MG tablet Take 1 tablet (25 mg total) by mouth  daily. 90 tablet 0  . Glucosamine-Chondroitin (GLUCOSAMINE CHONDR COMPLEX PO) Take by mouth daily.    Marland Kitchen. lisinopril (PRINIVIL,ZESTRIL) 20 MG tablet Take 1 tablet (20 mg total) by mouth daily. 90 tablet 0  . magnesium 30 MG tablet Take 30 mg by mouth 2 (two) times daily.    . Multiple Vitamin (MULTIVITAMIN) tablet Take 1 tablet by mouth daily.    . naproxen sodium (ANAPROX DS) 550 MG tablet Take 1 tablet (550 mg total) by mouth 2 (two) times daily with a meal. 180 tablet 1  . OVER THE COUNTER MEDICATION OTC Vitamin  B12 500 mg taking daily     No current facility-administered medications on file prior to visit.     Allergies  Allergen Reactions  . Doxycycline Hives, Itching and Swelling  . Septra [Bactrim] Hives, Itching and Swelling  . Latex Swelling and Rash    Pt patients past, family and social history were reviewed and updated.   Objective:  BP (!) 177/85   Pulse 85   Temp 98.3 F (36.8 C) (Oral)   Resp 18   Ht 5' (1.524 m)   Wt 223 lb 3.2 oz (101.2 kg)   LMP 04/09/2014   SpO2 97%   BMI 43.59 kg/m   Physical Exam  Constitutional: She is oriented to person, place, and time and well-developed, well-nourished, and in no  distress.  HENT:  Head: Normocephalic and atraumatic.  Right Ear: Hearing and external ear normal.  Left Ear: Hearing and external ear normal.  Eyes: Conjunctivae are normal.  Neck: Normal range of motion.  Cardiovascular: Normal rate, regular rhythm and normal heart sounds.   No murmur heard. Pulmonary/Chest: Effort normal and breath sounds normal.  Musculoskeletal:       Hands:      Right lower leg: She exhibits no edema.       Left lower leg: She exhibits no edema.  Neurological: She is alert and oriented to person, place, and time. Gait normal.  Skin: Skin is warm and dry.  Psychiatric: Mood, memory, affect and judgment normal.  Vitals reviewed.   Assessment and Plan :   Problem List Items Addressed This Visit      Cardiovascular and  Mediastinum   ESSENTIAL HYPERTENSION, BENIGN    Stop chlorthalidone due to elevated uric acid and possible current gout flair.  Continue Lisinopril and add Norvasc - monitor BP at home - recheck with me in a month.      Relevant Medications   amLODipine (NORVASC) 10 MG tablet     Other   Stress    Pt currently feels like this is improving - she does not believe she needs medication for it currently.       Other Visit Diagnoses    Essential hypertension    -  Primary   Relevant Medications   amLODipine (NORVASC) 10 MG tablet   Other Relevant Orders   Care order/instruction:   Finger pain, left    - possible gout - increase dose of prednisone of the next 5 ays to see if that will stop this flair   Relevant Medications   predniSONE (DELTASONE) 20 MG tablet   Finger swelling       Relevant Medications   predniSONE (DELTASONE) 20 MG tablet      Benny Lennert PA-C  Primary Care at [facility] 06/16/2016 11:33 AM

## 2016-06-16 NOTE — Assessment & Plan Note (Signed)
Pt currently feels like this is improving - she does not believe she needs medication for it currently.

## 2016-06-16 NOTE — Patient Instructions (Addendum)
Stop chlorthalidone -  Start Norvasc - to help with your BP -  I will see you in a month  For the prednisone for the presumed gout -- Day 1 - take 3 pills Day 2- take 2 pills Day 3 - take 2 pills Day 4- Take 2 pills Day 5 - Take 1 pill Day 6 - take 1 pill Day 7 - take 1 pill   IF you received an x-ray today, you will receive an invoice from [facility]. Please contact [facility] at [phone] with questions or concerns regarding your invoice.   IF you received labwork today, you will receive an invoice from [facility]. Please contact [facility] at [phone] with questions or concerns regarding your invoice.   Our billing staff will not be able to assist you with questions regarding bills from these companies.  You will be contacted with the lab results as soon as they are available. The fastest way to get your results is to activate your My Chart account. Instructions are located on the last page of this paperwork. If you have not heard from us regarding the results in 2 weeks, please contact this office.

## 2016-06-16 NOTE — Assessment & Plan Note (Signed)
Stop chlorthalidone due to elevated uric acid and possible current gout flair.  Continue Lisinopril and add Norvasc - monitor BP at home - recheck with me in a month.

## 2016-06-22 ENCOUNTER — Ambulatory Visit (INDEPENDENT_AMBULATORY_CARE_PROVIDER_SITE_OTHER): Payer: BLUE CROSS/BLUE SHIELD | Admitting: Physician Assistant

## 2016-06-22 ENCOUNTER — Encounter: Payer: Self-pay | Admitting: Physician Assistant

## 2016-06-22 VITALS — BP 189/81 | HR 80 | Temp 99.2°F | Resp 16 | Ht 60.0 in | Wt 222.0 lb

## 2016-06-22 DIAGNOSIS — I10 Essential (primary) hypertension: Secondary | ICD-10-CM

## 2016-06-22 DIAGNOSIS — L03012 Cellulitis of left finger: Secondary | ICD-10-CM | POA: Diagnosis not present

## 2016-06-22 MED ORDER — CLINDAMYCIN HCL 300 MG PO CAPS: 300.0000 mg | ORAL_CAPSULE | Freq: Three times a day (TID) | ORAL | 0 refills | Status: DC

## 2016-06-22 NOTE — Patient Instructions (Addendum)
Warm compresses Take all antibiotics    IF you received an x-ray today, you will receive an invoice from [facility]. Please contact [facility] at [phone] with questions or concerns regarding your invoice.   IF you received labwork today, you will receive an invoice from [facility]. Please contact [facility] at [phone] with questions or concerns regarding your invoice.   Our billing staff will not be able to assist you with questions regarding bills from these companies.  You will be contacted with the lab results as soon as they are available. The fastest way to get your results is to activate your My Chart account. Instructions are located on the last page of this paperwork. If you have not heard from us regarding the results in 2 weeks, please contact this office.

## 2016-06-26 ENCOUNTER — Encounter: Payer: Self-pay | Admitting: Physician Assistant

## 2016-06-26 ENCOUNTER — Ambulatory Visit (INDEPENDENT_AMBULATORY_CARE_PROVIDER_SITE_OTHER): Payer: BLUE CROSS/BLUE SHIELD | Admitting: Physician Assistant

## 2016-06-26 VITALS — BP 162/88 | HR 76 | Temp 98.7°F | Resp 16 | Wt 225.8 lb

## 2016-06-26 DIAGNOSIS — L03012 Cellulitis of left finger: Secondary | ICD-10-CM | POA: Diagnosis not present

## 2016-06-26 DIAGNOSIS — I10 Essential (primary) hypertension: Secondary | ICD-10-CM

## 2016-06-26 NOTE — Progress Notes (Signed)
Samantha Pearson  MRN: [id number] DOB: [birth year]  PCP: Morrell Riddle, PA-C  Chief Complaint  Patient presents with  . Edema    left second finger x 3 weeks    Subjective:  Pt presents to clinic for recheck her her finger.  She has finished the prednisone that she was given for a presumed gout but the swelling has gotten worse as has the pain - it also seems more red to the patient.  She has been using her BP medications since the change at her last appt last week and she currently has no symptoms.  Review of Systems  Constitutional: Negative for chills and fever.  Respiratory: Negative for shortness of breath.   Cardiovascular: Negative for chest pain, palpitations and leg swelling.  Neurological: Negative for headaches.    Patient Active Problem List   Diagnosis Date Noted  . Left knee pain 03/14/2016  . Class 3 obesity due to excess calories with serious comorbidity and body mass index (BMI) of 40.0 to 44.9 in adult (HCC) 08/18/2013  . Stress 05/22/2011  . ESSENTIAL HYPERTENSION, BENIGN 08/07/2008    Current Outpatient Prescriptions on File Prior to Visit  Medication Sig Dispense Refill  . amLODipine (NORVASC) 10 MG tablet Take 1 tablet (10 mg total) by mouth daily. 30 tablet 0  . Calcium Carbonate-Vitamin D (CALCIUM + D PO) Take by mouth daily.    . Glucosamine-Chondroitin (GLUCOSAMINE CHONDR COMPLEX PO) Take by mouth daily.    Marland Kitchen lisinopril (PRINIVIL,ZESTRIL) 20 MG tablet Take 1 tablet (20 mg total) by mouth daily. 90 tablet 0  . magnesium 30 MG tablet Take 30 mg by mouth 2 (two) times daily.    . Multiple Vitamin (MULTIVITAMIN) tablet Take 1 tablet by mouth daily.    . naproxen sodium (ANAPROX DS) 550 MG tablet Take 1 tablet (550 mg total) by mouth 2 (two) times daily with a meal. 180 tablet 1  . OVER THE COUNTER MEDICATION OTC Vitamin  B12 500 mg taking daily    . chlorthalidone (HYGROTON) 25 MG tablet Take 1 tablet (25 mg total) by mouth daily. (Patient not taking:  Reported on 06/22/2016) 90 tablet 0  . predniSONE (DELTASONE) 20 MG tablet Day 1 - 60mg  - Day 2-4 40mg  Day 5-7 20mg  - take in the am with food (Patient not taking: Reported on 06/22/2016) 8 tablet 0   No current facility-administered medications on file prior to visit.     Allergies  Allergen Reactions  . Doxycycline Hives, Itching and Swelling  . Septra [Bactrim] Hives, Itching and Swelling  . Latex Swelling and Rash    Pt patients past, family and social history were reviewed and updated.   Objective:  BP (!) 189/81 (BP Location: Right Arm, Patient Position: Sitting, Cuff Size: Large)   Pulse 80   Temp 99.2 F (37.3 C) (Oral)   Resp 16   Ht 5' (1.524 m)   Wt 222 lb (100.7 kg)   LMP 04/09/2014   SpO2 98%   BMI 43.36 kg/m   Physical Exam  Constitutional: She is oriented to person, place, and time and well-developed, well-nourished, and in no distress.  HENT:  Head: Normocephalic and atraumatic.  Right Ear: Hearing and external ear normal.  Left Ear: Hearing and external ear normal.  Eyes: Conjunctivae are normal.  Neck: Normal range of motion.  Pulmonary/Chest: Effort normal.  Musculoskeletal:  Left middle distal phalanx - erythematous, swollen and TTP - warm to the touch  Neurological: She is  alert and oriented to person, place, and time. Gait normal.  Skin: Skin is warm and dry.  Psychiatric: Mood, memory, affect and judgment normal.  Vitals reviewed.     Assessment and Plan :  Cellulitis of finger of left hand - Plan: clindamycin (CLEOCIN) 300 MG capsule, Care order/instruction: - start abx - warm soaps - recheck in 72h  Essential hypertension, benign - continue medications - a change in medication was made at her last OV 5/11.  Benny LennertSarah Weber PA-C  Primary Care at [facility] 06/26/2016 11:24 AM

## 2016-06-26 NOTE — Patient Instructions (Signed)
     IF you received an x-ray today, you will receive an invoice from [facility]. Please contact [facility] at [phone] with questions or concerns regarding your invoice.   IF you received labwork today, you will receive an invoice from [facility]. Please contact [facility] at [phone] with questions or concerns regarding your invoice.   Our billing staff will not be able to assist you with questions regarding bills from these companies.  You will be contacted with the lab results as soon as they are available. The fastest way to get your results is to activate your My Chart account. Instructions are located on the last page of this paperwork. If you have not heard from us regarding the results in 2 weeks, please contact this office.     

## 2016-06-26 NOTE — Progress Notes (Signed)
Samantha Pearson  MRN: [id number] DOB: 10/07/1960  PCP: Morrell Riddle, PA-C  Chief Complaint  Patient presents with  . Follow-up    Subjective:  Pt presents to clinic for f/u of her finger pain and swelling - she has been using the Clindamycin and the finger erythema and swelling and pain is much better.  She has been using a probiotic and has not been having diarrhea.  Her BP this am was better with the top number 155/ bottom number unknown  Review of Systems  Constitutional: Negative for chills and fever.  Respiratory: Negative for shortness of breath.   Cardiovascular: Negative for chest pain, palpitations and leg swelling.  Neurological: Negative for headaches.    Patient Active Problem List   Diagnosis Date Noted  . Left knee pain 03/14/2016  . Class 3 obesity due to excess calories with serious comorbidity and body mass index (BMI) of 40.0 to 44.9 in adult (HCC) 08/18/2013  . Stress 05/22/2011  . ESSENTIAL HYPERTENSION, BENIGN 08/07/2008    Current Outpatient Prescriptions on File Prior to Visit  Medication Sig Dispense Refill  . amLODipine (NORVASC) 10 MG tablet Take 1 tablet (10 mg total) by mouth daily. 30 tablet 0  . Calcium Carbonate-Vitamin D (CALCIUM + D PO) Take by mouth daily.    . clindamycin (CLEOCIN) 300 MG capsule Take 1 capsule (300 mg total) by mouth 3 (three) times daily. 30 capsule 0  . Glucosamine-Chondroitin (GLUCOSAMINE CHONDR COMPLEX PO) Take by mouth daily.    Marland Kitchen lisinopril (PRINIVIL,ZESTRIL) 20 MG tablet Take 1 tablet (20 mg total) by mouth daily. 90 tablet 0  . magnesium 30 MG tablet Take 30 mg by mouth 2 (two) times daily.    . Multiple Vitamin (MULTIVITAMIN) tablet Take 1 tablet by mouth daily.    . naproxen sodium (ANAPROX DS) 550 MG tablet Take 1 tablet (550 mg total) by mouth 2 (two) times daily with a meal. 180 tablet 1  . OVER THE COUNTER MEDICATION OTC Vitamin  B12 500 mg taking daily     No current facility-administered medications on  file prior to visit.     Allergies  Allergen Reactions  . Doxycycline Hives, Itching and Swelling  . Septra [Bactrim] Hives, Itching and Swelling  . Latex Swelling and Rash    Pt patients past, family and social history were reviewed and updated.   Objective:  BP (!) 162/88 (BP Location: Right Arm, Patient Position: Sitting, Cuff Size: Large)   Pulse 76   Temp 98.7 F (37.1 C) (Oral)   Resp 16   Wt 225 lb 12.8 oz (102.4 kg)   LMP 04/09/2014   SpO2 98%   BMI 44.10 kg/m   Physical Exam  Constitutional: She is oriented to person, place, and time and well-developed, well-nourished, and in no distress.  HENT:  Head: Normocephalic and atraumatic.  Right Ear: Hearing and external ear normal.  Left Ear: Hearing and external ear normal.  Eyes: Conjunctivae are normal.  Neck: Normal range of motion.  Pulmonary/Chest: Effort normal.  Neurological: She is alert and oriented to person, place, and time. Gait normal.  Skin: Skin is warm and dry.  Left middle distal phalanx - decrease swelling, minimal TTP - less erythema - improved  Psychiatric: Mood, memory, affect and judgment normal.  Vitals reviewed.   Assessment and Plan :  Essential hypertension, benign - continue to monitor - she has an appt already scheduled  Cellulitis of finger of left hand - continue and  finish abx -   Benny LennertSarah Zenia Guest PA-C  [facility] Care at [facility] 06/26/2016 3:09 PM

## 2016-06-27 ENCOUNTER — Telehealth: Payer: Self-pay | Admitting: Family Medicine

## 2016-06-27 NOTE — Telephone Encounter (Signed)
Pharmacy stating that the prednisone should have 12 tab instead of 8 please respond

## 2016-06-27 NOTE — Telephone Encounter (Signed)
Called and spoke with the pharmacy - pt does not need the prednisone at this time.

## 2016-07-01 ENCOUNTER — Other Ambulatory Visit: Payer: Self-pay | Admitting: Physician Assistant

## 2016-07-01 DIAGNOSIS — I10 Essential (primary) hypertension: Secondary | ICD-10-CM

## 2016-07-04 ENCOUNTER — Telehealth: Payer: Self-pay | Admitting: Physician Assistant

## 2016-07-04 DIAGNOSIS — I10 Essential (primary) hypertension: Secondary | ICD-10-CM

## 2016-07-04 MED ORDER — LISINOPRIL 20 MG PO TABS: 20.0000 mg | ORAL_TABLET | Freq: Every day | ORAL | 0 refills | Status: DC

## 2016-07-04 NOTE — Telephone Encounter (Signed)
Pt is needing a refill on lisinopril  Best number [phone]

## 2016-07-06 ENCOUNTER — Other Ambulatory Visit: Payer: Self-pay | Admitting: Physician Assistant

## 2016-07-06 DIAGNOSIS — I10 Essential (primary) hypertension: Secondary | ICD-10-CM

## 2016-07-18 ENCOUNTER — Ambulatory Visit: Payer: BLUE CROSS/BLUE SHIELD | Admitting: Physician Assistant

## 2016-07-21 ENCOUNTER — Encounter: Payer: Self-pay | Admitting: Physician Assistant

## 2016-07-21 ENCOUNTER — Ambulatory Visit (INDEPENDENT_AMBULATORY_CARE_PROVIDER_SITE_OTHER): Payer: BLUE CROSS/BLUE SHIELD | Admitting: Physician Assistant

## 2016-07-21 VITALS — BP 158/80 | HR 76 | Temp 99.0°F | Resp 18 | Ht 59.06 in | Wt 227.0 lb

## 2016-07-21 DIAGNOSIS — I10 Essential (primary) hypertension: Secondary | ICD-10-CM

## 2016-07-21 DIAGNOSIS — M19042 Primary osteoarthritis, left hand: Secondary | ICD-10-CM | POA: Diagnosis not present

## 2016-07-21 MED ORDER — CHLORTHALIDONE 50 MG PO TABS: 50.0000 mg | ORAL_TABLET | Freq: Every day | ORAL | 0 refills | Status: DC

## 2016-07-21 MED ORDER — MELOXICAM 7.5 MG PO TABS: 7.5000 mg | ORAL_TABLET | Freq: Every day | ORAL | 0 refills | Status: DC

## 2016-07-21 NOTE — Patient Instructions (Addendum)
  For your swelling  [facility] Address: [address], [place], [place] [address]  Phone: [phone]  [place] or [facility]   Medium compression 25-35 mgHG   For your blood pressure  Take your chlorthalidone at the beginning of your day Take your lisinopril at the end of your day  In a week -- call me with how you are tolerating the chlorthalidone and what your BP readings are  For therapy -- [facility] ([address], Ernest McCoy, Heather Kitchens, [place]) - [phone] [facility]([place]) - [phone] [facility] - [phone] [facility] - [phone] - [phone] [facility]  - [phone] (do not file insurance)       IF you received an x-ray today, you will receive an invoice from [facility]. Please contact [facility] at [phone] with questions or concerns regarding your invoice.   IF you received labwork today, you will receive an invoice from [facility]. Please contact [facility] at [phone] with questions or concerns regarding your invoice.   Our billing staff will not be able to assist you with questions regarding bills from these companies.  You will be contacted with the lab results as soon as they are available. The fastest way to get your results is to activate your My Chart account. Instructions are located on the last page of this paperwork. If you have not heard from us regarding the results in 2 weeks, please contact this office.

## 2016-07-21 NOTE — Progress Notes (Signed)
Samantha Pearson  MRN: [id number] DOB: [birth year]  PCP: Morrell RiddleWeber, Sarah L, PA-C  Chief Complaint  Patient presents with  . Hypertension    follow up pt states she has some concernes about the mediacation     Subjective:  Pt presents to clinic for concerns regarding her BP medications.  Ever since she has been on the Norvasc she has noticed that her legs and hands are swelling.  She is having no SOB or cough or CP - the swelling is mostly gone in the am.  She does not think that she has changed her amount of salt intake.  She did stop the norvasc and the swelling resolved and then her BP elevated so she restarted it and the swelling returned.    She is continuing to have pain in there left middle finger - the erythema has resolved - the skin is peeling but the pain has changed back to the deep aching pain.  She will take naproxen at time and that helps but she waits until her pain is really bad before she uses it because she does not like to take pills.  Review of Systems  Constitutional: Negative for chills and fever.  Respiratory: Negative for shortness of breath.   Cardiovascular: Negative for chest pain, palpitations and leg swelling.  Neurological: Negative for headaches.    Patient Active Problem List   Diagnosis Date Noted  . Left knee pain 03/14/2016  . Class 3 obesity due to excess calories with serious comorbidity and body mass index (BMI) of 40.0 to 44.9 in adult (HCC) 08/18/2013  . Stress 05/22/2011  . ESSENTIAL HYPERTENSION, BENIGN 08/07/2008    Current Outpatient Prescriptions on File Prior to Visit  Medication Sig Dispense Refill  . Glucosamine-Chondroitin (GLUCOSAMINE CHONDR COMPLEX PO) Take by mouth daily.    Marland Kitchen. lisinopril (PRINIVIL,ZESTRIL) 20 MG tablet Take 1 tablet (20 mg total) by mouth daily. 90 tablet 0  . Multiple Vitamin (MULTIVITAMIN) tablet Take 1 tablet by mouth daily.    . naproxen sodium (ANAPROX DS) 550 MG tablet Take 1 tablet (550 mg total) by mouth 2  (two) times daily with a meal. 180 tablet 1  . OVER THE COUNTER MEDICATION OTC Vitamin  B50 500 mg taking daily     No current facility-administered medications on file prior to visit.     Allergies  Allergen Reactions  . Doxycycline Hives, Itching and Swelling  . Norvasc [Amlodipine Besylate] Swelling    Feet swelling  . Septra [Bactrim] Hives, Itching and Swelling  . Latex Swelling and Rash    Pt patients past, family and social history were reviewed and updated.   Objective:  BP (!) 158/80   Pulse 76   Temp 99 F (37.2 C) (Oral)   Resp 18   Ht 4' 11.06" (1.5 m)   Wt 227 lb (103 kg)   LMP 04/09/2014   SpO2 98%   BMI 45.76 kg/m   Physical Exam  Constitutional: She is oriented to person, place, and time and well-developed, well-nourished, and in no distress.  HENT:  Head: Normocephalic and atraumatic.  Right Ear: Hearing and external ear normal.  Left Ear: Hearing and external ear normal.  Eyes: Conjunctivae are normal.  Neck: Normal range of motion.  Cardiovascular: Normal rate, regular rhythm and normal heart sounds.   No murmur heard. Pulmonary/Chest: Effort normal and breath sounds normal.  Musculoskeletal:       Right lower leg: She exhibits edema.  Left lower leg: She exhibits edema.  Bilateral pitting edema about 2/3 to proximal shin.  Left middle distal phalanx swollen with peeling skin - no erythema.  Neurological: She is alert and oriented to person, place, and time. Gait normal.  Skin: Skin is warm and dry.  Psychiatric: Mood, memory, affect and judgment normal.  Vitals reviewed.   Assessment and Plan :  Essential hypertension - Plan: chlorthalidone (HYGROTON) 50 MG tablet, Care order/instruction: - stop Norvasc - restart chlorthalidone which should help with the swelling and give her all day BP coverage - she will monitor her BP at home and recheck with me in month or sooner if her BP is high or her swelling does not resolve. She was encouraged to  decrease salt in her diet and use compression socks and elevation.  Osteoarthritis of finger of left hand - Plan: meloxicam (MOBIC) 7.5 MG tablet - trial of mobic - pt instructed to not use naproxen with this -her xrays were reviewed and her level of arthritis was discussed with the patient - she might get some relief with an injection but at this time she was not interested in an ortho referral.  Benny Lennert PA-C  Primary Care at [facility] 07/24/2016 11:52 AM

## 2016-07-28 ENCOUNTER — Telehealth: Payer: Self-pay | Admitting: Physician Assistant

## 2016-07-28 NOTE — Telephone Encounter (Signed)
FYI

## 2016-07-28 NOTE — Telephone Encounter (Signed)
Pt is needing to let Maralyn SagoSarah know that with the new blood pressure medication and the one she was currently taking the swelling in ankles has reduced and that the blood pressure reading have been 166/76 165/73 180/86 and 166/73   Best number is [phone]

## 2016-07-31 NOTE — Telephone Encounter (Signed)
Left message to return message 

## 2016-07-31 NOTE — Telephone Encounter (Signed)
I want her to take 2 of her lisinopril to see if we can get better control.  She should be taking the fluid pill in the am and the other 2 pills of lisinopril at night.

## 2016-08-10 DIAGNOSIS — Z01419 Encounter for gynecological examination (general) (routine) without abnormal findings: Secondary | ICD-10-CM | POA: Diagnosis not present

## 2016-08-10 DIAGNOSIS — Z6841 Body Mass Index (BMI) 40.0 and over, adult: Secondary | ICD-10-CM | POA: Diagnosis not present

## 2016-08-19 ENCOUNTER — Other Ambulatory Visit: Payer: Self-pay | Admitting: Physician Assistant

## 2016-08-19 DIAGNOSIS — I10 Essential (primary) hypertension: Secondary | ICD-10-CM

## 2016-08-22 ENCOUNTER — Ambulatory Visit (INDEPENDENT_AMBULATORY_CARE_PROVIDER_SITE_OTHER): Payer: BLUE CROSS/BLUE SHIELD | Admitting: Physician Assistant

## 2016-08-22 ENCOUNTER — Encounter: Payer: Self-pay | Admitting: Physician Assistant

## 2016-08-22 ENCOUNTER — Ambulatory Visit: Payer: BLUE CROSS/BLUE SHIELD | Admitting: Physician Assistant

## 2016-08-22 DIAGNOSIS — M19042 Primary osteoarthritis, left hand: Secondary | ICD-10-CM

## 2016-08-22 DIAGNOSIS — I10 Essential (primary) hypertension: Secondary | ICD-10-CM | POA: Diagnosis not present

## 2016-08-22 MED ORDER — CHLORTHALIDONE 50 MG PO TABS: 50.0000 mg | ORAL_TABLET | Freq: Every day | ORAL | 0 refills | Status: DC

## 2016-08-22 MED ORDER — LISINOPRIL 40 MG PO TABS: 40.0000 mg | ORAL_TABLET | Freq: Every day | ORAL | 2 refills | Status: DC

## 2016-08-22 MED ORDER — MELOXICAM 7.5 MG PO TABS: 7.5000 mg | ORAL_TABLET | Freq: Every day | ORAL | 0 refills | Status: DC

## 2016-08-22 NOTE — Patient Instructions (Signed)
     IF you received an x-ray today, you will receive an invoice from [facility]. Please contact [facility] at [phone] with questions or concerns regarding your invoice.   IF you received labwork today, you will receive an invoice from [facility]. Please contact [facility] at [phone] with questions or concerns regarding your invoice.   Our billing staff will not be able to assist you with questions regarding bills from these companies.  You will be contacted with the lab results as soon as they are available. The fastest way to get your results is to activate your My Chart account. Instructions are located on the last page of this paperwork. If you have not heard from us regarding the results in 2 weeks, please contact this office.     

## 2016-08-22 NOTE — Progress Notes (Signed)
Samantha Pearson  MRN: [id number] DOB: 07/28/1960  PCP: Morrell Riddle, PA-C  Chief Complaint  Patient presents with  . Hypertension    follow up     Subjective:  Pt presents to clinic for recheck of her HTN - she has seen at her GYN and at that visit her BP was really good - she has been rushing around today and has to get to her 2nd job after this appt so she is concerned that might be why her BP is slightly elevated today.  She is tolerating the medications ok.  She has restarted the chlorthalidone and her BP readings has been better since then.  The mobic is really helping her finger arthritis and with a pill a day she rarely takes the evening Naproxen after work.  She is also on am shift which is better for her BP.    History is obtained by patient.  Review of Systems  Constitutional: Negative for chills and fever.  Respiratory: Negative for shortness of breath.   Cardiovascular: Negative for chest pain, palpitations and leg swelling.  Musculoskeletal: Positive for arthralgias (fingers - better with medications).  Neurological: Negative for headaches.    Patient Active Problem List   Diagnosis Date Noted  . Left knee pain 03/14/2016  . Class 3 obesity due to excess calories with serious comorbidity and body mass index (BMI) of 40.0 to 44.9 in adult (HCC) 08/18/2013  . Stress 05/22/2011  . ESSENTIAL HYPERTENSION, BENIGN 08/07/2008    Current Outpatient Prescriptions on File Prior to Visit  Medication Sig Dispense Refill  . Glucosamine-Chondroitin (GLUCOSAMINE CHONDR COMPLEX PO) Take by mouth daily.    . Multiple Vitamin (MULTIVITAMIN) tablet Take 1 tablet by mouth daily.    . naproxen sodium (ANAPROX DS) 550 MG tablet Take 1 tablet (550 mg total) by mouth 2 (two) times daily with a meal. 180 tablet 1  . OVER THE COUNTER MEDICATION OTC Vitamin  B50 500 mg taking daily     No current facility-administered medications on file prior to visit.     Allergies  Allergen  Reactions  . Doxycycline Hives, Itching and Swelling  . Norvasc [Amlodipine Besylate] Swelling    Feet swelling  . Prednisone Swelling  . Septra [Bactrim] Hives, Itching and Swelling  . Latex Swelling and Rash    Past Medical History:  Diagnosis Date  . Allergy   . Anxiety   . Arthritis   . Chest pain 08/03/08   Urgent care visit  . Depression   . Dyspnea   . Edema   . Heart murmur   . Hypertension   . 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 Smoking status: Never Smoker  . Smokeless tobacco: Never Used     Comment: Nonsmoker  . Alcohol use No   family history includes Cancer (age of onset: 72) in her mother; Colon cancer in her maternal uncle; Colon polyps in her  mother; Heart failure in her father; Hypertension in her brother, sister, sister, sister, and sister.     Objective:  BP 140/70   Pulse 92   Temp 98.6 F (37 C) (Oral)   Resp 18   Ht 4' 11.06" (1.5 m)   Wt 223 lb 9.6 oz (101.4 kg)   LMP 04/09/2014   SpO2 96%   BMI 45.07 kg/m  Body mass index is 45.07 kg/m.  Physical Exam  Constitutional: She is oriented to person, place, and time and well-developed, well-nourished, and in no distress.  HENT:  Head: Normocephalic and atraumatic.  Right Ear: Hearing and external ear normal.  Left Ear: Hearing and external ear normal.  Eyes: Conjunctivae are normal.  Neck: Normal range of motion.  Cardiovascular: Normal rate, regular rhythm and normal heart sounds.   No murmur heard. Pulmonary/Chest: Effort normal and breath sounds normal.  Musculoskeletal:       Right lower leg: She  exhibits no edema.       Left lower leg: She exhibits no edema.  Bilateral hands - but the worse is the left middle finger - DIP with herberden nodes.  Neurological: She is alert and oriented to person, place, and time. Gait normal.  Skin: Skin is warm and dry.  Psychiatric: Mood, memory, affect and judgment normal.  Vitals reviewed.   Assessment and Plan :  Essential hypertension - Plan: lisinopril (PRINIVIL,ZESTRIL) 40 MG tablet, chlorthalidone (HYGROTON) 50 MG tablet - systolic slightly high today but patient has had a stressful day   Osteoarthritis of finger of left hand - Plan: meloxicam (MOBIC) 7.5 MG tablet   Medications refilled - ok to continue the mobic - she will continue to monitor her BP at home and we discussed the correct way to take her BP to prevent false elevated readings.  She will recheck with me in 3 months unless she notices that her readings at home are increasing.  Benny LennertSarah Kaleth Koy PA-C  [facility] at [facility] 08/22/2016 5:57 PM

## 2016-11-27 LAB — LIPID PANEL
CHOLESTEROL: 226 — AB (ref 0–200)
HDL: 54 (ref 35–70)
LDL/HDL RATIO: 4.2

## 2016-11-27 LAB — BASIC METABOLIC PANEL: Glucose: 119

## 2016-11-28 ENCOUNTER — Ambulatory Visit: Payer: BLUE CROSS/BLUE SHIELD | Admitting: Physician Assistant

## 2016-11-28 ENCOUNTER — Ambulatory Visit (INDEPENDENT_AMBULATORY_CARE_PROVIDER_SITE_OTHER): Payer: BLUE CROSS/BLUE SHIELD | Admitting: Physician Assistant

## 2016-11-28 ENCOUNTER — Encounter: Payer: Self-pay | Admitting: Physician Assistant

## 2016-11-28 VITALS — BP 136/70 | HR 98 | Temp 99.5°F | Resp 18 | Ht 59.0 in | Wt 218.4 lb

## 2016-11-28 DIAGNOSIS — R7309 Other abnormal glucose: Secondary | ICD-10-CM

## 2016-11-28 DIAGNOSIS — I10 Essential (primary) hypertension: Secondary | ICD-10-CM

## 2016-11-28 DIAGNOSIS — Z0000 Encounter for general adult medical examination without abnormal findings: Secondary | ICD-10-CM

## 2016-11-28 DIAGNOSIS — Z1231 Encounter for screening mammogram for malignant neoplasm of breast: Secondary | ICD-10-CM | POA: Diagnosis not present

## 2016-11-28 DIAGNOSIS — Z130 Encounter for screening for diseases of the blood and blood-forming organs and certain disorders involving the immune mechanism: Secondary | ICD-10-CM | POA: Diagnosis not present

## 2016-11-28 DIAGNOSIS — E876 Hypokalemia: Secondary | ICD-10-CM | POA: Diagnosis not present

## 2016-11-28 DIAGNOSIS — Z6841 Body Mass Index (BMI) 40.0 and over, adult: Secondary | ICD-10-CM | POA: Diagnosis not present

## 2016-11-28 MED ORDER — CHLORTHALIDONE 50 MG PO TABS: 50.0000 mg | ORAL_TABLET | Freq: Every day | ORAL | 1 refills | Status: DC

## 2016-11-28 MED ORDER — LISINOPRIL 40 MG PO TABS: 40.0000 mg | ORAL_TABLET | Freq: Every day | ORAL | 1 refills | Status: DC

## 2016-11-28 NOTE — Patient Instructions (Addendum)
   IF you received an x-ray today, you will receive an invoice from [facility]. Please contact [facility] at [phone] with questions or concerns regarding your invoice.   IF you received labwork today, you will receive an invoice from [facility]. Please contact [facility] at [phone] with questions or concerns regarding your invoice.   Our billing staff will not be able to assist you with questions regarding bills from these companies.  You will be contacted with the lab results as soon as they are available. The fastest way to get your results is to activate your My Chart account. Instructions are located on the last page of this paperwork. If you have not heard from us regarding the results in 2 weeks, please contact this office.    Health Maintenance, Female Adopting a healthy lifestyle and getting preventive care can go a long way to promote health and wellness. Talk with your health care provider about what schedule of regular examinations is right for you. This is a good chance for you to check in with your provider about disease prevention and staying healthy. In between checkups, there are plenty of things you can do on your own. Experts have done a lot of research about which lifestyle changes and preventive measures are most likely to keep you healthy. Ask your health care provider for more information. Weight and diet Eat a healthy diet  Be sure to include plenty of vegetables, fruits, low-fat dairy products, and lean protein.  Do not eat a lot of foods high in solid fats, added sugars, or salt.  Get regular exercise. This is one of the most important things you can do for your health. ? Most adults should exercise for at least 150 minutes each week. The exercise should increase your heart rate and make you sweat (moderate-intensity exercise). ? Most adults should also do strengthening exercises at least twice a week. This is in addition to the  moderate-intensity exercise.  Maintain a healthy weight  Body mass index (BMI) is a measurement that can be used to identify possible weight problems. It estimates body fat based on height and weight. Your health care provider can help determine your BMI and help you achieve or maintain a healthy weight.  For females 20 years of age and older: ? A BMI below 18.5 is considered underweight. ? A BMI of 18.5 to 24.9 is normal. ? A BMI of 25 to 29.9 is considered overweight. ? A BMI of 30 and above is considered obese.  Watch levels of cholesterol and blood lipids  You should start having your blood tested for lipids and cholesterol at 55 years of age, then have this test every 5 years.  You may need to have your cholesterol levels checked more often if: ? Your lipid or cholesterol levels are high. ? You are older than 55 years of age. ? You are at high risk for heart disease.  Cancer screening Lung Cancer  Lung cancer screening is recommended for adults 55-80 years old who are at high risk for lung cancer because of a history of smoking.  A yearly low-dose CT scan of the lungs is recommended for people who: ? Currently smoke. ? Have quit within the past 15 years. ? Have at least a 30-pack-year history of smoking. A pack year is smoking an average of one pack of cigarettes a day for 1 year.  Yearly screening should continue until it has been 15 years since you quit.  Yearly screening   should stop if you develop a health problem that would prevent you from having lung cancer treatment.  Breast Cancer  Practice breast self-awareness. This means understanding how your breasts normally appear and feel.  It also means doing regular breast self-exams. Let your health care provider know about any changes, no matter how small.  If you are in your 20s or 30s, you should have a clinical breast exam (CBE) by a health care provider every 1-3 years as part of a regular health exam.  If you  are 42 or older, have a CBE every year. Also consider having a breast X-ray (mammogram) every year.  If you have a family history of breast cancer, talk to your health care provider about genetic screening.  If you are at high risk for breast cancer, talk to your health care provider about having an MRI and a mammogram every year.  Breast cancer gene (BRCA) assessment is recommended for women who have family members with BRCA-related cancers. BRCA-related cancers include: ? Breast. ? Ovarian. ? Tubal. ? Peritoneal cancers.  Results of the assessment will determine the need for genetic counseling and BRCA1 and BRCA2 testing.  Cervical Cancer Your health care provider may recommend that you be screened regularly for cancer of the pelvic organs (ovaries, uterus, and vagina). This screening involves a pelvic examination, including checking for microscopic changes to the surface of your cervix (Pap test). You may be encouraged to have this screening done every 3 years, beginning at age 56.  For women ages 41-65, health care providers may recommend pelvic exams and Pap testing every 3 years, or they may recommend the Pap and pelvic exam, combined with testing for human papilloma virus (HPV), every 5 years. Some types of HPV increase your risk of cervical cancer. Testing for HPV may also be done on women of any age with unclear Pap test results.  Other health care providers may not recommend any screening for nonpregnant women who are considered low risk for pelvic cancer and who do not have symptoms. Ask your health care provider if a screening pelvic exam is right for you.  If you have had past treatment for cervical cancer or a condition that could lead to cancer, you need Pap tests and screening for cancer for at least 20 years after your treatment. If Pap tests have been discontinued, your risk factors (such as having a new sexual partner) need to be reassessed to determine if screening should  resume. Some women have medical problems that increase the chance of getting cervical cancer. In these cases, your health care provider may recommend more frequent screening and Pap tests.  Colorectal Cancer  This type of cancer can be detected and often prevented.  Routine colorectal cancer screening usually begins at 55 years of age and continues through 55 years of age.  Your health care provider may recommend screening at an earlier age if you have risk factors for colon cancer.  Your health care provider may also recommend using home test kits to check for hidden blood in the stool.  A small camera at the end of a tube can be used to examine your colon directly (sigmoidoscopy or colonoscopy). This is done to check for the earliest forms of colorectal cancer.  Routine screening usually begins at age 16.  Direct examination of the colon should be repeated every 5-10 years through 55 years of age. However, you may need to be screened more often if early forms of precancerous polyps or  small growths are found.  Skin Cancer  Check your skin from head to toe regularly.  Tell your health care provider about any new moles or changes in moles, especially if there is a change in a mole's shape or color.  Also tell your health care provider if you have a mole that is larger than the size of a pencil eraser.  Always use sunscreen. Apply sunscreen liberally and repeatedly throughout the day.  Protect yourself by wearing long sleeves, pants, a wide-brimmed hat, and sunglasses whenever you are outside.  Heart disease, diabetes, and high blood pressure  High blood pressure causes heart disease and increases the risk of stroke. High blood pressure is more likely to develop in: ? People who have blood pressure in the high end of the normal range (130-139/85-89 mm Hg). ? People who are overweight or obese. ? People who are African American.  If you are 61-36 years of age, have your blood  pressure checked every 3-5 years. If you are 6 years of age or older, have your blood pressure checked every year. You should have your blood pressure measured twice-once when you are at a hospital or clinic, and once when you are not at a hospital or clinic. Record the average of the two measurements. To check your blood pressure when you are not at a hospital or clinic, you can use: ? An automated blood pressure machine at a pharmacy. ? A home blood pressure monitor.  If you are between 7 years and 29 years old, ask your health care provider if you should take aspirin to prevent strokes.  Have regular diabetes screenings. This involves taking a blood sample to check your fasting blood sugar level. ? If you are at a normal weight and have a low risk for diabetes, have this test once every three years after 55 years of age. ? If you are overweight and have a high risk for diabetes, consider being tested at a younger age or more often. Preventing infection Hepatitis B  If you have a higher risk for hepatitis B, you should be screened for this virus. You are considered at high risk for hepatitis B if: ? You were born in a country where hepatitis B is common. Ask your health care provider which countries are considered high risk. ? Your parents were born in a high-risk country, and you have not been immunized against hepatitis B (hepatitis B vaccine). ? You have HIV or AIDS. ? You use needles to inject street drugs. ? You live with someone who has hepatitis B. ? You have had sex with someone who has hepatitis B. ? You get hemodialysis treatment. ? You take certain medicines for conditions, including cancer, organ transplantation, and autoimmune conditions.  Hepatitis C  Blood testing is recommended for: ? Everyone born from [birth year] through 1964. ? Anyone with known risk factors for hepatitis C.  Sexually transmitted infections (STIs)  You should be screened for sexually transmitted  infections (STIs) including gonorrhea and chlamydia if: ? You are sexually active and are younger than 55 years of age. ? You are older than 55 years of age and your health care provider tells you that you are at risk for this type of infection. ? Your sexual activity has changed since you were last screened and you are at an increased risk for chlamydia or gonorrhea. Ask your health care provider if you are at risk.  If you do not have HIV, but are at risk, it  may be recommended that you take a prescription medicine daily to prevent HIV infection. This is called pre-exposure prophylaxis (PrEP). You are considered at risk if: ? You are sexually active and do not regularly use condoms or know the HIV status of your partner(s). ? You take drugs by injection. ? You are sexually active with a partner who has HIV.  Talk with your health care provider about whether you are at high risk of being infected with HIV. If you choose to begin PrEP, you should first be tested for HIV. You should then be tested every 3 months for as long as you are taking PrEP. Pregnancy  If you are premenopausal and you may become pregnant, ask your health care provider about preconception counseling.  If you may become pregnant, take 400 to 800 micrograms (mcg) of folic acid every day.  If you want to prevent pregnancy, talk to your health care provider about birth control (contraception). Osteoporosis and menopause  Osteoporosis is a disease in which the bones lose minerals and strength with aging. This can result in serious bone fractures. Your risk for osteoporosis can be identified using a bone density scan.  If you are 65 years of age or older, or if you are at risk for osteoporosis and fractures, ask your health care provider if you should be screened.  Ask your health care provider whether you should take a calcium or vitamin D supplement to lower your risk for osteoporosis.  Menopause may have certain physical  symptoms and risks.  Hormone replacement therapy may reduce some of these symptoms and risks. Talk to your health care provider about whether hormone replacement therapy is right for you. Follow these instructions at home:  Schedule regular health, dental, and eye exams.  Stay current with your immunizations.  Do not use any tobacco products including cigarettes, chewing tobacco, or electronic cigarettes.  If you are pregnant, do not drink alcohol.  If you are breastfeeding, limit how much and how often you drink alcohol.  Limit alcohol intake to no more than 1 drink per day for nonpregnant women. One drink equals 12 ounces of beer, 5 ounces of wine, or 1 ounces of hard liquor.  Do not use street drugs.  Do not share needles.  Ask your health care provider for help if you need support or information about quitting drugs.  Tell your health care provider if you often feel depressed.  Tell your health care provider if you have ever been abused or do not feel safe at home. This information is not intended to replace advice given to you by your health care provider. Make sure you discuss any questions you have with your health care provider. Document Released: 08/08/2010 Document Revised: 07/01/2015 Document Reviewed: 10/27/2014 Elsevier Interactive Patient Education  2018 Elsevier Inc.  

## 2016-11-28 NOTE — Progress Notes (Signed)
Samantha Pearson  MRN: [id number] DOB: 11-Mar-1960  PCP: Mancel Bale, PA-C  Subjective:  Pt presents to clinic for a CPE.  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 She has slightly torn ACL knee and h/o past injury to left foot.  She has tried 3 different types of steel toes shoes and they all have those problems.    She uses mobic and naproxen but not together.  Last dental exam: 2 year ago Last vision exam: wears glasses Last pap: 4/18 - normal Last mammo: this am Last colonoscopy: 2016  Left 5th digit - feels like it is weak and it is getting stuck - hard bending it and getting it back straight  Declines flu vaccine.  Typical meals for patient: 2-3 meals with some snacks - mostly home cooked now that she is living with mom - trying to not eat after 6/7 pm (snacks - junk food) Typical beverage choices: water Exercises: no exercise - works 12 h rotating shifts which is hard Sleeps: 6 hrs per night and sleeping well - she does get more sleep on the weekend 8 hours   Patient Active Problem List   Diagnosis Date Noted  . Left knee pain 03/14/2016  . Class 3 obesity due to excess calories with serious comorbidity and body mass index (BMI) of 40.0 to 44.9 in adult 08/18/2013  . Stress 05/22/2011  . ESSENTIAL HYPERTENSION, BENIGN 08/07/2008    Review of Systems  Constitutional: Negative.   HENT: Negative.   Eyes: Negative.   Respiratory: Negative.   Cardiovascular: Positive for leg swelling (left foot/ankle when wearing steel toes shoes only).  Gastrointestinal: Negative.   Endocrine: Negative.   Genitourinary: Negative.   Musculoskeletal: Positive for gait problem (after wearing steel toe shoes at work).  Skin: Negative.   Allergic/Immunologic:  Negative.   Hematological: Negative.   Psychiatric/Behavioral: Negative.      Current Outpatient Prescriptions on File Prior to Visit  Medication Sig Dispense Refill  . Glucosamine-Chondroitin (GLUCOSAMINE CHONDR COMPLEX PO) Take by mouth daily.    . meloxicam (MOBIC) 7.5 MG tablet Take 1-2 tablets (7.5-15 mg total) by mouth daily. 180 tablet 0  . Multiple Vitamin (MULTIVITAMIN) tablet Take 1 tablet by mouth daily.    . naproxen sodium (ANAPROX DS) 550 MG tablet Take 1 tablet (550 mg total) by mouth 2 (two) times daily with a meal. 180 tablet 1  . OVER THE COUNTER MEDICATION OTC Vitamin  B50 500 mg taking daily     No current facility-administered medications on file prior to visit.     Allergies  Allergen Reactions  . Doxycycline Hives, Itching and Swelling  . Norvasc [Amlodipine Besylate] Swelling    Feet swelling  . Prednisone Swelling  . Septra [Bactrim] Hives, Itching and Swelling  . Latex Swelling and Rash    Social History   Social History  . Marital status: Single    Spouse name: N/A  . Number of children: 1  . Years of education: N/A   Occupational History  . 3 jobs     Paramedic   Social History Main Topics  . Smoking status: Never Smoker  . Smokeless tobacco: Never Used  Comment: Nonsmoker  . Alcohol use No  . Drug use: No  . Sexual activity: Yes    Birth control/ protection: None   Other Topics Concern  . 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 CESAREAN SECTION    . DILATION AND CURETTAGE OF UTERUS      laparoscopic    Family History  Problem Relation Age of Onset  . Heart failure Father        CHF  . Prostate cancer Father   . Colon cancer Maternal Uncle   . Colon polyps Mother   . Cancer Mother 45       breast cancer  . Hypertension Sister   . Hypertension Brother   . Hypertension Sister   . Hypertension Sister   . Hypertension Sister   . Esophageal cancer Neg Hx   . Stomach cancer Neg Hx   . Rectal cancer Neg Hx      Objective:  BP 136/70   Pulse 98   Temp 99.5 F (37.5 C) (Oral)   Resp 18   Ht 4' 11" (1.499 m)   Wt 218 lb 6.4 oz (99.1 kg)   LMP 04/09/2014   SpO2 99%   BMI 44.11 kg/m   Physical Exam  Constitutional: She is oriented to person, place, and time and well-developed, well-nourished, and in no distress.  HENT:  Head: Normocephalic and atraumatic.  Right Ear: Hearing, tympanic membrane, external ear and ear canal normal.  Left Ear: Hearing, tympanic membrane, external ear and ear canal normal.  Nose: Nose normal.  Mouth/Throat: Uvula is midline, oropharynx is clear and moist and mucous membranes are normal.  Eyes: Pupils are equal, round, and reactive to light. Conjunctivae and EOM are normal.  Neck: Trachea normal and normal range of motion. Neck supple. No thyroid mass and no thyromegaly present.  Cardiovascular: Normal rate, regular rhythm and normal heart sounds.   No murmur heard. Pulmonary/Chest: Effort normal and breath sounds normal. She has no wheezes.  Abdominal: Soft. Bowel sounds are normal. There is no tenderness.  Musculoskeletal: Normal range of motion.       Right lower leg: She exhibits no edema.       Left lower leg: She exhibits no edema.  Lymphadenopathy:    She has no cervical adenopathy.  Neurological: She is alert and oriented to person, place, and time. She has normal motor skills, normal sensation, normal strength and normal reflexes. Gait normal.  Skin: Skin is warm and dry.  Psychiatric: Mood, memory, affect and judgment  normal.    Wt Readings from Last 3 Encounters:  11/28/16 218 lb 6.4 oz (99.1 kg)  08/22/16 223 lb 9.6 oz (101.4 kg)  07/21/16 227 lb (103 kg)     Visual Acuity Screening   Right eye Left eye Both eyes  Without correction:     With correction: 20/13-1 20/15-2 20/10-1    Assessment and  Plan :  Annual physical exam  Elevated glucose - Plan: Hemoglobin A1c, CMP14+EGFR  Screening for deficiency anemia - Plan: CBC with Differential/Platelet  Essential hypertension - Plan: chlorthalidone (HYGROTON) 50 MG tablet, lisinopril (PRINIVIL,ZESTRIL) 40 MG tablet - continue medications - BP well controlled -   Hypokalemia - Plan: Basic metabolic panel  Pt has no leg swelling today but due to her history note written for her to be able to wear tennis shoes to work as she has tried 3 types of steel toed shoes all resulting in left ankle swelling and she has not had this problem yet with shoes on her days off and she did not have the problem prior to the new steel toe policy at work. She she notices that the swelling returns she will recheck.  Windell Hummingbird PA-C  [facility] 12/01/2016 11:27 AM

## 2016-11-29 LAB — CBC WITH DIFFERENTIAL/PLATELET
BASOS ABS: 0 10*3/uL (ref 0.0–0.2)
Basos: 0 %
EOS (ABSOLUTE): 0.3 10*3/uL (ref 0.0–0.4)
EOS: 5 %
HEMOGLOBIN: 12.4 g/dL (ref 11.1–15.9)
Hematocrit: 37.9 % (ref 34.0–46.6)
IMMATURE GRANULOCYTES: 0 %
Immature Grans (Abs): 0 10*3/uL (ref 0.0–0.1)
Lymphocytes Absolute: 2.7 10*3/uL (ref 0.7–3.1)
Lymphs: 41 %
MCH: 27.3 pg (ref 26.6–33.0)
MCHC: 32.7 g/dL (ref 31.5–35.7)
MCV: 84 fL (ref 79–97)
MONOCYTES: 6 %
Monocytes Absolute: 0.4 10*3/uL (ref 0.1–0.9)
NEUTROS PCT: 48 %
Neutrophils Absolute: 3.1 10*3/uL (ref 1.4–7.0)
Platelets: 318 10*3/uL (ref 150–379)
RBC: 4.54 x10E6/uL (ref 3.77–5.28)
RDW: 14.7 % (ref 12.3–15.4)
WBC: 6.5 10*3/uL (ref 3.4–10.8)

## 2016-11-29 LAB — CMP14+EGFR
ALBUMIN: 4.7 g/dL (ref 3.5–5.5)
ALT: 20 IU/L (ref 0–32)
AST: 23 IU/L (ref 0–40)
Albumin/Globulin Ratio: 1.5 (ref 1.2–2.2)
Alkaline Phosphatase: 99 IU/L (ref 39–117)
BUN/Creatinine Ratio: 24 — ABNORMAL HIGH (ref 9–23)
BUN: 20 mg/dL (ref 6–24)
Bilirubin Total: 1 mg/dL (ref 0.0–1.2)
CALCIUM: 10 mg/dL (ref 8.7–10.2)
CO2: 27 mmol/L (ref 20–29)
CREATININE: 0.83 mg/dL (ref 0.57–1.00)
Chloride: 98 mmol/L (ref 96–106)
GFR calc Af Amer: 92 mL/min/{1.73_m2} (ref >59)
GFR, EST NON AFRICAN AMERICAN: 80 mL/min/{1.73_m2} (ref >59)
GLOBULIN, TOTAL: 3.2 g/dL (ref 1.5–4.5)
GLUCOSE: 86 mg/dL (ref 65–99)
Potassium: 3.3 mmol/L — ABNORMAL LOW (ref 3.5–5.2)
SODIUM: 142 mmol/L (ref 134–144)
Total Protein: 7.9 g/dL (ref 6.0–8.5)

## 2016-11-29 LAB — HEMOGLOBIN A1C
ESTIMATED AVERAGE GLUCOSE: 114 mg/dL
Hgb A1c MFr Bld: 5.6 % (ref 4.8–5.6)

## 2017-02-22 ENCOUNTER — Telehealth: Payer: Self-pay | Admitting: Physician Assistant

## 2017-02-22 NOTE — Telephone Encounter (Signed)
Called pt to reschedule her appt with Benny LennertSarah Weber from 05/29/17 to a different day.  When pt calls back in, please reschedule her with Benny LennertSarah Weber.  Thanks!

## 2017-04-23 ENCOUNTER — Ambulatory Visit (INDEPENDENT_AMBULATORY_CARE_PROVIDER_SITE_OTHER): Payer: BLUE CROSS/BLUE SHIELD

## 2017-04-23 ENCOUNTER — Encounter: Payer: Self-pay | Admitting: Physician Assistant

## 2017-04-23 ENCOUNTER — Other Ambulatory Visit: Payer: Self-pay

## 2017-04-23 ENCOUNTER — Ambulatory Visit: Payer: BLUE CROSS/BLUE SHIELD | Admitting: Physician Assistant

## 2017-04-23 VITALS — BP 124/66 | HR 97 | Temp 99.2°F | Resp 18 | Ht 59.45 in | Wt 221.8 lb

## 2017-04-23 DIAGNOSIS — J0100 Acute maxillary sinusitis, unspecified: Secondary | ICD-10-CM | POA: Diagnosis not present

## 2017-04-23 DIAGNOSIS — M7989 Other specified soft tissue disorders: Secondary | ICD-10-CM

## 2017-04-23 DIAGNOSIS — M19041 Primary osteoarthritis, right hand: Secondary | ICD-10-CM | POA: Diagnosis not present

## 2017-04-23 LAB — POCT CBC
Granulocyte percent: 57.1 %G (ref 37–80)
HCT, POC: 39.1 % (ref 37.7–47.9)
Hemoglobin: 12.1 g/dL — AB (ref 12.2–16.2)
LYMPH, POC: 1.7 (ref 0.6–3.4)
MCH, POC: 26.2 pg — AB (ref 27–31.2)
MCHC: 30.8 g/dL — AB (ref 31.8–35.4)
MCV: 85.2 fL (ref 80–97)
MID (CBC): 0.3 (ref 0–0.9)
MPV: 8.4 fL (ref 0–99.8)
POC Granulocyte: 2.7 (ref 2–6.9)
POC LYMPH PERCENT: 36.1 %L (ref 10–50)
POC MID %: 6.8 % (ref 0–12)
Platelet Count, POC: 290 10*3/uL (ref 142–424)
RBC: 4.6 M/uL (ref 4.04–5.48)
RDW, POC: 14.1 %
WBC: 4.8 10*3/uL (ref 4.6–10.2)

## 2017-04-23 MED ORDER — OLOPATADINE HCL 0.2 % OP SOLN: 1.0000 [drp] | Freq: Every day | OPHTHALMIC | 0 refills | Status: DC

## 2017-04-23 MED ORDER — OXYMETAZOLINE HCL 0.05 % NA SOLN: 1.0000 | Freq: Two times a day (BID) | NASAL | 0 refills | Status: DC

## 2017-04-23 MED ORDER — AZELASTINE HCL 0.1 % NA SOLN: 2.0000 | Freq: Two times a day (BID) | NASAL | 0 refills | Status: DC

## 2017-04-23 NOTE — Patient Instructions (Addendum)
For sinus pressure, please continue oral antihistamine. Start using daily afrin for the next few days. Stop use after 3 days. You can continue with nasal saline rinses. I have given you a prescription for eye drops that should provide relief. If no improve in sinus issues or things worsen in 5-7 days, please return.   In terms of thumb pain, this is likely arthritis flare up. It could be a gout flare as well. What I recommend is continuing with mobic daily. Return when you are not in pain and lets repeat a uric acid to tell if it your levels are elevated. In meantime, avoid foods that exacerbate gout. If any of your redness or swelling worsens, return sooner. Thank you for letting me participate in your health and well being.     Sinusitis, Adult Sinusitis is soreness and inflammation of your sinuses. Sinuses are hollow spaces in the bones around your face. They are located:  Around your eyes.  In the middle of your forehead.  Behind your nose.  In your cheekbones.  Your sinuses and nasal passages are lined with a stringy fluid (mucus). Mucus normally drains out of your sinuses. When your nasal tissues get inflamed or swollen, the mucus can get trapped or blocked so air cannot flow through your sinuses. This lets bacteria, viruses, and funguses grow, and that leads to infection. Follow these instructions at home: Medicines  Take, use, or apply over-the-counter and prescription medicines only as told by your doctor. These may include nasal sprays.  If you were prescribed an antibiotic medicine, take it as told by your doctor. Do not stop taking the antibiotic even if you start to feel better. Hydrate and Humidify  Drink enough water to keep your pee (urine) clear or pale yellow.  Use a cool mist humidifier to keep the humidity level in your home above 50%.  Breathe in steam for 10-15 minutes, 3-4 times a day or as told by your doctor. You can do this in the bathroom while a hot shower  is running.  Try not to spend time in cool or dry air. Rest  Rest as much as possible.  Sleep with your head raised (elevated).  Make sure to get enough sleep each night. General instructions  Put a warm, moist washcloth on your face 3-4 times a day or as told by your doctor. This will help with discomfort.  Wash your hands often with soap and water. If there is no soap and water, use hand sanitizer.  Do not smoke. Avoid being around people who are smoking (secondhand smoke).  Keep all follow-up visits as told by your doctor. This is important. Contact a doctor if:  You have a fever.  Your symptoms get worse.  Your symptoms do not get better within 10 days. Get help right away if:  You have a very bad headache.  You cannot stop throwing up (vomiting).  You have pain or swelling around your face or eyes.  You have trouble seeing.  You feel confused.  Your neck is stiff.  You have trouble breathing. This information is not intended to replace advice given to you by your health care provider. Make sure you discuss any questions you have with your health care provider. Document Released: 07/12/2007 Document Revised: 09/19/2015 Document Reviewed: 11/18/2014 Elsevier Interactive Patient Education  2018 [employer].  Osteoarthritis Osteoarthritis is a type of arthritis that affects tissue that covers the ends of bones in joints (cartilage). Cartilage acts as a cushion  between the bones and helps them move smoothly. Osteoarthritis results when cartilage in the joints gets worn down. Osteoarthritis is sometimes called "wear and tear" arthritis. Osteoarthritis is the most common form of arthritis. It often occurs in older people. It is a condition that gets worse over time (a progressive condition). Joints that are most often affected by this condition are in:  Fingers.  Toes.  Hips.  Knees.  Spine, including neck and lower back.  What are the causes? This  condition is caused by age-related wearing down of cartilage that covers the ends of bones. What increases the risk? The following factors may make you more likely to develop this condition:  Older age.  Being overweight or obese.  Overuse of joints, such as in athletes.  Past injury of a joint.  Past surgery on a joint.  Family history of osteoarthritis.  What are the signs or symptoms? The main symptoms of this condition are pain, swelling, and stiffness in the joint. The joint may lose its shape over time. Small pieces of bone or cartilage may break off and float inside of the joint, which may cause more pain and damage to the joint. Small deposits of bone (osteophytes) may grow on the edges of the joint. Other symptoms may include:  A grating or scraping feeling inside the joint when you move it.  Popping or creaking sounds when you move.  Symptoms may affect one or more joints. Osteoarthritis in a major joint, such as your knee or hip, can make it painful to walk or exercise. If you have osteoarthritis in your hands, you might not be able to grip items, twist your hand, or control small movements of your hands and fingers (fine motor skills). How is this diagnosed? This condition may be diagnosed based on:  Your medical history.  A physical exam.  Your symptoms.  X-rays of the affected joint(s).  Blood tests to rule out other types of arthritis.  How is this treated? There is no cure for this condition, but treatment can help to control pain and improve joint function. Treatment plans may include:  A prescribed exercise program that allows for rest and joint relief. You may work with a physical therapist.  A weight control plan.  Pain relief techniques, such as: ? Applying heat and cold to the joint. ? Electric pulses delivered to nerve endings under the skin (transcutaneous electrical nerve stimulation, or TENS). ? Massage. ? Certain nutritional  supplements.  NSAIDs or prescription medicines to help relieve pain.  Medicine to help relieve pain and inflammation (corticosteroids). This can be given by mouth (orally) or as an injection.  Assistive devices, such as a brace, wrap, splint, specialized glove, or cane.  Surgery, such as: ? An osteotomy. This is done to reposition the bones and relieve pain or to remove loose pieces of bone and cartilage. ? Joint replacement surgery. You may need this surgery if you have very bad (advanced) osteoarthritis.  Follow these instructions at home: Activity  Rest your affected joints as directed by your health care provider.  Do not drive or use heavy machinery while taking prescription pain medicine.  Exercise as directed. Your health care provider or physical therapist may recommend specific types of exercise, such as: ? Strengthening exercises. These are done to strengthen the muscles that support joints that are affected by arthritis. They can be performed with weights or with exercise bands to add resistance. ? Aerobic activities. These are exercises, such as brisk walking  or water aerobics, that get your heart pumping. ? Range-of-motion activities. These keep your joints easy to move. ? Balance and agility exercises. Managing pain, stiffness, and swelling  If directed, apply heat to the affected area as often as told by your health care provider. Use the heat source that your health care provider recommends, such as a moist heat pack or a heating pad. ? If you have a removable assistive device, remove it as told by your health care provider. ? Place a towel between your skin and the heat source. If your health care provider tells you to keep the assistive device on while you apply heat, place a towel between the assistive device and the heat source. ? Leave the heat on for 20-30 minutes. ? Remove the heat if your skin turns bright red. This is especially important if you are unable to  feel pain, heat, or cold. You may have a greater risk of getting burned.  If directed, put ice on the affected joint: ? If you have a removable assistive device, remove it as told by your health care provider. ? Put ice in a plastic bag. ? Place a towel between your skin and the bag. If your health care provider tells you to keep the assistive device on during icing, place a towel between the assistive device and the bag. ? Leave the ice on for 20 minutes, 2-3 times a day. General instructions  Take over-the-counter and prescription medicines only as told by your health care provider.  Maintain a healthy weight. Follow instructions from your health care provider for weight control. These may include dietary restrictions.  Do not use any products that contain nicotine or tobacco, such as cigarettes and e-cigarettes. These can delay bone healing. If you need help quitting, ask your health care provider.  Use assistive devices as directed by your health care provider.  Keep all follow-up visits as told by your health care provider. This is important. Where to find more information:  [employer] of Arthritis and Musculoskeletal and Skin Diseases: www.niams.http://www.myers.net/  [employer] on Aging: https://walker.com/  American College of Rheumatology: www.rheumatology.org Contact a health care provider if:  Your skin turns red.  You develop a rash.  You have pain that gets worse.  You have a fever along with joint or muscle aches. Get help right away if:  You lose a lot of weight.  You suddenly lose your appetite.  You have night sweats. Summary  Osteoarthritis is a type of arthritis that affects tissue covering the ends of bones in joints (cartilage).  This condition is caused by age-related wearing down of cartilage that covers the ends of bones.  The main symptom of this condition is pain, swelling, and stiffness in the joint.  There is no cure for this condition, but  treatment can help to control pain and improve joint function. This information is not intended to replace advice given to you by your health care provider. Make sure you discuss any questions you have with your health care provider. Document Released: 01/23/2005 Document Revised: 09/27/2015 Document Reviewed: 09/27/2015 Elsevier Interactive Patient Education  2018 [employer].    IF you received an x-ray today, you will receive an invoice from [facility]. Please contact [facility] at [phone] with questions or concerns regarding your invoice.   IF you received labwork today, you will receive an invoice from [facility]. Please contact [facility] at [phone] with questions or concerns regarding your invoice.   Our billing staff  will not be able to assist you with questions regarding bills from these companies.  You will be contacted with the lab results as soon as they are available. The fastest way to get your results is to activate your My Chart account. Instructions are located on the last page of this paperwork. If you have not heard from [place] regarding the results in 2 weeks, please contact this office.

## 2017-04-23 NOTE — Progress Notes (Signed)
MRN: [id number] DOB: 1960/06/03  Subjective:   Samantha Pearson is a 57 y.o. female presenting for chief complaint of Sinus Problem (X 3 days); Hand Pain (X 6 days- right hand thumb pain); and Tingling (X 6 days in left hand) .  Reports 3 day history of sinus headache, sinus congestion, rhinorrhea, itchy watery eyes, sore throat and sneezing, fatigue. Has had 4 nosebleeds since this all started. Last 5-10 minutes. Has seen ENT for frequent nosebleeds in the past and had to have cautery performed. Denies fever, cough, wheezing, shortness of breath and chest pain, nausea, vomiting, abdominal pain and diarrhea. Has history of seasonal allergies, it gets worse this time of year. She is taking daily zyrtec. Was supposed to take nasacort but her insurance does not cover it. Will try nasal saline rinses as weel.  No history of asthma. Patient  Has had flu shot this season.   Swollen red warm right thumb x 6 days. Improving. Had associated pain and tingling. Has gotten better over the past week with use of naproxen and epsom salt bathes.  Has also been doing lots more work at her job. Also had lots of stew beef last week then woke up with this pain.  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  PMH also suspicious for gout, as she has had an elevated uric acid in the past.  However, she has not returned for uric acid lab when she is not having pain or swelling in hands.  Denies fever, chills, nausea, vomiting, and numbness.  Review of Systems  Constitutional: Negative for chills and diaphoresis.  Eyes: Negative for blurred vision and double vision.  Neurological: Negative for speech change.    Daviana has a current medication list which includes the following prescription(s): cetirizine, chlorthalidone, glucosamine-chondroitin, lisinopril, meloxicam, multivitamin, naproxen sodium, OVER THE COUNTER MEDICATION, potassium, and turmeric. Also is allergic to doxycycline; norvasc [amlodipine besylate]; prednisone; septra  [bactrim]; and latex.  Marthe  has a past medical history of Allergy, Anxiety, Arthritis, Chest pain (08/03/08), Depression, Dyspnea, Edema, Heart murmur, Hypertension, and Obesity. Also  has a past surgical history that includes Cesarean section and Dilation and curettage of uterus.   Objective:   Vitals: BP 124/66 (BP Location: Left Arm, Patient Position: Sitting, Cuff Size: Small)   Pulse 97   Temp 99.2 F (37.3 C) (Oral)   Resp 18   Ht 4' 11.45" (1.51 m)   Wt 221 lb 12.8 oz (100.6 kg)   LMP 04/09/2014   SpO2 97%   BMI 44.12 kg/m   Physical Exam  Constitutional: She is oriented to person, place, and time. [name].  HENT:  Head: Normocephalic and atraumatic.  Right Ear: Tympanic membrane, external ear and ear canal normal.  Left Ear: External ear and ear canal normal. A middle ear effusion is present.  Nose: Mucosal edema (left nostril >right nostril) present. No epistaxis. Right sinus exhibits no maxillary sinus tenderness and no frontal sinus tenderness. Left sinus exhibits maxillary sinus tenderness. Left sinus exhibits no frontal sinus tenderness.  Eyes: Conjunctivae are normal. Right eye exhibits discharge (watery). Left eye exhibits discharge (watery).  Neck: Normal range of motion.  Cardiovascular: Normal rate, regular rhythm and normal heart sounds.  Pulses:      Radial pulses are 2+ on the right side, and 2+ on the left side.  Pulmonary/Chest: Effort normal.  Musculoskeletal:       Hands: Neurological: She is alert and oriented to person, place,  and time.  Skin: Skin is warm and dry.  Psychiatric: She has a normal mood and affect.  Vitals reviewed.   Results for orders placed or performed in visit on 04/23/17 (from the past 24 hour(s))  POCT CBC     Status: Abnormal   Collection Time: 04/23/17  3:35 PM  Result Value Ref Range   WBC 4.8 4.6 - 10.2 K/uL   Lymph, poc 1.7 0.6 - 3.4   POC LYMPH PERCENT 36.1 10 - 50 %L   MID  (cbc) 0.3 0 - 0.9   POC MID % 6.8 0 - 12 %M   POC Granulocyte 2.7 2 - 6.9   Granulocyte percent 57.1 37 - 80 %G   RBC 4.60 4.04 - 5.48 M/uL   Hemoglobin 12.1 (A) 12.2 - 16.2 g/dL   HCT, POC 78.239.1 95.637.7 - 47.9 %   MCV 85.2 80 - 97 fL   MCH, POC 26.2 (A) 27 - 31.2 pg   MCHC 30.8 (A) 31.8 - 35.4 g/dL   RDW, POC 21.314.1 %   Platelet Count, POC 290 142 - 424 K/uL   MPV 8.4 0 - 99.8 fL   Dg Finger Thumb Right  Result Date: 04/23/2017 CLINICAL DATA:  Redness, warmth, pain and swelling about the right thumb. EXAM: RIGHT THUMB 2+V COMPARISON:  None. FINDINGS: The patient has joint space narrowing and osteophytosis about both the IP joint of the thumb and first [facility] joint. Degenerative change is advanced about both joints but worse at the first [facility]. No erosion is identified. Mineralization and alignment are normal. No fracture or dislocation. IMPRESSION: Negative for gout. Osteoarthritis IP joint of the thumb and first [facility] joint appears more severe at the first [facility]. Electronically Signed   By: Drusilla Kannerhomas  Dalessio M.D.   On: 04/23/2017 15:55    Assessment and Plan :  1. Acute non-recurrent maxillary sinusitis - Likely viral in etiology d/t reassuring physical exam findings and labs. - Advised supportive care, offered symptomatic relief. - Return to clinic if symptoms worsen or fail to improve in 5-7 days, otherwise return to clinic as needed. - oxymetazoline (AFRIN NASAL SPRAY) 0.05 % nasal spray; Place 1 spray into both nostrils 2 (two) times daily.  Dispense: 30 mL; Refill: 0 - Olopatadine HCl 0.2 % SOLN; Apply 1 drop to eye daily.  Dispense: 2.5 mL; Refill: 0 - azelastine (ASTELIN) 0.1 % nasal spray; Place 2 sprays into both nostrils 2 (two) times daily. Use in each nostril as directed  Dispense: 30 mL; Refill: 0  2. Swelling of right thumb DDx  includes OA flareup vs gout.  Plain film shows OA of IP joint and is negative for gout.  She has had increase in  activity of her hands at work.  However,  she has also had increased consumption of red meat prior to incident.  Do not suspect infectious etiology at this time. WBC normal.  Vital stable.  Labs pending.  Recommended patient return to office when she is not in a flare and have uric acid repeated as it has been high in the past.  This is important as if she does have gout she likely needs to be on something other than chlorthalidone for bp control.  I recommended avoiding foods high in purine's and continuing with Mobic daily for pain. - POCT CBC - DG Finger Thumb Right; Future - Sedimentation rate   Benjiman CoreBrittany Jimmie Rueter, PA-C  [facility] at [facility] 04/23/2017 3:59 PM

## 2017-04-24 LAB — SEDIMENTATION RATE: SED RATE: 32 mm/h (ref 0–40)

## 2017-04-28 ENCOUNTER — Encounter: Payer: Self-pay | Admitting: Physician Assistant

## 2017-05-29 ENCOUNTER — Ambulatory Visit: Payer: BLUE CROSS/BLUE SHIELD | Admitting: Physician Assistant

## 2017-06-05 ENCOUNTER — Other Ambulatory Visit: Payer: Self-pay

## 2017-06-05 ENCOUNTER — Ambulatory Visit: Payer: BLUE CROSS/BLUE SHIELD | Admitting: Physician Assistant

## 2017-06-05 ENCOUNTER — Encounter: Payer: Self-pay | Admitting: Physician Assistant

## 2017-06-05 VITALS — BP 138/62 | HR 97 | Temp 99.4°F | Ht 60.24 in | Wt 217.0 lb

## 2017-06-05 DIAGNOSIS — M79641 Pain in right hand: Secondary | ICD-10-CM

## 2017-06-05 DIAGNOSIS — M7989 Other specified soft tissue disorders: Secondary | ICD-10-CM

## 2017-06-05 DIAGNOSIS — I10 Essential (primary) hypertension: Secondary | ICD-10-CM | POA: Diagnosis not present

## 2017-06-05 DIAGNOSIS — E790 Hyperuricemia without signs of inflammatory arthritis and tophaceous disease: Secondary | ICD-10-CM

## 2017-06-05 DIAGNOSIS — M79642 Pain in left hand: Secondary | ICD-10-CM

## 2017-06-05 MED ORDER — OLMESARTAN MEDOXOMIL 40 MG PO TABS: 40.0000 mg | ORAL_TABLET | Freq: Every day | ORAL | 1 refills | Status: DC

## 2017-06-05 NOTE — Patient Instructions (Addendum)
Stop chlorthalidone and lisinopril - we are going to start the benicar to see if that will control your blood pressure better   We checked your labs today - I will let you know those results.    IF you received an x-ray today, you will receive an invoice from [facility]. Please contact [facility] at [phone] with questions or concerns regarding your invoice.   IF you received labwork today, you will receive an invoice from [facility]. Please contact [facility] at [phone] with questions or concerns regarding your invoice.   Our billing staff will not be able to assist you with questions regarding bills from these companies.  You will be contacted with the lab results as soon as they are available. The fastest way to get your results is to activate your My Chart account. Instructions are located on the last page of this paperwork. If you have not heard from [place] regarding the results in 2 weeks, please contact this office.

## 2017-06-05 NOTE — Progress Notes (Signed)
Samantha Pearson  MRN: [id number] DOB: 08/12/1960  PCP: Mancel Bale, PA-C  Chief Complaint  Patient presents with  . Follow-up    blood management    Subjective:  Pt presents to clinic for BP medication recheck and continued pain in her fingers.  She went [place] in 03/2017 - she has had increased finger and thumb pain since this time.  She does quick grasping all day long - she is currently on an 8 hour shift but in June and July in the past they have had mandatory overtime.  Her fingers are swollen and then they itch and she scratches and then she get red and more swollen.  She feels like the pain in her fingers leads to swelling which then leads her to scratch.  She checks her BP at home - 120/60.  She takes her blood pressure medicines as prescribed.  There was some question in her past regarding gout though we are not sure where that she actually had that or not.  Her mother has significant osteoarthritis and does have to see an arthritis specialist.  History is obtained by patient.  Review of Systems  Constitutional: Negative for chills and fever.  Eyes: Negative for visual disturbance.  Respiratory: Negative for shortness of breath.   Cardiovascular: Negative for chest pain, palpitations and leg swelling.  Neurological: Negative for dizziness, light-headedness and headaches.    Patient Active Problem List   Diagnosis Date Noted  . Left knee pain 03/14/2016  . Class 3 obesity due to excess calories with serious comorbidity and body mass index (BMI) of 40.0 to 44.9 in adult 08/18/2013  . Stress 05/22/2011  . ESSENTIAL HYPERTENSION, BENIGN 08/07/2008    Current Outpatient Medications on File Prior to Visit  Medication Sig Dispense Refill  . cetirizine (ZYRTEC) 10 MG tablet Take 10 mg by mouth daily.    . Glucosamine-Chondroitin (GLUCOSAMINE CHONDR COMPLEX PO) Take by mouth daily.    . meloxicam (MOBIC) 7.5 MG tablet Take 1-2 tablets (7.5-15 mg total)  by mouth daily. 180 tablet 0  . Multiple Vitamin (MULTIVITAMIN) tablet Take 1 tablet by mouth daily.    . naproxen sodium (ANAPROX DS) 550 MG tablet Take 1 tablet (550 mg total) by mouth 2 (two) times daily with a meal. 180 tablet 1  . OVER THE COUNTER MEDICATION OTC Vitamin  B50 500 mg taking daily    . Potassium 99 MG TABS Take by mouth daily.    . Turmeric POWD 1,950 mg by Does not apply route 3 (three) times daily.     No current facility-administered medications on file prior to visit.     Allergies  Allergen Reactions  . Doxycycline Hives, Itching and Swelling  . Norvasc [Amlodipine Besylate] Swelling    Feet swelling  . Prednisone Swelling  . Septra [Bactrim] Hives, Itching and Swelling  . Latex Swelling and Rash    Past Medical History:  Diagnosis Date  . Allergy   . Anxiety   . Arthritis   . Chest pain 08/03/08   Urgent care visit  . Depression   . Dyspnea   . Edema   . Heart murmur   . Hypertension   . 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 Smoking status: Never Smoker  . Smokeless tobacco: Never Used  . Tobacco comment: Nonsmoker  Substance Use Topics  . Alcohol use: No    Alcohol/week: 0.0 oz  . Drug use: No   family history includes Cancer (age of onset: 4) in her mother; Colon cancer in her maternal uncle; Colon polyps in her mother; Heart failure in her father; Hypertension in her brother, sister, sister, sister, and sister; Prostate cancer in her father.     Objective:  BP 138/62 (BP Location: Left Arm, Patient Position:  Sitting, Cuff Size: Large)   Pulse 97   Temp 99.4 F (37.4 C) (Oral)   Ht 5' 0.24" (1.53 m)   Wt 217 lb (98.4 kg)   LMP 04/09/2014   SpO2 97%   BMI 42.05 kg/m  Body mass index is 42.05 kg/m.  Physical Exam  Constitutional: She is oriented to person, place, and time.  HENT:  Head: Normocephalic and atraumatic.  Right Ear: Hearing and external ear normal.  Left Ear: Hearing and external ear normal.  Eyes: Conjunctivae are normal.  Neck: Normal range of motion.  Cardiovascular: Normal rate, regular rhythm and normal heart sounds.  No murmur heard. Pulmonary/Chest: Effort normal and breath sounds normal.  Musculoskeletal:       Right hand: She exhibits tenderness (DIP worse in 1st, 2nd digit).       Left hand: She exhibits tenderness (DIP joints - thumb and 2nd digit are the worse). Normal sensation noted. Decreased strength (2nd to pain) noted.       Right lower leg: She exhibits no edema.       Left lower leg: She exhibits no edema.  Joints are swollen but no erythema bilateral hands  Neurological: She is alert and oriented to person, place, and time.  Skin: Skin is warm and dry.  Psychiatric: Judgment normal.  Vitals reviewed.   Assessment and Plan :  Elevated uric acid in blood - Plan: Uric Acid, Ambulatory referral to Rheumatology -recheck uric acid level today she has had one isolated elevation in the past.  Her pain does seem to be cyclical based on her work patterns at work.  She has had normal sed rates in the past but at this point due to her continued pain a rheumatology evaluation would be appropriate at this point.  Essential hypertension - Plan: CMP14+EGFR, olmesartan (BENICAR) 40 MG tablet -due to possibilities of gout.  Chlorthalidone and without good blood pressure control here in the office we will change from lisinopril to Benicar to see if we get improved results.   She will recheck in a month to determine if this is been successful.  Finger  swelling  Bilateral hand pain - Plan: Ambulatory referral to Rheumatology  Windell Hummingbird PA-C  [facility] 06/05/2017 2:14 PM

## 2017-06-06 LAB — CMP14+EGFR
ALK PHOS: 93 IU/L (ref 39–117)
ALT: 20 IU/L (ref 0–32)
AST: 23 IU/L (ref 0–40)
Albumin/Globulin Ratio: 1.3 (ref 1.2–2.2)
Albumin: 4.3 g/dL (ref 3.5–5.5)
BUN/Creatinine Ratio: 19 (ref 9–23)
BUN: 16 mg/dL (ref 6–24)
Bilirubin Total: 0.6 mg/dL (ref 0.0–1.2)
CHLORIDE: 99 mmol/L (ref 96–106)
CO2: 27 mmol/L (ref 20–29)
CREATININE: 0.86 mg/dL (ref 0.57–1.00)
Calcium: 9.8 mg/dL (ref 8.7–10.2)
GFR calc Af Amer: 87 mL/min/{1.73_m2} (ref >59)
GFR calc non Af Amer: 76 mL/min/{1.73_m2} (ref >59)
GLUCOSE: 96 mg/dL (ref 65–99)
Globulin, Total: 3.2 g/dL (ref 1.5–4.5)
Potassium: 3.5 mmol/L (ref 3.5–5.2)
Sodium: 140 mmol/L (ref 134–144)
Total Protein: 7.5 g/dL (ref 6.0–8.5)

## 2017-06-06 LAB — URIC ACID: URIC ACID: 9.5 mg/dL — AB (ref 2.5–7.1)

## 2017-06-15 ENCOUNTER — Ambulatory Visit: Payer: BLUE CROSS/BLUE SHIELD | Admitting: Physician Assistant

## 2017-06-15 ENCOUNTER — Ambulatory Visit (INDEPENDENT_AMBULATORY_CARE_PROVIDER_SITE_OTHER): Payer: BLUE CROSS/BLUE SHIELD

## 2017-06-15 ENCOUNTER — Encounter: Payer: Self-pay | Admitting: Physician Assistant

## 2017-06-15 ENCOUNTER — Ambulatory Visit: Payer: Self-pay | Admitting: *Deleted

## 2017-06-15 ENCOUNTER — Other Ambulatory Visit: Payer: Self-pay

## 2017-06-15 VITALS — BP 182/84 | HR 60 | Temp 98.5°F | Resp 17 | Ht 60.24 in | Wt 230.2 lb

## 2017-06-15 DIAGNOSIS — R0602 Shortness of breath: Secondary | ICD-10-CM | POA: Diagnosis not present

## 2017-06-15 DIAGNOSIS — R601 Generalized edema: Secondary | ICD-10-CM

## 2017-06-15 MED ORDER — SPIRONOLACTONE 25 MG PO TABS: 25.0000 mg | ORAL_TABLET | Freq: Every day | ORAL | 0 refills | Status: DC

## 2017-06-15 NOTE — Patient Instructions (Addendum)
  Your labs and work up look great today.  Start the new diuretic. Do not take you potassium supplement with this new med.  Come back and see PA Weber in one week.      IF you received an x-ray today, you will receive an invoice from [facility]. Please contact [facility] at [phone] with questions or concerns regarding your invoice.   IF you received labwork today, you will receive an invoice from [facility]. Please contact [facility] at [phone] with questions or concerns regarding your invoice.   Our billing staff will not be able to assist you with questions regarding bills from these companies.  You will be contacted with the lab results as soon as they are available. The fastest way to get your results is to activate your My Chart account. Instructions are located on the last page of this paperwork. If you have not heard from [place] regarding the results in 2 weeks, please contact this office.

## 2017-06-15 NOTE — Telephone Encounter (Signed)
Pt called with symptoms from taking a new bp medication. She is having some dizziness, blurred vision and swelling in her lower extremities and fingers.  She also states some sweating and tingling in her hands.  She is denying chest pain, nausea or vomiting, or headache. Appointment made per protocol this am. Will route flow at [facility] at [facility].  Pt advised to call back with any worsening or increase in symptoms. Pt voiced understanding.  Reason for Disposition . [1] MODERATE leg swelling (e.g., swelling extends up to knees) AND [2] new onset or worsening  Answer Assessment - Initial Assessment Questions 1. SYMPTOMS: "Do you have any symptoms?"     Blurred vision, swelling in legs and feet, dizziness and also swollen fingers 2. SEVERITY: If symptoms are present, ask "Are they mild, moderate or severe?"     Moderate to severe  Answer Assessment - Initial Assessment Questions 1. ONSET: "When did the swelling start?" (e.g., minutes, hours, days)     On Sunday  2. LOCATION: "What part of the leg is swollen?"  "Are both legs swollen or just one leg?"     From the knee down to her feet 3. SEVERITY: "How bad is the swelling?" (e.g., localized; mild, moderate, severe)  - Localized - small area of swelling localized to one leg  - MILD pedal edema - swelling limited to foot and ankle, pitting edema < 1/4 inch (6 mm) deep, rest and elevation eliminate most or all swelling  - MODERATE edema - swelling of lower leg to knee, pitting edema > 1/4 inch (6 mm) deep, rest and elevation only partially reduce swelling  - SEVERE edema - swelling extends above knee, facial or hand swelling present      Feels tight 4. REDNESS: "Does the swelling look red or infected?"     no 5. PAIN: "Is the swelling painful to touch?" If so, ask: "How painful is it?"   (Scale 1-10; mild, moderate or severe)     Painful with pain #5 6. FEVER: "Do you have a fever?" If so, ask: "What is it, how was it measured, and when  did it start?"      no 7. CAUSE: "What do you think is causing the leg swelling?"     Medication related 8. MEDICAL HISTORY: "Do you have a history of heart failure, kidney disease, liver failure, or cancer?"     no 9. RECURRENT SYMPTOM: "Have you had leg swelling before?" If so, ask: "When was the last time?" "What happened that time?"     no 10. OTHER SYMPTOMS: "Do you have any other symptoms?" (e.g., chest pain, difficulty breathing)       no 11. PREGNANCY: "Is there any chance you are pregnant?" "When was your last menstrual period?"       no  Protocols used: LEG SWELLING AND EDEMA-A-AH, MEDICATION QUESTION CALL-A-AH

## 2017-06-15 NOTE — Progress Notes (Signed)
06/15/2017 1:19 PM   DOB: 05-01-1960 / MRN: [id number]  SUBJECTIVE:  Samantha Pearson is a 57 y.o. female presenting for swelling.  The swelling has been progressive over the last month.  She tells me she was recently stopped on chlorthalidone given history of gout.  He has been referred to rheumatology for further evaluation of gout.  She tells me that she has gained roughly 15 pounds since stopping the chlorthalidone.  She has no shortness of breath with physical activity but tells me that her feet hurt from the swelling.  He she is not complaining of hand pain, which is usually her main chief complaint.  She has no chest pain at this time.  She is allergic to doxycycline; norvasc [amlodipine besylate]; prednisone; septra [bactrim]; and latex.   She  has a past medical history of Allergy, Anxiety, Arthritis, Chest pain (08/03/08), Depression, Dyspnea, Edema, Heart murmur, Hypertension, and Obesity.    She  reports that she has never smoked. She has never used smokeless tobacco. She reports that she does not drink alcohol or use drugs. She  reports that she currently engages in sexual activity. She reports using the following method of birth control/protection: None. The patient  has a past surgical history that includes Cesarean section and Dilation and curettage of uterus.  Her family history includes Cancer (age of onset: 91) in her mother; Colon cancer in her maternal uncle; Colon polyps in her mother; Heart failure in her father; Hypertension in her brother, sister, sister, sister, and sister; Prostate cancer in her father.  Review of Systems  Constitutional: Negative for diaphoresis.  Eyes: Negative.   Respiratory: Negative for cough and shortness of breath.   Cardiovascular: Positive for leg swelling. Negative for chest pain.  Gastrointestinal: Negative for nausea.  Neurological: Negative for dizziness, sensory change, speech change, focal weakness and headaches.  Endo/Heme/Allergies:  Negative for polydipsia.   per HPI  The problem list and medications were reviewed and updated by myself where necessary and exist elsewhere in the encounter.   OBJECTIVE:  BP (!) 182/84 (BP Location: Right Arm, Patient Position: Sitting, Cuff Size: Large)   Pulse 60   Temp 98.5 F (36.9 C) (Oral)   Resp 17   Ht 5' 0.24" (1.53 m)   Wt 230 lb 3.2 oz (104.4 kg)   LMP 04/09/2014   SpO2 98%   BMI 44.60 kg/m   BP Readings from Last 3 Encounters:  06/15/17 (!) 182/84  06/05/17 138/62  04/23/17 124/66   CMP Latest Ref Rng & Units 06/05/2017 11/28/2016 06/12/2016  Glucose 65 - 99 mg/dL 96 86 98  BUN 6 - 24 mg/dL Creatinine 0.57 - 1.00 mg/dL 2.95 2.84 1.32  Sodium 134 - 144 mmol/L 140 142 140  Potassium 3.5 - 5.2 mmol/L 3.5 3.3(L) 4.0  Chloride 96 - 106 mmol/L 99 98 102  CO2 20 - 29 mmol/L Calcium 8.7 - 10.2 mg/dL 9.8 44.0 9.6  Total Protein 6.0 - 8.5 g/dL 7.5 7.9 7.1  Total Bilirubin 0.0 - 1.2 mg/dL 0.6 1.0 0.5  Alkaline Phos 39 - 117 IU/L 93 99 99  AST 0 - 40 IU/L ALT 0 - 32 IU/L Physical Exam  Constitutional: She is oriented to person, place, and time. She appears well-nourished. No distress.  Eyes: Pupils are equal, round, and reactive to light. EOM are normal.  Cardiovascular: Normal  rate, regular rhythm, S1 normal, S2 normal, normal heart sounds and intact distal pulses. Exam reveals no gallop, no friction rub and no decreased pulses.  No murmur heard. Pulmonary/Chest: Effort normal. No stridor. No respiratory distress. She has no wheezes. She has no rales.  Abdominal: She exhibits no distension.  Musculoskeletal: She exhibits no edema.  Neurological: She is alert and oriented to person, place, and time. She has normal strength and normal reflexes. She is not disoriented. She displays no atrophy. No cranial nerve deficit or sensory deficit. She exhibits normal muscle tone. Coordination and gait normal.  Skin: Skin is dry. She  is not diaphoretic.  Psychiatric: She has a normal mood and affect. [name].  Vitals reviewed.   No results found for this or any previous visit (from the past 72 hour(s)).  Dg Chest 2 View  Result Date: 06/15/2017 CLINICAL DATA:  Shortness of breath and LOWER extremity edema. EXAM: CHEST - 2 VIEW COMPARISON:  08/03/2008 radiographs FINDINGS: The cardiomediastinal silhouette is unremarkable. There is no evidence of focal airspace disease, pulmonary edema, suspicious pulmonary nodule/mass, pleural effusion, or pneumothorax. No acute bony abnormalities are identified. IMPRESSION: No active cardiopulmonary disease. Electronically Signed   By: Harmon Pier M.D.   On: 06/15/2017 12:57    ASSESSMENT AND PLAN:  Samantha Pearson was seen today for medication problem.  Diagnoses and all orders for this visit:  Generalized edema: She needs diuresis.  EKG and chest x-ray within normal limits.  I am checking a brain natruretic peptide however I think this will likely also be negative.  Starting her on Ada lactone.  I have had a conversation with her primary care provider PA Weber who would like to try the patient on a potassium sparing diuretic and see the patient back in the clinic in about 1 week.  Patient will establish that appointment today. -     DG Chest 2 View; Future -     EKG 12-Lead -     Brain natriuretic peptide -     CBC -     Basic Metabolic Panel -     spironolactone (ALDACTONE) 25 MG tablet; Take 1 tablet (25 mg total) by mouth daily. Do not take potassium supplementation with this medication.    The patient is advised to call or return to clinic if she does not see an improvement in symptoms, or to seek the care of the closest emergency department if she worsens with the above plan.   Deliah Boston, MHS, PA-C Primary Care at [facility] 06/15/2017 1:19 PM

## 2017-06-16 LAB — CBC
HEMATOCRIT: 35.7 % (ref 34.0–46.6)
HEMOGLOBIN: 10.9 g/dL — AB (ref 11.1–15.9)
MCH: 26.4 pg — ABNORMAL LOW (ref 26.6–33.0)
MCHC: 30.5 g/dL — AB (ref 31.5–35.7)
MCV: 86 fL (ref 79–97)
Platelets: 280 10*3/uL (ref 150–379)
RBC: 4.13 x10E6/uL (ref 3.77–5.28)
RDW: 14.4 % (ref 12.3–15.4)
WBC: 6.3 10*3/uL (ref 3.4–10.8)

## 2017-06-16 LAB — BASIC METABOLIC PANEL
BUN/Creatinine Ratio: 16 (ref 9–23)
BUN: 13 mg/dL (ref 6–24)
CALCIUM: 9.2 mg/dL (ref 8.7–10.2)
CO2: 24 mmol/L (ref 20–29)
CREATININE: 0.8 mg/dL (ref 0.57–1.00)
Chloride: 106 mmol/L (ref 96–106)
GFR calc Af Amer: 95 mL/min/{1.73_m2} (ref >59)
GFR calc non Af Amer: 83 mL/min/{1.73_m2} (ref >59)
Glucose: 81 mg/dL (ref 65–99)
Potassium: 3.9 mmol/L (ref 3.5–5.2)
SODIUM: 146 mmol/L — AB (ref 134–144)

## 2017-06-16 LAB — BRAIN NATRIURETIC PEPTIDE: BNP: 131 pg/mL — ABNORMAL HIGH (ref 0.0–100.0)

## 2017-06-18 ENCOUNTER — Other Ambulatory Visit: Payer: Self-pay | Admitting: Physician Assistant

## 2017-06-18 ENCOUNTER — Encounter: Payer: Self-pay | Admitting: Physician Assistant

## 2017-06-18 DIAGNOSIS — R601 Generalized edema: Secondary | ICD-10-CM

## 2017-06-18 DIAGNOSIS — R7989 Other specified abnormal findings of blood chemistry: Secondary | ICD-10-CM

## 2017-06-18 DIAGNOSIS — I10 Essential (primary) hypertension: Secondary | ICD-10-CM

## 2017-06-18 NOTE — Progress Notes (Signed)
Reviewed patient's labs from her visit last week - referral to cards made - BNP 1 year ago normal last week 131.  Needs evaluation -sent a message to patient to see if her swelling had improved.

## 2017-06-22 ENCOUNTER — Encounter: Payer: Self-pay | Admitting: Physician Assistant

## 2017-06-22 ENCOUNTER — Ambulatory Visit: Payer: BLUE CROSS/BLUE SHIELD | Admitting: Physician Assistant

## 2017-06-22 ENCOUNTER — Other Ambulatory Visit: Payer: Self-pay

## 2017-06-22 VITALS — BP 154/94 | HR 74 | Temp 98.3°F | Resp 16 | Ht <= 58 in | Wt 227.2 lb

## 2017-06-22 DIAGNOSIS — R7989 Other specified abnormal findings of blood chemistry: Secondary | ICD-10-CM

## 2017-06-22 DIAGNOSIS — R062 Wheezing: Secondary | ICD-10-CM

## 2017-06-22 DIAGNOSIS — R601 Generalized edema: Secondary | ICD-10-CM

## 2017-06-22 DIAGNOSIS — I10 Essential (primary) hypertension: Secondary | ICD-10-CM

## 2017-06-22 MED ORDER — CHLORTHALIDONE 25 MG PO TABS: 25.0000 mg | ORAL_TABLET | Freq: Every day | ORAL | 0 refills | Status: DC

## 2017-06-22 MED ORDER — IPRATROPIUM BROMIDE 0.02 % IN SOLN
0.5000 mg | Freq: Once | RESPIRATORY_TRACT | Status: AC
  Administered 2017-06-22: 0.5 mg via RESPIRATORY_TRACT

## 2017-06-22 MED ORDER — TRAMADOL HCL 50 MG PO TABS: 50.0000 mg | ORAL_TABLET | Freq: Two times a day (BID) | ORAL | 3 refills | Status: DC

## 2017-06-22 MED ORDER — ALBUTEROL SULFATE (2.5 MG/3ML) 0.083% IN NEBU
2.5000 mg | INHALATION_SOLUTION | Freq: Once | RESPIRATORY_TRACT | Status: AC
Start: 2017-06-22 — End: 2017-06-22
  Administered 2017-06-22: 2.5 mg via RESPIRATORY_TRACT

## 2017-06-22 NOTE — Patient Instructions (Addendum)
Referral to cardiology is pending at this time and they will call you with an appointment time in place.  For now when she does stop taking any meloxicam and stop taking any naproxen as this may make the heart issue that we are investigating worse.  For your blood pressure when she continue taking your Benicar, Spironolactone, and I am adding chlorthalidone 25 mg back to your daily regimen.  I want to see you back in 1 week for a lab recheck and so we can see how you are doing.  I have updated your med list to reflect everything that we talked about today.

## 2017-06-22 NOTE — Progress Notes (Signed)
06/22/2017 5:31 PM   DOB: [birth year] / MRN: [id number]  SUBJECTIVE:  Samantha Pearson is a 57 y.o. female presenting for evaluation of hypertension and swelling.  She is feeling somewhat better in regard to swelling since starting the potassium sparing diuretic.  She continues to feel swollen and bloated.  Her BNP came back elevated at 130.  She is been referred to cardiology and was unaware of this because she does not check her MyChart account.  Depression screen PHQ 2/9 06/22/2017  Decreased Interest 0  Down, Depressed, Hopeless 2  PHQ - 2 Score 2  Altered sleeping 3  Tired, decreased energy 3  Change in appetite 3  Feeling bad or failure about yourself  0  Trouble concentrating 0  Moving slowly or fidgety/restless 0  Suicidal thoughts 0  PHQ-9 Score 11  Difficult doing work/chores Not difficult at all  Some recent data might be hidden     She is allergic to doxycycline; norvasc [amlodipine besylate]; prednisone; septra [bactrim]; and latex.   She  has a past medical history of Allergy, Anxiety, Arthritis, Chest pain (08/03/08), Depression, Dyspnea, Edema, Heart murmur, Hypertension, and Obesity.    She  reports that she has never smoked. She has never used smokeless tobacco. She reports that she does not drink alcohol or use drugs. She  reports that she currently engages in sexual activity. She reports using the following method of birth control/protection: None. The patient  has a past surgical history that includes Cesarean section and Dilation and curettage of uterus.  Her family history includes Cancer (age of onset: 40) in her mother; Colon cancer in her maternal uncle; Colon polyps in her mother; Heart failure in her father; Hypertension in her brother, sister, sister, sister, and sister; Prostate cancer in her father.  Review of Systems  Constitutional: Negative for chills and fever.  Respiratory: Positive for wheezing. Negative for cough, hemoptysis, sputum production and  shortness of breath.   Cardiovascular: Negative for chest pain, palpitations, orthopnea, claudication, leg swelling and PND.  Gastrointestinal: Negative for nausea.  Skin: Negative for itching and rash.  Neurological: Negative for dizziness.    The problem list and medications were reviewed and updated by myself where necessary and exist elsewhere in the encounter.   OBJECTIVE:  BP (!) 154/94 (BP Location: Left Arm, Patient Position: Sitting, Cuff Size: Large)   Pulse 74   Temp 98.3 F (36.8 C) (Oral)   Resp 16   Ht  (1.346 m)   Wt 227 lb 3.2 oz (103.1 kg)   LMP 04/09/2014   SpO2 98%   BMI 56.87 kg/m   Wt Readings from Last 3 Encounters:  06/22/17 227 lb 3.2 oz (103.1 kg)  06/15/17 230 lb 3.2 oz (104.4 kg)  06/05/17 217 lb (98.4 kg)   BP Readings from Last 3 Encounters:  06/22/17 (!) 154/94  06/15/17 (!) 182/84  06/05/17 138/62   Physical Exam  Constitutional: She is oriented to person, place, and time. She appears well-nourished. No distress.  HENT:  Mouth/Throat: Uvula is midline, oropharynx is clear and moist and mucous membranes are normal.  Eyes: Pupils are equal, round, and reactive to light. EOM are normal.  Cardiovascular: Normal rate, regular rhythm, S1 normal, S2 normal, normal heart sounds and intact distal pulses. Exam reveals no gallop, no friction rub and no decreased pulses.  No murmur heard. Pulmonary/Chest: Effort normal. No stridor. No respiratory distress. She has wheezes. She has no rales (bronchus).  Abdominal: She  exhibits no distension.  Musculoskeletal: She exhibits no edema.  Neurological: She is alert and oriented to person, place, and time. No cranial nerve deficit. Gait normal.  Skin: Skin is dry. She is not diaphoretic.  Psychiatric: She has a normal mood and affect.  Vitals reviewed.   CMP Latest Ref Rng & Units 06/15/2017 06/05/2017 11/28/2016  Glucose 65 - 99 mg/dL 81 96 86  BUN 6 - 24 mg/dL Creatinine 0.57 - 1.00 mg/dL  1.61 0.96 0.45  Sodium 134 - 144 mmol/L 146(H) 140 142  Potassium 3.5 - 5.2 mmol/L 3.9 3.5 3.3(L)  Chloride 96 - 106 mmol/L 106 99 98  CO2 20 - 29 mmol/L Calcium 8.7 - 10.2 mg/dL 9.2 9.8 40.9  Total Protein 6.0 - 8.5 g/dL - 7.5 7.9  Total Bilirubin 0.0 - 1.2 mg/dL - 0.6 1.0  Alkaline Phos 39 - 117 IU/L - 93 99  AST 0 - 40 IU/L - 23 23  ALT 0 - 32 IU/L - 20 20     No results found for this or any previous visit (from the past 72 hour(s)).  No results found.  ASSESSMENT AND PLAN:  Samantha Pearson was seen today for bp.  Diagnoses and all orders for this visit:  Elevated brain natriuretic peptide (BNP) level referral to cards pending.  She remains 10 pounds volume up from her dry weight of 217.  I am adding her chlorthalidone 25 mg and having her continue the Spironolactone and olmesartan.  I will see her back in about 1 week for recheck.  She is been off of potassium since her last visit.  We will check a renal panel today mildly about the level of hypokalemia will likely have her and her potassium back given the addition of chlorthalidone. -     Brain natriuretic peptide -     Renal Function Panel -     chlorthalidone (HYGROTON) 25 MG tablet; Take 1 tablet (25 mg total) by mouth daily. -     traMADol (ULTRAM) 50 MG tablet; Take 1 tablet (50 mg total) by mouth 2 (two) times daily.  Uncontrolled hypertension -     chlorthalidone (HYGROTON) 25 MG tablet; Take 1 tablet (25 mg total) by mouth daily.  Wheezing -     albuterol (PROVENTIL) (2.5 MG/3ML) 0.083% nebulizer solution 2.5 mg -     ipratropium (ATROVENT) nebulizer solution 0.5 mg  Generalized edema -     chlorthalidone (HYGROTON) 25 MG tablet; Take 1 tablet (25 mg total) by mouth daily.    The patient is advised to call or return to clinic if she does not see an improvement in symptoms, or to seek the care of the closest emergency department if she worsens with the above plan.   Deliah Boston, MHS, PA-C Primary Care at  [facility] 06/22/2017 5:31 PM

## 2017-06-23 LAB — RENAL FUNCTION PANEL
ALBUMIN: 4.3 g/dL (ref 3.5–5.5)
BUN/Creatinine Ratio: 18 (ref 9–23)
BUN: 16 mg/dL (ref 6–24)
CALCIUM: 9.7 mg/dL (ref 8.7–10.2)
CHLORIDE: 104 mmol/L (ref 96–106)
CO2: 22 mmol/L (ref 20–29)
Creatinine, Ser: 0.91 mg/dL (ref 0.57–1.00)
GFR calc non Af Amer: 71 mL/min/{1.73_m2} (ref >59)
GFR, EST AFRICAN AMERICAN: 82 mL/min/{1.73_m2} (ref >59)
Glucose: 89 mg/dL (ref 65–99)
Phosphorus: 3.6 mg/dL (ref 2.5–4.5)
Potassium: 3.7 mmol/L (ref 3.5–5.2)
Sodium: 143 mmol/L (ref 134–144)

## 2017-06-27 ENCOUNTER — Telehealth: Payer: Self-pay | Admitting: Physician Assistant

## 2017-06-27 LAB — SPECIMEN STATUS REPORT

## 2017-06-27 NOTE — Telephone Encounter (Signed)
Lab informed.

## 2017-06-27 NOTE — Telephone Encounter (Signed)
Copied from CRM [id number]. Topic: Quick Communication - See Telephone Encounter >> Jun 27, 2017  9:29 AM Jolayne Haines L wrote: CRM for notification. See Telephone encounter for: 06/27/17.  Vicky from [employer] needs a call back about a frozen specimen. She needs some clarification of what is actually in the tube that was sent to them Call back is [phone] option 1. Ext [phone] Archie Patten

## 2017-06-29 ENCOUNTER — Encounter: Payer: Self-pay | Admitting: Physician Assistant

## 2017-06-29 ENCOUNTER — Other Ambulatory Visit: Payer: Self-pay

## 2017-06-29 ENCOUNTER — Ambulatory Visit (INDEPENDENT_AMBULATORY_CARE_PROVIDER_SITE_OTHER): Payer: BLUE CROSS/BLUE SHIELD | Admitting: Physician Assistant

## 2017-06-29 VITALS — BP 130/70 | HR 94 | Temp 98.6°F | Ht <= 58 in | Wt 213.8 lb

## 2017-06-29 DIAGNOSIS — R7989 Other specified abnormal findings of blood chemistry: Secondary | ICD-10-CM | POA: Diagnosis not present

## 2017-06-29 DIAGNOSIS — I159 Secondary hypertension, unspecified: Secondary | ICD-10-CM | POA: Diagnosis not present

## 2017-06-29 LAB — BRAIN NATRIURETIC PEPTIDE: BNP: 72.2 pg/mL (ref 0.0–100.0)

## 2017-06-29 NOTE — Progress Notes (Signed)
07/01/2017 8:18 AM   DOB: [birth year] / MRN: [id number]  SUBJECTIVE:  Samantha Pearson is a 57 y.o. female presenting for follow up BP and swelling.  She is back to her dry weight.  BNP normalized.  I placed her back on 25 of chlorthalidone and she is also takes K-sparing diuretic as well as ACE/ARB. She feels better.  No SOB or chest pain.   She is allergic to doxycycline; norvasc [amlodipine besylate]; prednisone; septra [bactrim]; and latex.   She  has a past medical history of Allergy, Anxiety, Arthritis, Chest pain (08/03/08), Depression, Dyspnea, Edema, Heart murmur, Hypertension, and Obesity.    She  reports that she has never smoked. She has never used smokeless tobacco. She reports that she does not drink alcohol or use drugs. She  reports that she currently engages in sexual activity. She reports using the following method of birth control/protection: None. The patient  has a past surgical history that includes Cesarean section and Dilation and curettage of uterus.  Her family history includes Cancer (age of onset: 26) in her mother; Colon cancer in her maternal uncle; Colon polyps in her mother; Heart failure in her father; Hypertension in her brother, sister, sister, sister, and sister; Prostate cancer in her father.  Review of Systems  Constitutional: Negative for chills, diaphoresis and fever.  Eyes: Negative.   Respiratory: Negative for cough, hemoptysis, sputum production, shortness of breath and wheezing.   Cardiovascular: Negative for chest pain, orthopnea and leg swelling.  Gastrointestinal: Negative for abdominal pain, blood in stool, constipation, diarrhea, heartburn, melena, nausea and vomiting.  Genitourinary: Negative for dysuria, flank pain, frequency, hematuria and urgency.  Skin: Negative for rash.  Neurological: Negative for dizziness, sensory change, speech change, focal weakness and headaches.    The problem list and medications were reviewed and updated by myself  where necessary and exist elsewhere in the encounter.   OBJECTIVE:  BP 130/70 (BP Location: Left Arm, Patient Position: Sitting, Cuff Size: Large)   Pulse 94   Temp 98.6 F (37 C) (Oral)   Ht  (1.346 m)   Wt 213 lb 12.8 oz (97 kg)   LMP 04/09/2014   SpO2 97%   BMI 53.51 kg/m   Wt Readings from Last 3 Encounters:  06/29/17 213 lb 12.8 oz (97 kg)  06/22/17 227 lb 3.2 oz (103.1 kg)  06/15/17 230 lb 3.2 oz (104.4 kg)   Lab Results  Component Value Date   K 3.9 06/29/2017      Physical Exam  Constitutional: She is oriented to person, place, and time. [name]. No distress.  Eyes: Pupils are equal, round, and reactive to light. EOM are normal.  Cardiovascular: Normal rate, regular rhythm, S1 normal, S2 normal, normal heart sounds and intact distal pulses. Exam reveals no gallop, no friction rub and no decreased pulses.  No murmur heard. Pulmonary/Chest: Effort normal and breath sounds normal. No stridor. No respiratory distress. She has no wheezes. She has no rales.  Abdominal: She exhibits no distension.  Musculoskeletal: Normal range of motion. She exhibits no edema.  Neurological: She is alert and oriented to person, place, and time. No cranial nerve deficit. Gait normal.  Skin: Skin is warm and dry. She is not diaphoretic.  Psychiatric: She has a normal mood and affect.  Vitals reviewed.   Results for orders placed or performed in visit on 06/29/17 (from the past 72 hour(s))  Potassium     Status: None  Collection Time: 06/29/17  5:27 PM  Result Value Ref Range   Potassium 3.9 3.5 - 5.2 mmol/L    No results found.  ASSESSMENT AND PLAN:  Samantha Pearson was seen today for follow-up.  Diagnoses and all orders for this visit:  Elevated brain natriuretic peptide (BNP) level: There is some component of insuficiency, probably 2/2 to a long history of murmur.  Cards referral pending.  She needs a echo. Medically well managed at this  point and she will see PA Weber back in the next week or so.    Secondary hypertension -     Potassium    The patient is advised to call or return to clinic if she does not see an improvement in symptoms, or to seek the care of the closest emergency department if she worsens with the above plan.   Deliah Boston, MHS, PA-C Primary Care at [facility] 07/01/2017 8:18 AM

## 2017-06-30 LAB — POTASSIUM: POTASSIUM: 3.9 mmol/L (ref 3.5–5.2)

## 2017-07-05 ENCOUNTER — Other Ambulatory Visit: Payer: Self-pay

## 2017-07-05 ENCOUNTER — Ambulatory Visit: Payer: BLUE CROSS/BLUE SHIELD | Admitting: Physician Assistant

## 2017-07-05 ENCOUNTER — Encounter: Payer: Self-pay | Admitting: Physician Assistant

## 2017-07-05 VITALS — BP 126/74 | HR 87 | Temp 99.5°F | Ht 59.06 in | Wt 209.0 lb

## 2017-07-05 DIAGNOSIS — R601 Generalized edema: Secondary | ICD-10-CM

## 2017-07-05 DIAGNOSIS — I10 Essential (primary) hypertension: Secondary | ICD-10-CM | POA: Diagnosis not present

## 2017-07-05 DIAGNOSIS — M79642 Pain in left hand: Secondary | ICD-10-CM | POA: Diagnosis not present

## 2017-07-05 DIAGNOSIS — M79641 Pain in right hand: Secondary | ICD-10-CM

## 2017-07-05 DIAGNOSIS — M7989 Other specified soft tissue disorders: Secondary | ICD-10-CM

## 2017-07-05 DIAGNOSIS — E790 Hyperuricemia without signs of inflammatory arthritis and tophaceous disease: Secondary | ICD-10-CM

## 2017-07-05 DIAGNOSIS — R7989 Other specified abnormal findings of blood chemistry: Secondary | ICD-10-CM | POA: Diagnosis not present

## 2017-07-05 MED ORDER — SPIRONOLACTONE 50 MG PO TABS: 50.0000 mg | ORAL_TABLET | Freq: Every day | ORAL | 0 refills | Status: DC

## 2017-07-05 MED ORDER — OLMESARTAN MEDOXOMIL 40 MG PO TABS: 40.0000 mg | ORAL_TABLET | Freq: Every day | ORAL | 0 refills | Status: DC

## 2017-07-05 NOTE — Patient Instructions (Addendum)
  Stop chlorthalidone  Increase spironolactone - to  - it is ok to take  2 pills that you have at home.  See you in 2 weeks   IF you received an x-ray today, you will receive an invoice from [facility]. Please contact [facility] at [phone] with questions or concerns regarding your invoice.   IF you received labwork today, you will receive an invoice from [place]. Please contact [facility] at [phone] with questions or concerns regarding your invoice.   Our billing staff will not be able to assist you with questions regarding bills from these companies.  You will be contacted with the lab results as soon as they are available. The fastest way to get your results is to activate your My Chart account. Instructions are located on the last page of this paperwork. If you have not heard from [place] regarding the results in 2 weeks, please contact this office.

## 2017-07-05 NOTE — Progress Notes (Signed)
Samantha Pearson  MRN: [id number] DOB: 11-30-1960  PCP: Samantha Riddle, PA-C  Chief Complaint  Patient presents with  . Follow-up    follow up on weight loss and bp    Subjective:  Pt presents to clinic for follow-up of blood pressure, finger hand pain, and swelling.    She is checking her BP at home and it has been better controlled since changing to benicar.  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  Since her visit on 5/17 and 5/24 - she has changed her diet - decrease fast foods, lower meat meals and more egg whites - eating high veggies and fruit without seasoning and drinking water - she has felt a change in her weight and feels better and has more energy.  appt 6/5 - appt with rheumatology for daily finger pain.  About a year ago she had an elevated uric acid -was treated for gout and at the time cellulitis treatment resolved her problem.  Since then she has had this chronic finger pain and at her visit in April her uric acid was significantly elevated.  She continues to have finger pain which is limiting her daily activities and causing pain.   She has not heard about cardiology visit.  History is obtained by patient.  Review of Systems  Respiratory: Negative for cough and shortness of breath (resolved).   Cardiovascular: Negative for chest pain and leg swelling (resolved).  Musculoskeletal: Positive for arthralgias (fingers - continues).    Patient Active Problem List   Diagnosis Date Noted  . Left knee pain 03/14/2016  . Class 3 obesity due to excess calories with serious comorbidity and body mass index (BMI) of 40.0 to 44.9 in adult 08/18/2013  . Stress 05/22/2011  . ESSENTIAL HYPERTENSION, BENIGN 08/07/2008    Current Outpatient Medications on File Prior to Visit  Medication Sig  Dispense Refill  . cetirizine (ZYRTEC) 10 MG tablet Take 10 mg by mouth daily.    . Glucosamine-Chondroitin (GLUCOSAMINE CHONDR COMPLEX PO) Take by mouth daily.    . Multiple Vitamin (MULTIVITAMIN) tablet Take 1 tablet by mouth daily.    Marland Kitchen OVER THE COUNTER MEDICATION OTC Vitamin  B50 500 mg taking daily    . traMADol (ULTRAM) 50 MG tablet Take 1 tablet (50 mg total) by mouth 2 (two) times daily. 30 tablet 3  . Turmeric POWD 1,950 mg by Does not apply route 3 (three) times daily.     No current facility-administered medications on file prior to visit.     Allergies  Allergen Reactions  . Doxycycline Hives, Itching and Swelling  . Norvasc [Amlodipine Besylate] Swelling    Feet swelling  . Prednisone Swelling  . Septra [Bactrim] Hives, Itching and Swelling  . Latex Swelling and Rash    Past Medical History:  Diagnosis Date  . Allergy   . Anxiety   . Arthritis   . Chest pain 08/03/08   Urgent care visit  . Depression   . Dyspnea   . Edema   . Heart murmur   . Hypertension   . 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 Smoking status: Never Smoker  . Smokeless tobacco: Never Used  . Tobacco comment: Nonsmoker  Substance Use Topics  . Alcohol use: No    Alcohol/week: 0.0 oz  . Drug use: No   family history includes Cancer (age of onset: 83) in her mother; Colon cancer in her maternal uncle; Colon polyps in her mother; Heart failure in her father; Hypertension in her brother, sister, sister, sister, and sister; Prostate cancer in her father.       Objective:  BP 126/74 (BP Location: Left Arm, Patient Position: Sitting, Cuff Size: Normal)   Pulse 87   Temp 99.5 F (37.5 C) (Oral)   Ht 4' 11.06" (1.5 m)   Wt 209 lb (94.8 kg)   LMP 04/09/2014   SpO2 98%   BMI 42.13 kg/m  Body mass index is 42.13 kg/m.  Physical Exam  Constitutional: She is oriented to person, place, and time. [name].  HENT:  Head: Normocephalic and atraumatic.  Right Ear: Hearing and external ear normal.  Left Ear: Hearing and external ear normal.  Eyes: Conjunctivae are normal.  Neck: Normal range of motion.  Cardiovascular: Normal rate, regular rhythm and normal heart sounds.  No murmur heard. Pulmonary/Chest: Effort normal and breath sounds normal.  Musculoskeletal:       Right lower leg: She exhibits no edema.       Left lower leg: She exhibits no edema.  1st 3 digits of both hands swollen esp at the DIP with mild erythema  Neurological: She is alert and oriented to person, place, and time.  Skin: Skin is warm and dry.  Psychiatric: She has a normal mood and affect. [name]. Judgment and thought content normal.  Vitals reviewed.   Assessment and Plan :  Essential hypertension - Plan: olmesartan (BENICAR) 40 MG tablet - well controlled  Generalized edema - Plan: spironolactone (ALDACTONE) 50 MG tablet - increase this medication because I want her off chlorthalidone due to its potential of increasing uric acid  Elevated brain natriuretic peptide (BNP) level - improved since swelling has decreased - needs echo and has a referral into cardiology that was done last week and we are waiting to hear about that appt  Finger swelling  Bilateral hand pain  Elevated uric acid in blood - suspect gout - ? Chronic gout - she has an appt in 2 weeks with rheumatology so we will wait for uric acid decreasing agents until that appt  Warning signs of swelling and SOB d/w pt and she is aware of when she needs  to be evaluated urgently.    Patient verbalized to me that she understood the following: their diagnosis, what is being done for it, what to expect and what should be done at home.  See after visit summary for patient specific instructions.  She will see me in 2 weeks for recheck and discussion of what specialist determined.  Benny Lennert PA-C  Primary Care at [facility] 07/05/2017 12:55 PM

## 2017-07-11 DIAGNOSIS — M19071 Primary osteoarthritis, right ankle and foot: Secondary | ICD-10-CM | POA: Diagnosis not present

## 2017-07-11 DIAGNOSIS — M109 Gout, unspecified: Secondary | ICD-10-CM | POA: Diagnosis not present

## 2017-07-11 DIAGNOSIS — M130 Polyarthritis, unspecified: Secondary | ICD-10-CM | POA: Diagnosis not present

## 2017-07-11 DIAGNOSIS — M19072 Primary osteoarthritis, left ankle and foot: Secondary | ICD-10-CM | POA: Diagnosis not present

## 2017-07-11 DIAGNOSIS — E669 Obesity, unspecified: Secondary | ICD-10-CM | POA: Diagnosis not present

## 2017-07-11 DIAGNOSIS — I10 Essential (primary) hypertension: Secondary | ICD-10-CM | POA: Diagnosis not present

## 2017-07-11 DIAGNOSIS — M170 Bilateral primary osteoarthritis of knee: Secondary | ICD-10-CM | POA: Diagnosis not present

## 2017-07-12 ENCOUNTER — Encounter: Payer: Self-pay | Admitting: Physician Assistant

## 2017-07-12 ENCOUNTER — Telehealth: Payer: Self-pay | Admitting: Physician Assistant

## 2017-07-12 NOTE — Telephone Encounter (Signed)
Call and spoke with Dr Kathi LudwigSyed - she would like to put the patient on prednisone for her gout - she wants to make sure her BP will be ok and that I am willing to monitor her closely.  Pt will make an appt with me after being on the prednisone for several days.

## 2017-07-12 NOTE — Telephone Encounter (Signed)
Copied from CRM [phone]. Topic: Quick Communication - See Telephone Encounter >> Jul 12, 2017 10:20 AM Maia Pettiesrtiz, Kristie S wrote: CRM for notification. See Telephone encounter for: 07/12/17. [facility] speak to Benny LennertSarah Weber, PA about the pt (Rheumatology). Please call back Dr. Kathi LudwigSyed on her cell phone  [phone].

## 2017-07-12 NOTE — Telephone Encounter (Signed)
Please advise 

## 2017-07-13 ENCOUNTER — Ambulatory Visit: Payer: BLUE CROSS/BLUE SHIELD | Admitting: Physician Assistant

## 2017-07-13 ENCOUNTER — Encounter: Payer: Self-pay | Admitting: Physician Assistant

## 2017-07-13 ENCOUNTER — Other Ambulatory Visit: Payer: Self-pay

## 2017-07-13 VITALS — BP 120/82 | HR 93 | Temp 98.9°F | Resp 18 | Ht 59.06 in | Wt 209.8 lb

## 2017-07-13 DIAGNOSIS — M1A09X0 Idiopathic chronic gout, multiple sites, without tophus (tophi): Secondary | ICD-10-CM

## 2017-07-13 DIAGNOSIS — I10 Essential (primary) hypertension: Secondary | ICD-10-CM

## 2017-07-13 DIAGNOSIS — R601 Generalized edema: Secondary | ICD-10-CM

## 2017-07-13 NOTE — Patient Instructions (Signed)
     IF you received an x-ray today, you will receive an invoice from [facility]. Please contact [facility] at [phone] with questions or concerns regarding your invoice.   IF you received labwork today, you will receive an invoice from [facility]. Please contact [facility] at [phone] with questions or concerns regarding your invoice.   Our billing staff will not be able to assist you with questions regarding bills from these companies.  You will be contacted with the lab results as soon as they are available. The fastest way to get your results is to activate your My Chart account. Instructions are located on the last page of this paperwork. If you have not heard from us regarding the results in 2 weeks, please contact this office.     

## 2017-07-13 NOTE — Progress Notes (Signed)
Samantha Pearson  MRN: [id number] DOB: 02-06-1961  PCP: Morrell Riddle, PA-C  Chief Complaint  Patient presents with  . Medication Problem    wants to discuss taking prednisone due to it on her allergy list and it effecting her BP     Subjective:  Pt presents to clinic for discussion of visit with rheumatologist that she had earlier this week.  She has been diagnosed with gout and colchicine was started for an acute flare, but prednisone is preferred due to her level of gout.  Once the current player of gout is controlled then the plan is to start allopurinol.  She discussed this with Dr. Nolen Mu and want to discuss this with me before she started it.  She has been checking her blood pressure at home most of her readings have been 150s over low to mid 90s.  She is very stressed out over all of this.  She has been suggested to go out of work for 2 weeks during treatment of her acute gout flare, which is stressed her out.  She has talked to HR and HR has agreed that she needs to be out of work to allow herself to heal.  She has not yet started spironolactone 50mg  - she is taking 25mg  daily  Spoke with Dr. Kathi Ludwig yesterday, see note regarding this phone call.  History is obtained by patient.  Review of Systems  Musculoskeletal: Positive for arthralgias (new diagnosis of gout).    Patient Active Problem List   Diagnosis Date Noted  . Left knee pain 03/14/2016  . Class 3 obesity due to excess calories with serious comorbidity and body mass index (BMI) of 40.0 to 44.9 in adult 08/18/2013  . Stress 05/22/2011  . ESSENTIAL HYPERTENSION, BENIGN 08/07/2008    Current Outpatient Medications on File Prior to Visit  Medication Sig Dispense Refill  . cetirizine (ZYRTEC) 10 MG tablet Take 10 mg by mouth daily.    . colchicine 0.6 MG tablet Take 0.6 mg by mouth 2 (two) times daily.    . Glucosamine-Chondroitin (GLUCOSAMINE CHONDR COMPLEX PO) Take by mouth daily.    . Multiple Vitamin (MULTIVITAMIN)  tablet Take 1 tablet by mouth daily.    Marland Kitchen olmesartan (BENICAR) 40 MG tablet Take 1 tablet (40 mg total) by mouth daily. 90 tablet 0  . OVER THE COUNTER MEDICATION OTC Vitamin  B50 500 mg taking daily    . spironolactone (ALDACTONE) 50 MG tablet Take 1 tablet (50 mg total) by mouth daily. Do not take potassium supplementation with this medication. 30 tablet 0  . traMADol (ULTRAM) 50 MG tablet Take 1 tablet (50 mg total) by mouth 2 (two) times daily. 30 tablet 3  . Turmeric POWD 1,950 mg by Does not apply route 3 (three) times daily.     No current facility-administered medications on file prior to visit.     Allergies  Allergen Reactions  . Doxycycline Hives, Itching and Swelling  . Norvasc [Amlodipine Besylate] Swelling    Feet swelling  . Prednisone Swelling    Pt was having controlled BP at the time and her fluid pill was also stopped - do no believe this is a true allergy  . Septra [Bactrim] Hives, Itching and Swelling  . Latex Swelling and Rash    Past Medical History:  Diagnosis Date  . Allergy   . Anxiety   . Arthritis   . Chest pain 08/03/08   Urgent care visit  . Depression   .  Dyspnea   . Edema   . Heart murmur   . Hypertension   . 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 Smoking status: Never Smoker  . Smokeless tobacco: Never Used  . Tobacco comment: Nonsmoker  Substance Use Topics  . Alcohol use: No    Alcohol/week: 0.0 oz  . Drug use: No   family history includes Cancer (age of onset: 6671) in her mother; Colon cancer in her maternal  uncle; Colon polyps in her mother; Heart failure in her father; Hypertension in her brother, sister, sister, sister, and sister; Prostate cancer in her father.     Objective:  BP 120/82   Pulse 93   Temp 98.9 F (37.2 C) (Oral)   Resp 18   Ht 4' 11.06" (1.5 m)   Wt 209 lb 12.8 oz (95.2 kg)   LMP 04/09/2014   SpO2 98%   BMI 42.29 kg/m  Body mass index is 42.29 kg/m.  Wt Readings from Last 3 Encounters:  07/13/17 209 lb 12.8 oz (95.2 kg)  07/05/17 209 lb (94.8 kg)  06/29/17 213 lb 12.8 oz (97 kg)    Physical Exam  Constitutional: She is oriented to person, place, and time. [name].  HENT:  Head: Normocephalic and atraumatic.  Right Ear: Hearing and external ear normal.  Left Ear: Hearing and external ear normal.  Eyes: Conjunctivae are normal.  Neck: Normal range of motion.  Cardiovascular: Normal rate, regular rhythm and normal heart sounds.  No murmur heard. Pulmonary/Chest: Effort normal and breath sounds normal. She has no wheezes.  Musculoskeletal:  Fingers swollen and mildly erythematous  Neurological: She is alert and oriented to person, place, and time.  Skin: Skin is warm and dry.  Psychiatric: Judgment normal.  Vitals reviewed.   Assessment and Plan :  Essential hypertension - reading today is good compared with home readings.  She will recheck with me and she will bring her BP cuff with her.  She will continue to watch her salt intake and continue her lifestyle changes which she has been doing over the last several weeks.  Generalized edema -plan has been to increase decrease Spironolactone which should continue to help her swelling she will monitor this with the prednisone.  Prednisone with increased edema is on her allergy list though when reviewing her chart I can find nothing to document when this happened.  She was last on prednisone approximately a year ago for her first gout attack.    Idiopathic chronic gout of  multiple sites without tophus -rheumatologist wants to start prednisone.  Patient is concerned about that we talked about this at length.  She needs a short dose of prednisone to allow her gout attack to calm down then to allow the allopurinol to be started.  She will recheck with me 2 to 3 days after starting prednisone this will allow us to check her  weight, check her blood pressure, as well as check her swelling.  Determining the results of her recheck will determine whether she should continue the prednisone or not at that time.  I have talked to her the rheumatologist and she is in agreement meant that we will work on this together due to past potential side effects of this medication.  Patient's questions were answered and she agrees with the above plan   Benny Lennert PA-C  Primary Care at [facility] 07/13/2017 5:58 PM

## 2017-07-21 ENCOUNTER — Other Ambulatory Visit: Payer: Self-pay

## 2017-07-21 ENCOUNTER — Ambulatory Visit (INDEPENDENT_AMBULATORY_CARE_PROVIDER_SITE_OTHER): Payer: BLUE CROSS/BLUE SHIELD | Admitting: Physician Assistant

## 2017-07-21 ENCOUNTER — Encounter: Payer: Self-pay | Admitting: Physician Assistant

## 2017-07-21 VITALS — BP 155/80 | HR 71 | Temp 99.0°F | Resp 18 | Ht 59.06 in | Wt 216.0 lb

## 2017-07-21 DIAGNOSIS — I10 Essential (primary) hypertension: Secondary | ICD-10-CM | POA: Diagnosis not present

## 2017-07-21 DIAGNOSIS — M1A09X1 Idiopathic chronic gout, multiple sites, with tophus (tophi): Secondary | ICD-10-CM | POA: Diagnosis not present

## 2017-07-21 NOTE — Patient Instructions (Signed)
     IF you received an x-ray today, you will receive an invoice from [facility]. Please contact [facility] at [phone] with questions or concerns regarding your invoice.   IF you received labwork today, you will receive an invoice from [facility]. Please contact [facility] at [phone] with questions or concerns regarding your invoice.   Our billing staff will not be able to assist you with questions regarding bills from these companies.  You will be contacted with the lab results as soon as they are available. The fastest way to get your results is to activate your My Chart account. Instructions are located on the last page of this paperwork. If you have not heard from us regarding the results in 2 weeks, please contact this office.     

## 2017-07-21 NOTE — Progress Notes (Signed)
Subjective:    Patient ID: Samantha RanchJanice M Pearson, female    DOB: 11-13-1960, 57 y.o.   MRN: [id number]  Daryll DrownJanice Hippler is a 57 year old female presenting for follow-up on blood pressure. She has been out of work for 1 out of 2 weeks per rheumatology (Dr. Nolen MuSeyd) for gout exacerbation. She was previously making significant dietary improvements (increasing fruits, vegetables, salads, water intake), but since being at home for the past week has been sedentary and eating more meat (mostly baked or boiled chicken). Gout believed to also be in her feet, so restriciton of activity prescribed. Patient has gained 7 lbs since 07/13/17 visit. She notes concern that taking two 25mg  tablets together instead of 1 in am and 1 in pm is contributing to weight gain.   She notes that her hands (esp. Left) are still tingling and swelling and have accompanying itchiness.   She is currently tolerating her prednisone well, denying insomnia or recent infection/illness.  She believes her ankles have been slightly swollen but also states that "to me it's always been that way a little bit."   Review of Systems  Constitutional: Positive for activity change (off work), fatigue (since being off work) and unexpected weight change. Negative for chills and fever.  Respiratory: Negative for chest tightness and shortness of breath.   Cardiovascular: Positive for palpitations (intermittent - attributes to heart murmur, and anxiety). Negative for chest pain.  Gastrointestinal: Positive for diarrhea (cochicine). Negative for blood in stool, constipation and vomiting.  Genitourinary: Positive for frequency (drinking more water with colchicine/spironolactone). Negative for difficulty urinating, dysuria and hematuria.  Musculoskeletal: Positive for arthralgias (hands) and joint swelling (fingers, esp. left).  Neurological: Positive for numbness (fingers) and headaches (describes as tension). Negative for dizziness and light-headedness.    Psychiatric/Behavioral: The patient is nervous/anxious.        Objective:   Physical Exam  Constitutional: [name]. No distress.  HENT:  Head: Normocephalic and atraumatic.  Eyes: Pupils are equal, round, and reactive to light.  Neck: No thyromegaly present.  Cardiovascular: Normal rate and regular rhythm. Exam reveals no gallop and no friction rub.  No murmur heard. H/o murmur  Pulmonary/Chest: Effort normal and breath sounds normal. No stridor. She has no wheezes. She has no rales.  Musculoskeletal:  Fingers swollen as usual for patient  Lymphadenopathy:    She has no cervical adenopathy.  Skin: Skin is warm and dry.     Psychiatric: She has a normal mood and affect. [name]. Judgment and thought content normal.  Vitals reviewed.   Vitals:   07/21/17 1442  BP: (!) 155/80  Pulse: 71  Resp: 18  Temp: 99 F (37.2 C)  SpO2: 98%    Wt Readings from Last 3 Encounters:  07/21/17 216 lb (98 kg)  07/13/17 209 lb 12.8 oz (95.2 kg)  07/05/17 209 lb (94.8 kg)        Assessment & Plan:  Essential Hypertension Continue prednisone and colchicine regimen as directed by rheumatology. We will re-evaluate blood pressure at next visit on 07/27/17, at which time she will be on the 10mg  portion of the prednisone taper dose. Current office readings are elevated but in the setting of 20-30mg  prednisone.  Okay to increase activity to include walking on the treadmill to incorporate moderate exercise into time off from work and move towards restarting healthy lifestyle. Patient has upcoming follow up appointments with rheumatology and cardiology, so blood pressure will also be monitored from these  visits.   Gout - continue prednisone   Benny Lennert PA-C  Primary Care at [facility] 07/23/2017 9:25 PM

## 2017-07-23 ENCOUNTER — Encounter: Payer: Self-pay | Admitting: Physician Assistant

## 2017-07-23 DIAGNOSIS — M1A09X1 Idiopathic chronic gout, multiple sites, with tophus (tophi): Secondary | ICD-10-CM | POA: Insufficient documentation

## 2017-07-26 ENCOUNTER — Telehealth: Payer: Self-pay | Admitting: Physician Assistant

## 2017-07-26 NOTE — Telephone Encounter (Signed)
Copied from CRM [id number]. Topic: Quick Communication - See Telephone Encounter >> Jul 26, 2017  1:03 PM Cipriano BunkerLambe, Annette S wrote: CRM for notification. See Telephone encounter for: 07/26/17.  [place] [employer] [phone] claim # [id number] regards to Short Term Disability,  confirm with dr. When the disability of pt. started

## 2017-07-26 NOTE — Telephone Encounter (Signed)
Message sent to [facility] [employer] req info on [employer]

## 2017-07-27 ENCOUNTER — Other Ambulatory Visit: Payer: Self-pay

## 2017-07-27 ENCOUNTER — Ambulatory Visit: Payer: BLUE CROSS/BLUE SHIELD | Admitting: Physician Assistant

## 2017-07-27 ENCOUNTER — Encounter: Payer: Self-pay | Admitting: Physician Assistant

## 2017-07-27 VITALS — BP 142/82 | HR 71 | Temp 98.0°F | Resp 16 | Ht 60.04 in | Wt 214.0 lb

## 2017-07-27 DIAGNOSIS — Z79899 Other long term (current) drug therapy: Secondary | ICD-10-CM | POA: Diagnosis not present

## 2017-07-27 DIAGNOSIS — I10 Essential (primary) hypertension: Secondary | ICD-10-CM

## 2017-07-27 DIAGNOSIS — M1A09X1 Idiopathic chronic gout, multiple sites, with tophus (tophi): Secondary | ICD-10-CM

## 2017-07-27 NOTE — Patient Instructions (Signed)
     IF you received an x-ray today, you will receive an invoice from [facility]. Please contact [facility] at [phone] with questions or concerns regarding your invoice.   IF you received labwork today, you will receive an invoice from [facility]. Please contact [facility] at [phone] with questions or concerns regarding your invoice.   Our billing staff will not be able to assist you with questions regarding bills from these companies.  You will be contacted with the lab results as soon as they are available. The fastest way to get your results is to activate your My Chart account. Instructions are located on the last page of this paperwork. If you have not heard from us regarding the results in 2 weeks, please contact this office.     

## 2017-07-27 NOTE — Progress Notes (Signed)
Subjective:    Patient ID: Samantha Pearson, female    DOB: Aug 14, 1960, 57 y.o.   MRN: [id number]  Samantha Pearson is a 57 year old female presenting for reevaluation of blood pressure. She is monitoring her BP at home 30-77minutes after waking up. It has been running 155/72-75. She does report one episode occurring yesterday when her systolic pressure was in the 190's. She notes having a slight headache and feeling generally unwell and mildly depressed that day. A couple hours after her intial reading, she retook the BP and found systolic to have decreased to 409.  She is still taking 20mg  of prednisone qam. She notes that the prednisone makes her a bit dizzy. She is eating 2 egg whites and fruit and 16oz of water with the tablets. Still experiencing pain in hands (esp. Right) and notes that it is more localized along the DIP and PIP of the right middle and ring fingers. She is able to make more of a complete fist in the right hand although still has some difficulty along with pinky finger locking.   She also notes intermittent sharp or tingling pain in her left foot/leg when laying down at night.  She has increased her activity level by walking and doing hand exercises. She has an appointment with Dr. Kathi Ludwig on 07/31/17; however, there have been some confusion with Dr. Kathi Ludwig stating she will not sign her FMLA.    Review of Systems  Constitutional: Negative for chills and fever.  Eyes: Negative for photophobia, pain and visual disturbance.  Respiratory: Negative for chest tightness and shortness of breath.   Cardiovascular: Positive for palpitations (h/o murmur). Negative for chest pain.  Gastrointestinal: Positive for diarrhea (colchicine related). Negative for blood in stool and vomiting.  Endocrine: Positive for heat intolerance (perimenopausal).  Genitourinary: Negative for difficulty urinating, dysuria, frequency and hematuria.  Neurological: Positive for dizziness (off balance since taking  prednisone). Negative for light-headedness and headaches.       Objective:   Physical Exam  Constitutional: She is oriented to person, place, and time. [name].  HENT:  Head: Normocephalic and atraumatic.  Right Ear: Hearing and external ear normal.  Left Ear: Hearing and external ear normal.  Eyes: Pupils are equal, round, and reactive to light. Conjunctivae are normal.  Neck: Normal range of motion.  Cardiovascular: Normal rate and regular rhythm.  Murmur (chronic) heard. Pulses:      Radial pulses are 2+ on the right side, and 2+ on the left side.       Posterior tibial pulses are 2+ on the right side, and 2+ on the left side.  Pulmonary/Chest: Effort normal and breath sounds normal. No stridor. She has no wheezes. She has no rales.  Musculoskeletal:       Right lower leg: She exhibits no edema.       Left lower leg: She exhibits no edema.  Neurological: She is alert and oriented to person, place, and time.  Skin: Skin is warm and dry. No pallor.  Psychiatric: She has a normal mood and affect. [name]. Judgment and thought content normal.  Vitals reviewed.  Vitals:   07/27/17 1103  BP: (!) 142/82  Pulse: 71  Resp: 16  Temp: 98 F (36.7 C)  SpO2: 96%       Assessment & Plan:  1. Essential hypertension Patient should be decreasing her prednisone taper to one 10mg  tablet daily by tomorrow. Her blood pressure today is a significant improvement  from last visit (07/21/17), and suspect it will continue to improve as taper moves . Continue current regimen and reevaluate in 1-2 weeks based on patient's desire. Will reevaluate pinky finger at that time. We will complete FMLA form today. Continue regimen of colchicine and prednisone per Rheumatology's directions. Patient will be following up with rheumatology for continued monitoring of gout pain.   2. Idiopathic chronic gout of multiple sites with tophus  3. High risk  medication use -continue to monitor closely while patient continues prednisone    Patient was initially seen by a PA student who took the history and did a physical exam. I confirmed the history with the patient and performed an independent exam.  I was directly involved with the patient's care and agree with the diagnosis and treatment plan. Note was adjusted with my history and physical exam findings.  Benny LennertSarah Ramonte Mena PA-C  [facility] 07/30/2017 7:43 AM

## 2017-07-29 ENCOUNTER — Other Ambulatory Visit: Payer: Self-pay | Admitting: Physician Assistant

## 2017-07-29 DIAGNOSIS — R601 Generalized edema: Secondary | ICD-10-CM

## 2017-07-30 ENCOUNTER — Encounter: Payer: Self-pay | Admitting: Physician Assistant

## 2017-07-30 ENCOUNTER — Telehealth: Payer: Self-pay | Admitting: Physician Assistant

## 2017-07-30 NOTE — Telephone Encounter (Signed)
Copied from CRM [id number]. Topic: Quick Communication - Rx Refill/Question >> Jul 30, 2017  2:19 PM Alexander BergeronBarksdale, Harvey B wrote: Medication: olmesartan (BENICAR) 40 MG tablet [[id number]][[id number]]   Has the patient contacted their pharmacy? Yes.   (Agent: If no, request that the patient contact the pharmacy for the refill.) (Agent: If yes, when and what did the pharmacy advise?)  Preferred Pharmacy (with phone number [facility] street [facility]  Agent: Please be advised that RX refills may take up to 3 business days. We ask that you follow-up with your pharmacy.

## 2017-07-31 DIAGNOSIS — M130 Polyarthritis, unspecified: Secondary | ICD-10-CM | POA: Diagnosis not present

## 2017-07-31 DIAGNOSIS — M109 Gout, unspecified: Secondary | ICD-10-CM | POA: Diagnosis not present

## 2017-07-31 DIAGNOSIS — I10 Essential (primary) hypertension: Secondary | ICD-10-CM | POA: Diagnosis not present

## 2017-07-31 DIAGNOSIS — E669 Obesity, unspecified: Secondary | ICD-10-CM | POA: Diagnosis not present

## 2017-07-31 NOTE — Telephone Encounter (Signed)
Spoke with [facility]ingJim at [facility] who states that prescription is available at the pharmacy and can be filled for the pt. Rosanne AshingJim states that the pharmacy has been having trouble getting the 40mg  tab of Benicar so it has been switched to 20mg  (2 tabs) to be taken daily.

## 2017-08-02 DIAGNOSIS — M25562 Pain in left knee: Secondary | ICD-10-CM | POA: Diagnosis not present

## 2017-08-02 DIAGNOSIS — M25561 Pain in right knee: Secondary | ICD-10-CM | POA: Diagnosis not present

## 2017-08-03 ENCOUNTER — Other Ambulatory Visit: Payer: Self-pay | Admitting: Physician Assistant

## 2017-08-03 DIAGNOSIS — I10 Essential (primary) hypertension: Secondary | ICD-10-CM

## 2017-08-06 DIAGNOSIS — M25561 Pain in right knee: Secondary | ICD-10-CM | POA: Diagnosis not present

## 2017-08-07 DIAGNOSIS — Z0271 Encounter for disability determination: Secondary | ICD-10-CM

## 2017-08-14 ENCOUNTER — Other Ambulatory Visit: Payer: Self-pay

## 2017-08-14 ENCOUNTER — Ambulatory Visit: Payer: BLUE CROSS/BLUE SHIELD | Admitting: Physician Assistant

## 2017-08-14 ENCOUNTER — Encounter: Payer: Self-pay | Admitting: Physician Assistant

## 2017-08-14 VITALS — BP 138/70 | HR 88 | Temp 99.2°F | Resp 18 | Ht 60.04 in | Wt 210.8 lb

## 2017-08-14 DIAGNOSIS — M1A09X1 Idiopathic chronic gout, multiple sites, with tophus (tophi): Secondary | ICD-10-CM | POA: Diagnosis not present

## 2017-08-14 DIAGNOSIS — R601 Generalized edema: Secondary | ICD-10-CM

## 2017-08-14 DIAGNOSIS — I10 Essential (primary) hypertension: Secondary | ICD-10-CM

## 2017-08-14 DIAGNOSIS — F4321 Adjustment disorder with depressed mood: Secondary | ICD-10-CM

## 2017-08-14 NOTE — Progress Notes (Signed)
Samantha Pearson  MRN: [id number] DOB: 08-Aug-1960  PCP: Morrell Riddle, PA-C  Chief Complaint  Patient presents with  . Hypertension    follow up     Subjective:  Pt presents to clinic for recheck.  She is still on the prednisone but a low dose.  She has stopped the colchicine because she was not sure if she still needed to be on it. She was started with allopurinol about 3 weeks ago and has a f/u appt on 7/24 with rheumatologist.  She has noted decreased swelling in all of her fingers as well as her feet.  She has had no swelling in her lower legs.  She has had no chest pain and no shortness of breath.  She has seen orthopedics for bilateral knee pain is been told she might need bilateral knee replacements.  At this point she is can continue with her weight loss and see how her pain is once her gout is controlled.  Dad died on Jul 01, 2022 - he had CHF -she feels like she is handling the grief okay.  She has a lot of work to do in regards to cleaning out of his house.  She is always siblings that she feels like a lot of this is resting on her shoulders.  Since his death she feels like she has improved in regards to sadness, sleep, and eating. Cardiology appt 7/10 -  Rheumatology - 7/24 appt -    She is unsure she can go back to work at this point.  Part of this is her continued pain with her gout.  Part of this is related to stress with Recent death of father.  She still continues to eat well and continue good hydration.  History is obtained by patient.  Review of Systems  Constitutional: Negative for chills and fever.  Eyes: Negative for visual disturbance.  Respiratory: Negative for shortness of breath.   Cardiovascular: Negative for chest pain, palpitations and leg swelling.  Musculoskeletal: Positive for arthralgias (improved).  Neurological: Negative for dizziness, light-headedness and headaches.    Patient Active Problem List   Diagnosis Date Noted  . Idiopathic chronic gout of  multiple sites with tophus 07/23/2017  . Left knee pain 03/14/2016  . Class 3 obesity due to excess calories with serious comorbidity and body mass index (BMI) of 40.0 to 44.9 in adult 08/18/2013  . Stress 05/22/2011  . Essential hypertension 08/07/2008    Current Outpatient Medications on File Prior to Visit  Medication Sig Dispense Refill  . cetirizine (ZYRTEC) 10 MG tablet Take 10 mg by mouth daily.    . Glucosamine-Chondroitin (GLUCOSAMINE CHONDR COMPLEX PO) Take by mouth daily.    . Multiple Vitamin (MULTIVITAMIN) tablet Take 1 tablet by mouth daily.    Marland Kitchen olmesartan (BENICAR) 40 MG tablet Take 1 tablet (40 mg total) by mouth daily. 90 tablet 0  . OVER THE COUNTER MEDICATION OTC Vitamin  B50 500 mg taking daily    . predniSONE (DELTASONE) 10 MG tablet TAKE 3 TABS A DAY FOR 2 DAYS, THEN 2 TABS A DAY FOR 7 DAYS, THEN 1 TAB A DAY WITH FOOD OR MILK  0  . spironolactone (ALDACTONE) 50 MG tablet TAKE 1 TABLET EVERY DAY (DO NOT TAKE POTASSIUM SUPPLEMENT WITH THIS MEDICATION) 30 tablet 0  . traMADol (ULTRAM) 50 MG tablet Take 1 tablet (50 mg total) by mouth 2 (two) times daily. 30 tablet 3  . Turmeric POWD 1,950 mg by Does not apply route  3 (three) times daily.    Marland Kitchen. allopurinol (ZYLOPRIM) 100 MG tablet TAKE 1 TABLET BY MOUTH EVERY DAY FOR 2 WEEKS, THEN 2 TABS ONCE A DAY  2  . MITIGARE 0.6 MG CAPS Take 1 capsule by mouth 2 (two) times daily.  2   No current facility-administered medications on file prior to visit.     Allergies  Allergen Reactions  . Doxycycline Hives, Itching and Swelling  . Norvasc [Amlodipine Besylate] Swelling    Feet swelling  . Prednisone Swelling    Pt was having controlled BP at the time and her fluid pill was also stopped - do no believe this is a true allergy  . Septra [Bactrim] Hives, Itching and Swelling  . Latex Swelling and Rash    Past Medical History:  Diagnosis Date  . Allergy   . Anxiety   . Arthritis   . Chest pain 08/03/08   Urgent care visit    . Depression   . Dyspnea   . Edema   . Heart murmur   . Hypertension   . 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 Smoking status: Never Smoker  . Smokeless tobacco: Never Used  . Tobacco comment: Nonsmoker  Substance Use Topics  . Alcohol use: No    Alcohol/week: 0.0 oz  . Drug use: No   family history includes Cancer (age of onset: 8971) in her mother; Colon cancer in her maternal uncle; Colon polyps in her mother; Heart failure in her father; Hypertension in her brother, sister, sister, sister, and sister; Prostate cancer in her father.     Objective:  BP 138/70   Pulse 88   Temp 99.2 F (37.3 C) (Oral)   Resp 18   Ht 5' 0.04" (1.525 m)   Wt 210 lb 12.8 oz (95.6 kg)   LMP 04/09/2014   SpO2 98%   BMI 41.11 kg/m  Body mass index is 41.11 kg/m.  Wt Readings from Last 3 Encounters:  08/14/17 210 lb 12.8 oz (95.6 kg)  07/27/17 214 lb (97.1 kg)  07/21/17 216 lb (98 kg)    Physical Exam  Constitutional: She is oriented to person, place, and time. [name].  HENT:  Head: Normocephalic and atraumatic.  Right Ear: Hearing and external ear normal.  Left Ear: Hearing and external ear normal.  Eyes: Conjunctivae are normal.  Neck: Normal range of motion.  Cardiovascular: Normal rate, regular rhythm and normal heart sounds.  No murmur heard. Pulmonary/Chest: Effort normal and breath sounds normal.  Musculoskeletal:       Right lower leg: She exhibits no edema.       Left lower leg: She exhibits no edema.   Neurological: She is alert and oriented to person, place, and time.  Skin: Skin is warm and dry.  Psychiatric: She has a normal mood and affect. [name]. Judgment and thought content  normal.  Vitals reviewed.   Assessment and Plan :  Essential hypertension -not controlled today but with recent stress of family member-and decreased sleep we will continue to watch this prior to increasing medication.  She will continue to monitor this at home.  She has an appointment with cardiology later this week.  Idiopathic chronic gout of multiple sites with tophus much improved.  Has appointment rheumatology for follow-up -hopefully they will check labs for uric acid since she has been started on allopurinol.  They should be working with her for her FMLA and short-term disability.  Generalized edema - resolved today -patient will continue to monitor  Grief reaction grief reaction due to recent death of father.  Has a lot of work to do related to his house is being the older sibling.  Offered -encouragement that this is normal and I am here for her if she needs anything.  Patient verbalized to me that they understand the following: diagnosis, what is being done for them, what to expect and what should be done at home.  Their questions have been answered.  See after visit summary for patient specific instructions.  Benny Lennert PA-C  Primary Care at [facility] 08/14/2017 11:55 AM  Please note: Portions of this report may have been transcribed using dragon voice recognition software. Every effort was made to ensure accuracy; however, inadvertent computerized transcription errors may be present.

## 2017-08-14 NOTE — Patient Instructions (Addendum)
  Should you be taking colchicine still  When is your short term disability through currently   IF you received an x-ray today, you will [facility] an [facility]. Please contact [facility] at [phone] with questions or concerns regarding your invoice.   IF you received labwork today, you will receive an [facility] from [facility]. Please contact [facility] at [phone] with questions or concerns regarding your invoice.   Our billing staff will not be able to assist you with questions regarding bills from these companies.  You will be contacted with the lab results as soon as they are available. The fastest way to get your results is to activate your My Chart account. Instructions are located on the last page of this paperwork. If you have not heard from us regarding the results in 2 weeks, please contact this office.

## 2017-08-15 ENCOUNTER — Encounter: Payer: Self-pay | Admitting: Physician Assistant

## 2017-08-15 NOTE — Progress Notes (Signed)
Cardiology Office Note:    Date:  08/16/2017   ID:  Samantha Pearson, DOB [birth year], MRN [id number]  PCP:  Morrell Riddle, PA-C  Cardiologist: Jodelle Red, MD PhD  Referring MD: Morrell Riddle, PA-C   Chief Complaint  Patient presents with  . Shortness of Breath    History of Present Illness:    Samantha Pearson is a 57 y.o. female with a hx of hypertension and obesity who is seen as a new consult at the request of Benny Lennert for evaluation and management of elevated BNP and shortness of breath.  Patient reports that she was feeling dizzy and short of breath in early May, having orthopnea and increased swelling. It was shortly after she stopped chlorthalidone because of issues with uric acid and gout. She was started on spironolactone and an ARB with improvement. Gradually over time her symptoms have improved, but she still has some shortness of breath with exertion.   Has a history of palpitations, worse with stress and anxiety, better with relaxing and deep breathing.Was told she had a heart murmur, had an echo as a teenager and was told she couldn't participate. Has had some chest tightness and heart racing for the last week, in the setting of family stress and the loss of her father. Better with deep breaths and trying to keep calm. Not related to exertion, nonradiating, no associated n/v/diaphoresis. Not positional.   She does endorse fatigue. Her Epworth sleepiness scale is 2, and her risk factors are high blood pressure and obesity.  Doesn't weigh herself at home, but has been weighing herself at routine doctors visits (weekly, then biweekly). Has been off of work to allow herself to heal, but returns to work next week.   Past Medical History:  Diagnosis Date  . Allergy   . Anxiety   . Arthritis   . Chest pain 08/03/08   Urgent care visit  . Depression   . Dyspnea   . Edema   . Heart murmur   . Hypertension   . Obesity     Past Surgical History:  Procedure  Laterality Date  . CESAREAN SECTION    . DILATION AND CURETTAGE OF UTERUS     laparoscopic    Current Medications: Current Outpatient Medications on File Prior to Visit  Medication Sig  . allopurinol (ZYLOPRIM) 100 MG tablet TAKE 1 TABLET BY MOUTH EVERY DAY FOR 2 WEEKS, THEN 2 TABS ONCE A DAY  . Glucosamine-Chondroitin (GLUCOSAMINE CHONDR COMPLEX PO) Take by mouth daily.  Marland Kitchen MITIGARE 0.6 MG CAPS Take 1 capsule by mouth 2 (two) times daily.  . Multiple Vitamin (MULTIVITAMIN) tablet Take 1 tablet by mouth daily.  Marland Kitchen olmesartan (BENICAR) 40 MG tablet Take 1 tablet (40 mg total) by mouth daily.  . predniSONE (DELTASONE) 10 MG tablet TAKE 3 TABS A DAY FOR 2 DAYS, THEN 2 TABS A DAY FOR 7 DAYS, THEN 1 TAB A DAY WITH FOOD OR MILK  . spironolactone (ALDACTONE) 50 MG tablet TAKE 1 TABLET EVERY DAY (DO NOT TAKE POTASSIUM SUPPLEMENT WITH THIS MEDICATION)  . traMADol (ULTRAM) 50 MG tablet Take 1 tablet (50 mg total) by mouth 2 (two) times daily.  . Turmeric POWD 1,950 mg by Does not apply route 3 (three) times daily.   No current facility-administered medications on file prior to visit.      Allergies:   Doxycycline; Norvasc [amlodipine besylate]; Septra [bactrim]; and Latex   Social History   Socioeconomic History  . Marital  status: Single    Spouse name: Not on file  . Number of children: 1  . Years of education: Not on file  . Highest education level: Not on file  Occupational History  . Occupation: 3 jobs    Comment: International aid/development worker  Social Needs  . Financial resource strain: Not on file  . Food insecurity:    Worry: Not on file    Inability: Not on file  . Transportation needs:    Medical: Not on file    Non-medical: Not on file  Tobacco Use  . Smoking status: Never Smoker  . Smokeless tobacco: Never Used  . Tobacco comment: Nonsmoker  Substance and Sexual Activity  . Alcohol use: No    Alcohol/week: 0.0 oz  . Drug use: No  . Sexual activity: Yes    Birth  control/protection: None  Lifestyle  . Physical activity:    Days per week: Not on file    Minutes per session: Not on file  . Stress: Not on file  Relationships  . Social connections:    Talks on phone: Not on file    Gets together: Not on file    Attends religious service: Not on file    Active member of club or organization: Not on file    Attends meetings of clubs or organizations: Not on file    Relationship status: Not on file  Other Topics Concern  . 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 There is no history of Esophageal cancer, Stomach cancer, or Rectal cancer. Father just passed away, funeral is tomorrow.  ROS:   Please see the history of present illness.  Additional pertinent ROS: Review of Systems  Constitutional: Positive for malaise/fatigue. Negative for chills and fever.  HENT: Negative for ear pain and hearing loss.   Eyes: Negative for double vision and pain.  Respiratory: Positive for shortness of breath. Negative for cough and wheezing.   Cardiovascular: Positive for chest pain and palpitations. Negative for orthopnea, claudication, leg swelling and PND.  Gastrointestinal: Negative for abdominal pain, blood in stool and melena.  Genitourinary: Negative for hematuria.  Musculoskeletal: Positive for joint pain and  myalgias. Negative for falls.  Skin: Positive for rash.  Neurological: Negative for focal weakness and loss of consciousness.  Endo/Heme/Allergies: Does not bruise/bleed easily.   EKGs/Labs/Other Studies Reviewed:    The following studies were personally reviewed today: ECG from 06/15/17:   EKG:  EKG is ordered today.  The ekg ordered today demonstrates normal sinus rhythm  Recent Labs: 06/05/2017: ALT 20 06/15/2017: Hemoglobin 10.9; Platelets 280 06/22/2017: BNP 72.2; BUN 16; Creatinine, Ser 0.91; Sodium 143 06/29/2017: Potassium 3.9  Recent Lipid Panel    Component  Value Date/Time   CHOL 226 (A) 11/27/2016   TRIG 217 (H) 11/27/2015 1538   HDL 54 11/27/2016   CHOLHDL 3.5 11/27/2015 1538   VLDL 43 (H) 11/27/2015 1538   LDLCALC 98 11/27/2015 1538    Physical Exam:    VS:  BP (!) 159/86 (BP Location: Left Arm, Patient Position: Sitting, Cuff Size: Large)   Ht 4\' 11"  (1.499 m)   Wt 214 lb 6.4 oz (97.3 kg)   LMP 04/09/2014   BMI 43.30 kg/m     Wt Readings from Last 3 Encounters:  08/16/17 214 lb 6.4 oz (97.3 kg)  08/14/17 210 lb 12.8 oz (95.6 kg)  07/27/17 214 lb (97.1 kg)     GEN: Well nourished, well developed in no acute distress HEENT: Normal NECK: No JVD; No carotid bruits LYMPHATICS: No lymphadenopathy CARDIAC: regular rhythm, normal S1 and S2, no rubs, gallops. 1/6 systolic flow murmur at RUSB. Radial and DP pulses 2+ bilaterally RESPIRATORY:  Clear to auscultation without rales, wheezing or rhonchi  ABDOMEN: Soft, non-tender, non-distended MUSCULOSKELETAL:  No edema; No deformity  SKIN: Warm and dry NEUROLOGIC:  Alert and oriented x 3 PSYCHIATRIC:  Normal affect   ASSESSMENT:    1. SOB (shortness of breath)   2. Shortness of breath   3. At risk for obstructive sleep apnea   4. Morbid obesity (HCC)   5. Essential hypertension    PLAN:    1. Shortness of breath: improved after change in medications. Had BNP elevation to 131 pg/mL on 5/10, and then on 5/17  BNP was 72.2 -No indication to recheck BNP unless symptoms change. -given her shortness of breath, her father's history of heart failure, and her murmur, will order an echo to evaluate for structural heart disease as a cause of her shortness of breath. -appears euvolemic today -currently on prednisone for gout, which is causing her issues.   2. At risk for sleep apnea: given her fatigue, obesity, and hypertension, I am concerned about sleep apnea. This would also put extra stress on the heart and could compound diastolic dysfunction if present. She isn't sure if she snores or stops breathing at night, but she hasn't been sleeping well. Ideally would like to do a sleep study once the prednisone is complete to track a normal night of sleep. She will message us once she is off the prednisone and we will send her for a sleep study.  3. Hypertension: elevated in office and higher since starting the prednisone, but reports home numbers have been at goal -continue spironolactone and olmesartan, recheck after completion of prednisone  4. Obesity: this may be contributing to her shortness of breath. Did not extensively counsel today given her stress level, but will continue to address in the future.  I will see her back in 3 mos to monitor how she is doing with her breathing. She is under extreme stress right now, and I did not want to overwhelm her today. Will follow up on echo results.  Medication Adjustments/Labs and Tests Ordered: Current medicines are reviewed at length with the patient today.  Concerns regarding medicines are outlined above.  Orders Placed This Encounter  Procedures  . EKG 12-Lead  . ECHOCARDIOGRAM COMPLETE   No orders of the defined types were placed in this encounter.   Patient Instructions  Medication Instructions: Your physician recommends that you continue on your current medications as directed.    If you need a refill on your cardiac medications before your next  appointment, please call your pharmacy.   Labwork: None  Procedures/Testing: Your physician has requested that you have an echocardiogram. Echocardiography is a painless test that uses sound waves to create images of your heart. It provides your doctor with information about the size and shape of your heart and how well your heart's chambers and valves are working. This procedure takes approximately one hour. There are no restrictions for this procedure. [address]   Follow-Up: Your physician wants you to follow-up in 3 months with Dr. Cristal Deer.   Special Instructions:    Thank you for choosing Heartcare at [facility]!!       Signed, Jodelle Red, MD PhD 08/16/2017 5:14 PM    [facility]

## 2017-08-16 ENCOUNTER — Encounter: Payer: Self-pay | Admitting: Cardiology

## 2017-08-16 ENCOUNTER — Ambulatory Visit: Payer: BLUE CROSS/BLUE SHIELD | Admitting: Cardiology

## 2017-08-16 VITALS — BP 159/86 | Ht 59.0 in | Wt 214.4 lb

## 2017-08-16 DIAGNOSIS — R0602 Shortness of breath: Secondary | ICD-10-CM | POA: Diagnosis not present

## 2017-08-16 DIAGNOSIS — Z9189 Other specified personal risk factors, not elsewhere classified: Secondary | ICD-10-CM

## 2017-08-16 DIAGNOSIS — I10 Essential (primary) hypertension: Secondary | ICD-10-CM | POA: Diagnosis not present

## 2017-08-16 NOTE — Patient Instructions (Signed)
Medication Instructions: Your physician recommends that you continue on your current medications as directed.    If you need a refill on your cardiac medications before your next appointment, please call your pharmacy.   Labwork: None  Procedures/Testing: Your physician has requested that you have an echocardiogram. Echocardiography is a painless test that uses sound waves to create images of your heart. It provides your doctor with information about the size and shape of your heart and how well your heart's chambers and valves are working. This procedure takes approximately one hour. There are no restrictions for this procedure. [address]   Follow-Up: Your physician wants you to follow-up in 3 months with Dr. Cristal Deerhristopher.   Special Instructions:    Thank you for choosing Heart[facility] at [facility]!!

## 2017-08-22 ENCOUNTER — Other Ambulatory Visit: Payer: Self-pay | Admitting: Physician Assistant

## 2017-08-22 DIAGNOSIS — R601 Generalized edema: Secondary | ICD-10-CM

## 2017-08-23 DIAGNOSIS — Z6841 Body Mass Index (BMI) 40.0 and over, adult: Secondary | ICD-10-CM | POA: Diagnosis not present

## 2017-08-23 DIAGNOSIS — Z124 Encounter for screening for malignant neoplasm of cervix: Secondary | ICD-10-CM | POA: Diagnosis not present

## 2017-08-23 DIAGNOSIS — Z01419 Encounter for gynecological examination (general) (routine) without abnormal findings: Secondary | ICD-10-CM | POA: Diagnosis not present

## 2017-08-30 ENCOUNTER — Telehealth: Payer: Self-pay

## 2017-08-30 ENCOUNTER — Ambulatory Visit (HOSPITAL_COMMUNITY): Payer: BLUE CROSS/BLUE SHIELD | Attending: Cardiology

## 2017-08-30 ENCOUNTER — Other Ambulatory Visit: Payer: Self-pay

## 2017-08-30 DIAGNOSIS — E669 Obesity, unspecified: Secondary | ICD-10-CM | POA: Insufficient documentation

## 2017-08-30 DIAGNOSIS — R011 Cardiac murmur, unspecified: Secondary | ICD-10-CM | POA: Insufficient documentation

## 2017-08-30 DIAGNOSIS — R609 Edema, unspecified: Secondary | ICD-10-CM | POA: Diagnosis not present

## 2017-08-30 DIAGNOSIS — F419 Anxiety disorder, unspecified: Secondary | ICD-10-CM | POA: Insufficient documentation

## 2017-08-30 DIAGNOSIS — R0602 Shortness of breath: Secondary | ICD-10-CM | POA: Insufficient documentation

## 2017-08-30 DIAGNOSIS — R0600 Dyspnea, unspecified: Secondary | ICD-10-CM | POA: Diagnosis not present

## 2017-08-30 DIAGNOSIS — R079 Chest pain, unspecified: Secondary | ICD-10-CM | POA: Diagnosis not present

## 2017-08-30 DIAGNOSIS — Z6841 Body Mass Index (BMI) 40.0 and over, adult: Secondary | ICD-10-CM | POA: Diagnosis not present

## 2017-08-30 DIAGNOSIS — M109 Gout, unspecified: Secondary | ICD-10-CM | POA: Diagnosis not present

## 2017-08-30 DIAGNOSIS — I10 Essential (primary) hypertension: Secondary | ICD-10-CM | POA: Insufficient documentation

## 2017-08-30 DIAGNOSIS — M1990 Unspecified osteoarthritis, unspecified site: Secondary | ICD-10-CM | POA: Diagnosis not present

## 2017-08-30 NOTE — Telephone Encounter (Signed)
Left message to call back to inform of ECHO results.

## 2017-08-30 NOTE — Telephone Encounter (Signed)
-----   Message from Jodelle RedBridgette Christopher, MD sent at 08/30/2017  2:04 PM EDT ----- Echo is largely normal; some mild relaxation abnormalities (diastolic dysfunction) but unlikely to be causing her symptoms based on her physical exam. Recommend she pursue the sleep study once she is off prednisone, as we discussed at the visit. Thanks!

## 2017-09-05 ENCOUNTER — Other Ambulatory Visit: Payer: Self-pay | Admitting: Physician Assistant

## 2017-09-05 DIAGNOSIS — R601 Generalized edema: Secondary | ICD-10-CM

## 2017-09-10 NOTE — Telephone Encounter (Signed)
Results reviewed in mychart.  

## 2017-09-13 ENCOUNTER — Other Ambulatory Visit: Payer: Self-pay | Admitting: Physician Assistant

## 2017-09-13 ENCOUNTER — Telehealth: Payer: Self-pay | Admitting: Physician Assistant

## 2017-09-13 DIAGNOSIS — R7989 Other specified abnormal findings of blood chemistry: Secondary | ICD-10-CM

## 2017-09-13 DIAGNOSIS — R601 Generalized edema: Secondary | ICD-10-CM

## 2017-09-13 DIAGNOSIS — I10 Essential (primary) hypertension: Secondary | ICD-10-CM

## 2017-09-13 NOTE — Telephone Encounter (Signed)
Called and left VM with patient, reminding them of appointment tomorrow 09/14/17. Advised of time, building number and late policy.  °

## 2017-09-14 ENCOUNTER — Encounter: Payer: Self-pay | Admitting: Physician Assistant

## 2017-09-14 ENCOUNTER — Other Ambulatory Visit: Payer: Self-pay

## 2017-09-14 ENCOUNTER — Ambulatory Visit: Payer: BLUE CROSS/BLUE SHIELD | Admitting: Physician Assistant

## 2017-09-14 VITALS — BP 160/76 | HR 82 | Temp 98.6°F | Ht 59.0 in | Wt 216.4 lb

## 2017-09-14 DIAGNOSIS — R601 Generalized edema: Secondary | ICD-10-CM | POA: Diagnosis not present

## 2017-09-14 DIAGNOSIS — M1A09X0 Idiopathic chronic gout, multiple sites, without tophus (tophi): Secondary | ICD-10-CM

## 2017-09-14 DIAGNOSIS — F4321 Adjustment disorder with depressed mood: Secondary | ICD-10-CM | POA: Diagnosis not present

## 2017-09-14 DIAGNOSIS — I10 Essential (primary) hypertension: Secondary | ICD-10-CM

## 2017-09-14 MED ORDER — SPIRONOLACTONE 50 MG PO TABS: ORAL_TABLET | ORAL | 1 refills | Status: DC

## 2017-09-14 MED ORDER — OLMESARTAN MEDOXOMIL 40 MG PO TABS: 40.0000 mg | ORAL_TABLET | Freq: Every day | ORAL | 0 refills | Status: DC

## 2017-09-14 MED ORDER — FEBUXOSTAT 40 MG PO TABS: 40.0000 mg | ORAL_TABLET | Freq: Every day | ORAL | 0 refills | Status: DC

## 2017-09-14 NOTE — Patient Instructions (Addendum)
Start new medication for gout  For the benicar I want you to take 40mg  (look at the mg on your bottle) at the same time every day  Recheck with me in 3 weeks  [facility] - [address], [place], [place] [address] Phone: [phone]   IF you received an x-ray today, you will receive an invoice from [facility]. Please contact [facility] at [phone] with questions or concerns regarding your invoice.   IF you received labwork today, you will receive an invoice from [facility]. Please contact [facility] at [phone] with questions or concerns regarding your invoice.   Our billing staff will not be able to assist you with questions regarding bills from these companies.  You will be contacted with the lab results as soon as they are available. The fastest way to get your results is to activate your My Chart account. Instructions are located on the last page of this paperwork. If you have not heard from us regarding the results in 2 weeks, please contact this office.

## 2017-09-14 NOTE — Progress Notes (Signed)
Samantha Pearson  MRN: [id number] DOB: 26-Dec-1960  PCP: Morrell Riddle, PA-C  Chief Complaint  Patient presents with  . Medication Refill    spironolactone  . med check    BP    Subjective:  Pt presents to clinic for recheck of her BP.  A lot of stress with her fathers death and his estate - she hopes within the next month or so this will be taken care of.  Closed  She is in the process of looking for another job because her current job is causing her increase walking and continued use of her hands.    She currently is not having pain in regards to her gout.  She is off the prednisone and she feels like her gout is well controlled and she is still taking the colchcine for her gout.  She tried what we think is allopurinol and it made her feel bad so she stopped it. She is not sure she wants to do anything else for her gout.  She is having no swelling.  She has BP medication probems with her benicar and not being able to get 40mg  -- she has been using 20mg  bid and she has been missing her evening dose a lot over the last week or so.  History is obtained by patient.  Review of Systems  Constitutional: Negative for chills and fever.  Eyes: Negative for visual disturbance.  Respiratory: Negative for shortness of breath.   Cardiovascular: Negative for chest pain, palpitations and leg swelling.  Genitourinary: Negative for decreased urine volume.  Musculoskeletal: Positive for arthralgias (Much improved).  Neurological: Negative for dizziness, light-headedness and headaches.  Psychiatric/Behavioral: The patient is nervous/anxious (In dealing with her dad's estate issues, not sleeping well but overall does feel better than she did at her last appointment a month ago).     Patient Active Problem List   Diagnosis Date Noted  . 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 Left knee pain 03/14/2016  . Class 3 obesity due to excess calories with serious  comorbidity and body mass index (BMI) of 40.0 to 44.9 in adult 08/18/2013  . Stress 05/22/2011  . Essential hypertension 08/07/2008    Current Outpatient Medications on File Prior to Visit  Medication Sig Dispense Refill  . Glucosamine-Chondroitin (GLUCOSAMINE CHONDR COMPLEX PO) Take by mouth daily.    Marland Kitchen MITIGARE 0.6 MG CAPS Take 1 capsule by mouth 2 (two) times daily.  2  . Turmeric POWD 1,950 mg by Does not apply route 3 (three) times daily.     No current facility-administered medications on file prior to visit.     Allergies  Allergen Reactions  . Doxycycline Hives, Itching and Swelling  . Norvasc [Amlodipine Besylate] Swelling    Feet swelling  . Septra [Bactrim] Hives, Itching and Swelling  . Latex Swelling and Rash    Past Medical History:  Diagnosis Date  . Allergy   . Anxiety   . Arthritis   . Chest pain 08/03/08   Urgent care visit  . Depression   . Dyspnea   . Edema   . Heart murmur   . Hypertension   . 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 Smoking status: Never Smoker  . Smokeless tobacco: Never Used  . Tobacco comment: Nonsmoker  Substance Use Topics  . Alcohol use: No    Alcohol/week: 0.0 standard drinks  . Drug use: No   family history includes Cancer (age of onset: 98) in her mother; Colon cancer in her maternal uncle; Colon polyps in her mother; Heart failure in her father; Hypertension in her brother, sister, sister, sister, and sister; Prostate cancer in her father.     Objective:  BP (!) 160/76   Pulse 82   Temp 98.6  F (37 C) (Oral)   Ht 4\' 11"  (1.499 m)   Wt 216 lb 6.4 oz (98.2 kg)   LMP 04/09/2014   SpO2 98%   BMI 43.71 kg/m  Body mass index is 43.71 kg/m.  Wt Readings from Last 3 Encounters:  09/14/17 216 lb 6.4 oz (98.2 kg)  08/16/17 214 lb 6.4 oz (97.3 kg)  08/14/17 210 lb 12.8 oz (95.6 kg)    Physical Exam  Constitutional: She is oriented to person, place, and time. [name].  HENT:  Head: Normocephalic and atraumatic.  Right Ear: Hearing and external ear normal.  Left Ear: Hearing and external ear normal.  Eyes: Conjunctivae are normal.  Neck: Normal range of motion.  Cardiovascular: Normal rate, regular rhythm and normal heart sounds.  No murmur heard. Pulmonary/Chest: Effort normal and breath sounds normal.  Musculoskeletal:       Right lower leg: She exhibits no edema.       Left lower leg: She exhibits no edema.  Reduced swelling in fingers  Neurological: She is alert and oriented to person, place, and time.  Skin: Skin is warm and dry.  Psychiatric: [name]. Judgment and thought content normal.  Slightly teary during visit when speaking about her father in the situation with his estate  Vitals reviewed.   Assessment and Plan :  Idiopathic chronic gout of multiple sites without tophus - Plan: Ambulatory referral to Rheumatology, febuxostat (ULORIC) 40 MG tablet, Uric Acid -patient did not tolerate what we think is allopurinol and is frustrated with visits at rheumatologist, encouraged patient strongly to continue treatment for gout even though she is not having pain now reminded her what her pain was like it several months ago.  We will start Uloric and she will follow-up with me in 3 weeks for repeat uric acid.  Essential hypertension - Plan: olmesartan (BENICAR) 40 MG tablet, Basic metabolic panel - d/w pt the importance of taking the correct dose of medication -she is to take 40 mg of Benicar in the morning.  Continue to  monitor blood pressure at home.  Her blood pressure is not better controlled (it has been until the last 2 visits since the death of her father) we will have to add a calcium channel were beta-blocker at her follow-up in 3 weeks.  Generalized edema - Plan: spironolactone (ALDACTONE) 50 MG tablet, Basic metabolic panel - continue diuretic  Grief reaction - pt feels like she is doing ok and feels like if she can get through this she will be ok  Patient verbalized to me that  they understand the following: diagnosis, what is being done for them, what to expect and what should be done at home.  Their questions have been answered.  See after visit summary for patient specific instructions.  Benny LennertSarah Montrae Braithwaite PA-C  [facility] at [facility] 09/14/2017 6:02 PM  Please note: Portions of this report may have been transcribed using dragon voice recognition software. Every effort was made to ensure accuracy; however, inadvertent computerized transcription errors may be present.

## 2017-09-14 NOTE — Addendum Note (Signed)
Addended by: Morrell RiddleWEBER, SARAH L on: 09/14/2017 06:05 PM   Modules accepted: Level of Service

## 2017-09-15 LAB — URIC ACID: Uric Acid: 7.9 mg/dL — ABNORMAL HIGH (ref 2.5–7.1)

## 2017-09-15 LAB — BASIC METABOLIC PANEL
BUN/Creatinine Ratio: 15 (ref 9–23)
BUN: 17 mg/dL (ref 6–24)
CALCIUM: 9.8 mg/dL (ref 8.7–10.2)
CO2: 24 mmol/L (ref 20–29)
Chloride: 102 mmol/L (ref 96–106)
Creatinine, Ser: 1.16 mg/dL — ABNORMAL HIGH (ref 0.57–1.00)
GFR calc non Af Amer: 53 mL/min/{1.73_m2} — ABNORMAL LOW (ref >59)
GFR, EST AFRICAN AMERICAN: 61 mL/min/{1.73_m2} (ref >59)
Glucose: 87 mg/dL (ref 65–99)
POTASSIUM: 4.1 mmol/L (ref 3.5–5.2)
Sodium: 144 mmol/L (ref 134–144)

## 2017-09-19 ENCOUNTER — Telehealth: Payer: Self-pay

## 2017-09-19 DIAGNOSIS — Z9189 Other specified personal risk factors, not elsewhere classified: Secondary | ICD-10-CM

## 2017-09-19 NOTE — Telephone Encounter (Addendum)
Called pt to inform that Dr. Cristal Deerhristopher would like for her to have a sleep study. Left message to call back. Orders placed and message sent to pre cert.

## 2017-09-21 ENCOUNTER — Encounter: Payer: Self-pay | Admitting: Physician Assistant

## 2017-10-04 ENCOUNTER — Ambulatory Visit: Payer: Self-pay

## 2017-10-04 NOTE — Telephone Encounter (Signed)
Patient called in with c/o "feet, ankle swelling." She says "the swelling started 2 days ago. Yesterday I wasn't able to put on my normal work shoe, so today I have on another pair that I have. The swelling is both feet and ankles. I saw Maralyn SagoSarah and she put me on a new gout medication. I read the pamphlet and it says the gout flare could get worse before it gets better. I believe this is happening now. I have an appointment on Tuesday with her, but if I can be seen sooner, I would like to. I am having pain, rate 8, some redness." I asked about other symptoms, she says "little fever, calf tightness, and my hands are tingling a little." According to protocol, see PCP within 3 days, no availability today or tomorrow with PCP, I offered to schedule the patient with another provider. She asks for me to check with Wallis BambergMario Mani and Deliah BostonMichael Clark. I advised Deliah BostonMichael Clark is no longer at [place] and Marquita PalmsMario is not in the office today or tomorrow. I offered to schedule with another provider, she says "no, I will just tough it out until Tuesday, because Maralyn SagoSarah knows me and I don't want to see anyone else." Care advice given, advised to call back for another provider or go to ED if symptoms worsen, she verbalized understanding.   Reason for Disposition . [1] MODERATE pain (e.g., interferes with normal activities, limping) AND [2] present > 3 days  Answer Assessment - Initial Assessment Questions 1. LOCATION: "Which joint is swollen?"     Both ankles and feet red and swollen 2. ONSET: "When did the swelling start?"     2 days ago 3. SIZE: "How large is the swelling?"     Unable to wear my regular work shoes 4. PAIN: "Is there any pain?" If so, ask: "How bad is it?" (Scale 1-10; or mild, moderate, severe)     Yes, 8 5. CAUSE: "What do you think caused the swollen joint?"     The new gout medications 6. OTHER SYMPTOMS: "Do you have any other symptoms?" (e.g., fever, chest pain, difficulty breathing, calf pain)     Fever,  calf tight-no pain, hands tingling 7. PREGNANCY: "Is there any chance you are pregnant?" "When was your last menstrual period?"     No  Protocols used: ANKLE SWELLING-A-AH

## 2017-10-05 ENCOUNTER — Telehealth: Payer: Self-pay | Admitting: *Deleted

## 2017-10-05 ENCOUNTER — Other Ambulatory Visit: Payer: Self-pay | Admitting: Cardiology

## 2017-10-05 ENCOUNTER — Encounter: Payer: Self-pay | Admitting: *Deleted

## 2017-10-05 DIAGNOSIS — I10 Essential (primary) hypertension: Secondary | ICD-10-CM

## 2017-10-05 DIAGNOSIS — Z9189 Other specified personal risk factors, not elsewhere classified: Secondary | ICD-10-CM

## 2017-10-05 NOTE — Telephone Encounter (Signed)
Letter mailed to patient informing her [employer] denied in lab sleep study. HST was approved and scheduled for October 2 @ 11:00 a.m.

## 2017-10-05 NOTE — Telephone Encounter (Signed)
-----   Message from Parke PoissonAlisha N Wade, RN sent at 09/19/2017 10:00 AM EDT ----- Per Dr. Cristal Deerhristopher, schedule pt in Oct. for study. Thanks

## 2017-10-09 ENCOUNTER — Encounter: Payer: Self-pay | Admitting: Physician Assistant

## 2017-10-09 ENCOUNTER — Other Ambulatory Visit: Payer: Self-pay | Admitting: Physician Assistant

## 2017-10-09 ENCOUNTER — Ambulatory Visit: Payer: BLUE CROSS/BLUE SHIELD | Admitting: Physician Assistant

## 2017-10-09 VITALS — BP 128/62 | HR 97 | Temp 98.9°F | Resp 16 | Ht 59.0 in | Wt 204.2 lb

## 2017-10-09 DIAGNOSIS — R7989 Other specified abnormal findings of blood chemistry: Secondary | ICD-10-CM

## 2017-10-09 DIAGNOSIS — M1A09X0 Idiopathic chronic gout, multiple sites, without tophus (tophi): Secondary | ICD-10-CM | POA: Diagnosis not present

## 2017-10-09 DIAGNOSIS — R601 Generalized edema: Secondary | ICD-10-CM

## 2017-10-09 DIAGNOSIS — M109 Gout, unspecified: Secondary | ICD-10-CM | POA: Diagnosis not present

## 2017-10-09 DIAGNOSIS — I10 Essential (primary) hypertension: Secondary | ICD-10-CM

## 2017-10-09 DIAGNOSIS — M1A09X1 Idiopathic chronic gout, multiple sites, with tophus (tophi): Secondary | ICD-10-CM

## 2017-10-09 MED ORDER — PREDNISONE 10 MG PO TABS: ORAL_TABLET | ORAL | 0 refills | Status: DC

## 2017-10-09 MED ORDER — FEBUXOSTAT 40 MG PO TABS: 40.0000 mg | ORAL_TABLET | Freq: Every day | ORAL | 1 refills | Status: DC

## 2017-10-09 MED ORDER — MITIGARE 0.6 MG PO CAPS: 1.0000 | ORAL_CAPSULE | Freq: Two times a day (BID) | ORAL | 2 refills | Status: DC

## 2017-10-09 NOTE — Progress Notes (Signed)
Samantha Pearson  MRN: [id number] DOB: September 27, 1960  PCP: Morrell Riddle, PA-C  Chief Complaint  Patient presents with  . Gout    3 week follow up    Subjective:  Pt presents to clinic for problems with acute gout flair.  She has been on the uloric for a month.  She is tolerating well.  Over the last week she has been in an acute gout flair.  She is on colchicine bid since June.  She has been using herbal stuff for pain medication - she does not want to be "hooked" on pain medications.  She is unable to work because she can not currently wear closed toed shoes and she needs a note that states this.  Allopurinol - nausea  History is obtained by patient.  Review of Systems  Constitutional: Negative for chills and fever.  Musculoskeletal: Positive for gait problem (2nd to pain) and joint swelling (worse area is her right 1st MTP).    Patient Active Problem List   Diagnosis Date Noted  . 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 Left knee pain 03/14/2016  . Class 3 obesity due to excess calories with serious comorbidity and body mass index (BMI) of 40.0 to 44.9 in adult 08/18/2013  . Stress 05/22/2011  . Essential hypertension 08/07/2008    Current Outpatient Medications on File Prior to Visit  Medication Sig Dispense Refill  . Glucosamine-Chondroitin (GLUCOSAMINE CHONDR COMPLEX PO) Take by mouth daily.    Marland Kitchen olmesartan (BENICAR) 40 MG tablet Take 1 tablet (40 mg total) by mouth daily. 90 tablet 0  . spironolactone (ALDACTONE) 50 MG tablet TAKE 1 TABLET EVERY DAY (DO NOT TAKE POTASSIUM SUPPLEMENT WITH THIS MEDICATION) 90 tablet 1  . Turmeric POWD 1,950 mg by Does not apply route 3 (three) times daily.     No current facility-administered medications on file prior to visit.     Allergies  Allergen Reactions  . Doxycycline Hives, Itching and Swelling  . Norvasc [Amlodipine Besylate] Swelling    Feet swelling  . Septra [Bactrim] Hives, Itching and  Swelling  . Latex Swelling and Rash    Past Medical History:  Diagnosis Date  . Allergy   . Anxiety   . Arthritis   . Chest pain 08/03/08   Urgent care visit  . Depression   . Dyspnea   . Edema   . Heart murmur   . Hypertension   . 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 Smoking status: Never Smoker  . Smokeless tobacco: Never Used  . Tobacco comment: Nonsmoker  Substance Use Topics  . Alcohol use: No    Alcohol/week: 0.0 standard drinks  . Drug use: No   family history includes Cancer (age of onset: 58) in her mother; Colon cancer in her maternal uncle; Colon polyps in her mother; Heart failure in her father; Hypertension in her  brother, sister, sister, sister, and sister; Prostate cancer in her father.     Objective:  BP 128/62 (BP Location: Left Arm, Patient Position: Sitting, Cuff Size: Large)   Pulse 97   Temp 98.9 F (37.2 C) (Oral)   Resp 16   Ht 4\' 11"  (1.499 m)   Wt 204 lb 3.2 oz (92.6 kg)   LMP 04/09/2014   SpO2 98%   BMI 41.24 kg/m  Body mass index is 41.24 kg/m.  Wt Readings from Last 3 Encounters:  10/09/17 204 lb 3.2 oz (92.6 kg)  09/14/17 216 lb 6.4 oz (98.2 kg)  08/16/17 214 lb 6.4 oz (97.3 kg)    Physical Exam  Constitutional: She is oriented to person, place, and time. [name].  HENT:  Head: Normocephalic and atraumatic.  Right Ear: Hearing and external ear normal.  Left Ear: Hearing and external ear normal.  Eyes: Conjunctivae are normal.  Neck: Normal range of motion.    Pulmonary/Chest: Effort normal.  Musculoskeletal:       Feet:  Bilateral fingers swollen with mild erythema over distal MCP bilateral index fingers  Neurological: She is alert and oriented to person, place, and time.  Skin: Skin is warm, dry and intact.  Psychiatric: She has a normal mood and affect. [name]. Judgment and thought content normal.  Vitals reviewed.   Assessment and Plan :  Idiopathic chronic gout of multiple sites with tophus  Idiopathic chronic gout of multiple sites without tophus - Plan: febuxostat (ULORIC) 40 MG tablet, MITIGARE 0.6 MG CAPS  Acute gout involving toe of right foot, unspecified cause - Plan: predniSONE (DELTASONE) 10 MG tablet   Pt is having an acute flair of her gout - she has been taking her preventative medications as Rx.  We will start a 16day prednisone taper.  She has a referral in for [facility] rheumatology for chronic care.  When she gets these flairs in the future an injection could be really helpful for the patient.  She declined pain medications today.  Patient verbalized to me that they understand the following: diagnosis, what is being done for them, what to expect and what should be done at home.  Their questions have been answered.  See after visit summary for patient specific instructions.  Benny Lennert PA-C  Primary Care at [facility] 10/09/2017 1:59 PM  Please note: Portions of this report may have been transcribed using dragon voice recognition software. Every effort was made to ensure accuracy; however, inadvertent computerized transcription errors may be present.

## 2017-10-09 NOTE — Patient Instructions (Addendum)
Continue colchicine and uloric - I wants to see you in 4 weeks at the other office.  Samantha Pearson [facility] - [address] [place], [place], [place] [address] Phone: [phone]   If you have lab work done today you will be contacted with your lab results within the next 2 weeks.  If you have not heard from [place] then please contact us. The fastest way to get your results is to register for My Chart.   IF you received an x-ray today, you will receive an invoice from [facility]. Please contact [facility] at [phone] with questions or concerns regarding your invoice.   IF you received labwork today, you will receive an invoice from [place]. Please contact [facility] at [phone] with questions or concerns regarding your invoice.   Our billing staff will not be able to assist you with questions regarding bills from these companies.  You will be contacted with the lab results as soon as they are available. The fastest way to get your results is to activate your My Chart account. Instructions are located on the last page of this paperwork. If you have not heard from [place] regarding the results in 2 weeks, please contact this office.

## 2017-10-18 ENCOUNTER — Telehealth: Payer: Self-pay | Admitting: Cardiology

## 2017-10-18 NOTE — Telephone Encounter (Signed)
Returned call to patient. She needs to r/s sleep study for a different date. She states she cannot do the sleep study until December. Will [place] to [place]  She would like to know if MD would like to see her after sleep study, in which case she will need to r/s her 11/25 appointment. Will routed to MD/RN

## 2017-10-18 NOTE — Telephone Encounter (Signed)
New message    Patient wants to know if she needs to see her concerning her sleep study results for her next appt 12/31/2017? The patient wants to know if this is a f/u visit to recheck her heart. Please call to discuss.

## 2017-10-19 NOTE — Telephone Encounter (Signed)
Left message on cell # (ok per Dpr) she can [facility] to reschedule her appointment.(number provided). Also left message that she is to keep her November appointment with Samantha Pearson. If her sleep study is positive for sleep apnea a different MD will be managing this. If not we can just call her with the results. Call me back if she has any further questions and/or concerns. My direct number was provided to her.

## 2017-10-29 ENCOUNTER — Telehealth: Payer: Self-pay | Admitting: Physician Assistant

## 2017-10-29 NOTE — Telephone Encounter (Signed)
Patient needs provider statement completed by Benny LennertSarah Weber for her recent OV for gout. I have completed what I could from the OV notes and highlighted the areas I was not sure about. I will place the forms in Sarah's box at 104 on 10/29/17 please return to the FMLA/Disability desk within 5-7 business days. Thank you!

## 2017-10-31 ENCOUNTER — Other Ambulatory Visit: Payer: Self-pay | Admitting: Physician Assistant

## 2017-10-31 DIAGNOSIS — M1A09X0 Idiopathic chronic gout, multiple sites, without tophus (tophi): Secondary | ICD-10-CM

## 2017-10-31 NOTE — Telephone Encounter (Signed)
done

## 2017-10-31 NOTE — Telephone Encounter (Signed)
febuxostat (Uloric) refill Last Refill:10/09/17 #30 1 RF Last OV: 10/09/17  PCP: Benny Lennert PA Pharmacy:[facility] [address]  Pt is requesting 90 day refill per pharmacy.

## 2017-11-01 NOTE — Telephone Encounter (Signed)
Paperwork scanned and faxed on 11/01/17 °

## 2017-11-07 ENCOUNTER — Encounter (HOSPITAL_BASED_OUTPATIENT_CLINIC_OR_DEPARTMENT_OTHER): Payer: BLUE CROSS/BLUE SHIELD

## 2017-11-08 ENCOUNTER — Ambulatory Visit: Payer: BLUE CROSS/BLUE SHIELD | Admitting: Cardiology

## 2017-11-29 ENCOUNTER — Encounter: Payer: Self-pay | Admitting: Family Medicine

## 2017-11-29 ENCOUNTER — Other Ambulatory Visit: Payer: Self-pay

## 2017-11-29 ENCOUNTER — Ambulatory Visit: Payer: BLUE CROSS/BLUE SHIELD | Admitting: Family Medicine

## 2017-11-29 VITALS — BP 124/84 | HR 89 | Temp 98.1°F | Ht 59.0 in | Wt 217.4 lb

## 2017-11-29 DIAGNOSIS — Z6841 Body Mass Index (BMI) 40.0 and over, adult: Secondary | ICD-10-CM | POA: Diagnosis not present

## 2017-11-29 DIAGNOSIS — I10 Essential (primary) hypertension: Secondary | ICD-10-CM

## 2017-11-29 DIAGNOSIS — M1A9XX1 Chronic gout, unspecified, with tophus (tophi): Secondary | ICD-10-CM

## 2017-11-29 DIAGNOSIS — Z1231 Encounter for screening mammogram for malignant neoplasm of breast: Secondary | ICD-10-CM | POA: Diagnosis not present

## 2017-11-29 LAB — HM MAMMOGRAPHY

## 2017-11-29 NOTE — Progress Notes (Signed)
Subjective:  By signing my name below, I, Samantha Pearson, attest that this documentation has been prepared under the direction and in the presence of Samantha Staggers, MD. Electronically Signed: Stann Pearson, Scribe. 11/29/2017 , 5:00 PM .  Patient was seen in Room 1 .   Patient ID: Samantha Pearson, female    DOB: 01/08/1961, 57 y.o.   MRN: [id number] Chief Complaint  Patient presents with  . Gout    transfer of care from Benny Lennert   HPI Samantha Pearson is a 57 y.o. female  She is a new patient to me, transferring care from Benny Lennert. She has a history of gout, last seen on Sept 3rd. She had been on Uloric for 1 month at that time as well as colchicine bid since June. She was taking herbal medication to treat pain. She was having difficulty working due to closed toed shoes requirement. She had acute gout flare in her toes with pain at the right first MTP as well as left MTP. She was treated with prednisone 16 day taper, and referred previously to [facility]ology for chronic gout care.   Gout Patient states she was seen by rheumatologist, Dr. Kathi Ludwig, but would like to see another rheumatologist because "she was just piggy-backing off of what Maralyn Sago was saying." Over the past year, she's had 3 gout flares. She's had gout history for about 1.5 years now. She's been taking Mitigare bid, but hasn't been taking Uloric due to cost. Her last dose of Uloric was last Friday (6 days ago). Her last dose of prednisone was about a week ago. She's been taking tart cherry and changed her diet. She denies any recent flare; last flare was about 2 weeks ago. She mentions some trouble in her left foot and also tophus in right foot. She's been back to work since Sept 11th.   HTN She takes benicar 40 mg qd and spironolactone 50 mg qd.   BP Readings from Last 3 Encounters:  11/29/17 124/84  10/09/17 128/62  09/14/17 (!) 160/76   Lab Results  Component Value Date   CREATININE 1.16 (H) 09/14/2017     Patient Active Problem List   Diagnosis Date Noted  . 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 Left knee pain 03/14/2016  . Class 3 obesity due to excess calories with serious comorbidity and body mass index (BMI) of 40.0 to 44.9 in adult 08/18/2013  . Stress 05/22/2011  . Essential hypertension 08/07/2008   Past Medical History:  Diagnosis Date  . Allergy   . Anxiety   . Arthritis   . Chest pain 08/03/08   Urgent care visit  . Depression   . Dyspnea   . Edema   . Heart murmur   . Hypertension   . Obesity    Past Surgical History:  Procedure Laterality Date  . CESAREAN SECTION    . DILATION AND CURETTAGE OF UTERUS     laparoscopic   Allergies  Allergen Reactions  . Doxycycline Hives, Itching and Swelling  . Norvasc [Amlodipine Besylate] Swelling    Feet swelling  . Septra [Bactrim] Hives, Itching and Swelling  . Latex Swelling and Rash   Prior to Admission medications   Medication Sig Start Date End Date Taking? Authorizing Provider  chlorthalidone (HYGROTON) 25 MG tablet TAKE 1 TABLET BY MOUTH EVERY DAY 10/10/17   Weber, Dema Severin, PA-C  febuxostat (ULORIC) 40 MG tablet Take 1 tablet (40 mg total) by mouth daily.  Pt did not tolerate allopurinol 10/09/17   Weber, Dema Severin, PA-C  Glucosamine-Chondroitin (GLUCOSAMINE CHONDR COMPLEX PO) Take by mouth daily.    [provider]  MITIGARE 0.6 MG CAPS Take 1 capsule by mouth 2 (two) times daily. 10/09/17   Weber, Dema Severin, PA-C  olmesartan (BENICAR) 40 MG tablet Take 1 tablet (40 mg total) by mouth daily. 09/14/17   Valarie Cones, Dema Severin, PA-C  predniSONE (DELTASONE) 10 MG tablet 6-5-4-4-3-3-3-3--2-2-2-2-1-1-1-1 taper take the pills for that day in the am with food 10/09/17   Weber, Dema Severin, PA-C  spironolactone (ALDACTONE) 50 MG tablet TAKE 1 TABLET EVERY DAY (DO NOT TAKE POTASSIUM SUPPLEMENT WITH THIS MEDICATION) 09/14/17   Valarie Cones, Dema Severin, PA-C  Turmeric POWD 1,950 mg by Does not apply route 3 (three) times  daily.    [provider]   Social History   Socioeconomic History  . Marital status: Single    Spouse name: Not on file  . Number of children: 1  . Years of education: Not on file  . Highest education level: Not on file  Occupational History  . Occupation: 3 jobs    Comment: International aid/development worker  Social Needs  . Financial resource strain: Not on file  . Food insecurity:    Worry: Not on file    Inability: Not on file  . Transportation needs:    Medical: Not on file    Non-medical: Not on file  Tobacco Use  . Smoking status: Never Smoker  . Smokeless tobacco: Never Used  . Tobacco comment: Nonsmoker  Substance and Sexual Activity  . Alcohol use: No    Alcohol/week: 0.0 standard drinks  . Drug use: No  . Sexual activity: Yes    Birth control/protection: None  Lifestyle  . Physical activity:    Days per week: Not on file    Minutes per session: Not on file  . Stress: Not on file  Relationships  . Social connections:    Talks on phone: Not on file    Gets together: Not on file    Attends religious service: Not on file    Active member of club or organization: Not on file    Attends meetings of clubs or organizations: Not on file    Relationship status: Not on file  . Intimate partner violence:    Fear of current or ex partner: Not on file    Emotionally abused: Not on file    Physically abused: Not on file    Forced sexual activity: Not on file  Other Topics Concern  . 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  Respiratory: Negative for cough.   Gastrointestinal: Negative for constipation, diarrhea, nausea and vomiting.  Musculoskeletal: Positive for arthralgias.  Skin: Negative for rash and wound.  Neurological: Negative for dizziness, weakness and headaches.       Objective:  Physical Exam  Constitutional: She is oriented to person, place, and time. [name]. No distress.  HENT:  Head: Normocephalic and atraumatic.  Eyes: Pupils are equal, round, and reactive to light. EOM are normal.  Neck: Neck supple.  Cardiovascular: Normal rate.  Pulmonary/Chest: Effort normal. No respiratory distress.  Musculoskeletal: Normal range of motion.  Left hand: some prominence of PIP in 4th finger, as well as DIP of index and middle fingers Right hand: prominence of DIP in index and ring fingers MTP's non tender  Neurological: She is alert and oriented to person, place, and time.  Skin: Skin is warm and dry.  Psychiatric: She has a normal mood and affect. [name].  Nursing note and vitals reviewed.   Vitals:   11/29/17 1552  BP: 124/84  Pulse: 89  Temp: 98.1 F (36.7 C)  TempSrc: Oral  SpO2: 99%  Weight: 217 lb 6.4 oz (98.6 kg)  Height: 4\' 11"  (1.499 m)       Assessment & Plan:   CATHERYNE DEFORD is a 57 y.o. female Chronic gout with tophus, unspecified cause, unspecified site - Plan: Uric Acid, Ambulatory referral to Rheumatology  -Still agree with rheumatology follow-up to decide on appropriate medication regimen given tophi and prior symptoms.  Uloric currently cost prohibitive, recommend she call rheumatology to determine if vouchers/samples or change in medication needed.   -Check uric acid as not in flare currently.  Essential hypertension  -  Stable, tolerating current regimen.  Plan on physical next few months.  No orders of the defined types were placed in this encounter.  Patient Instructions      I would like you to follow up with rheumatology to decide on treatment plan for gout. I will check uric acid today, but again would recommend further med discussion with rheumatology. I have placed a referral for second opinion, but please call current rheumatologist to see if they have discount cards or other recommendations for meds if Uloric is cost prohibitive.   Pleas follow up in next 2 months for a physical.   Thanks for coming in today.   If you have lab work done today you will be contacted with your lab results within the next 2 weeks.  If you have not heard from [place] then please contact us. The fastest way to get your results is to register for My Chart.   IF you received an x-ray today, you will receive an invoice from [facility]. Please contact [facility] at [phone] with questions or concerns regarding your invoice.   IF you received labwork today, you will receive an invoice from [place]. Please contact [facility] at [phone] with questions or concerns regarding your invoice.   Our billing staff will not be able to assist you with questions regarding bills from these companies.  You will be contacted with the lab results as soon as they are available. The fastest way to get your results is to activate your My Chart account. Instructions are located on the last page of this paperwork. If you have not heard from [place] regarding the results in 2 weeks, please contact this office.       I personally performed the services described in this documentation, which was scribed in my presence. The recorded information has been reviewed and considered for accuracy and completeness, addended by me as needed, and agree with information above.  Signed,   Samantha Staggers, MD Primary Care at [facility].  11/29/17 6:47 PM

## 2017-11-29 NOTE — Patient Instructions (Addendum)
   I would like you to follow up with rheumatology to decide on treatment plan for gout. I will check uric acid today, but again would recommend further med discussion with rheumatology. I have placed a referral for second opinion, but please call current rheumatologist to see if they have discount cards or other recommendations for meds if Uloric is cost prohibitive.   Pleas follow up in next 2 months for a physical.   Thanks for coming in today.   If you have lab work done today you will be contacted with your lab results within the next 2 weeks.  If you have not heard from [place] then please contact us. The fastest way to get your results is to register for My Chart.   IF you received an x-ray today, you will receive an invoice from [facility]. Please contact [facility] at [phone] with questions or concerns regarding your invoice.   IF you received labwork today, you will receive an invoice from [place]. Please contact [facility] at [phone] with questions or concerns regarding your invoice.   Our billing staff will not be able to assist you with questions regarding bills from these companies.  You will be contacted with the lab results as soon as they are available. The fastest way to get your results is to activate your My Chart account. Instructions are located on the last page of this paperwork. If you have not heard from [place] regarding the results in 2 weeks, please contact this office.

## 2017-11-30 LAB — URIC ACID: Uric Acid: 7.3 mg/dL — ABNORMAL HIGH (ref 2.5–7.1)

## 2017-12-20 ENCOUNTER — Encounter: Payer: Self-pay | Admitting: *Deleted

## 2017-12-31 ENCOUNTER — Ambulatory Visit: Payer: BLUE CROSS/BLUE SHIELD | Admitting: Cardiology

## 2017-12-31 ENCOUNTER — Encounter: Payer: Self-pay | Admitting: Cardiology

## 2017-12-31 VITALS — BP 138/68 | HR 77 | Ht 59.0 in | Wt 216.2 lb

## 2017-12-31 DIAGNOSIS — I10 Essential (primary) hypertension: Secondary | ICD-10-CM

## 2017-12-31 DIAGNOSIS — Z6841 Body Mass Index (BMI) 40.0 and over, adult: Secondary | ICD-10-CM

## 2017-12-31 DIAGNOSIS — Z713 Dietary counseling and surveillance: Secondary | ICD-10-CM

## 2017-12-31 DIAGNOSIS — Z7189 Other specified counseling: Secondary | ICD-10-CM | POA: Diagnosis not present

## 2017-12-31 NOTE — Progress Notes (Signed)
Cardiology Office Note:    Date:  12/31/2017   ID:  Samantha Pearson, DOB 1960/11/01, MRN [id number]  PCP:  Patient, No Pcp Per  Cardiologist: Jodelle Red, MD PhD  Referring MD: Morrell Riddle, PA-C   CC: follow up  History of Present Illness:    Samantha Pearson is a 57 y.o. female with a hx of hypertension and obesity who is seen in follow up today.   She was initially seen as as a new consult on 08/16/17 for evaluation and management of elevated BNP and shortness of breath. Noted initial symptoms of orthopnea and increased swelling in 06/2017. It was shortly after she stopped chlorthalidone because of issues with uric acid and gout. She was started on spironolactone and an ARB with improvement. Gradually over time her symptoms have improved, but she still has some shortness of breath with exertion. Also endorsed palpitations and fatigue.   Echo done 08/2017 showed mild diastolic dysfunction. She is pending a sleep study.  Concerns today: Had gout flare 10/2017, was treated with prednisone. Referred to rheumatology. Has been on and off prednisone recently. Shortness of breath, LE edema improved from prior. Has been trying to work as much as she can at her two jobs, is active/on her feet at both jobs. No chest pain, PND, orthopnea, or syncope. Tolerating her medications.  Past Medical History:  Diagnosis Date  . Allergy   . Anxiety   . Arthritis   . Chest pain 08/03/08   Urgent care visit  . Depression   . Dyspnea   . Edema   . Heart murmur   . Hypertension   . Obesity     Past Surgical History:  Procedure Laterality Date  . CESAREAN SECTION    . DILATION AND CURETTAGE OF UTERUS     laparoscopic    Current Medications: Current Outpatient Medications on File Prior to Visit  Medication Sig  . MITIGARE 0.6 MG CAPS Take 1 capsule by mouth 2 (two) times daily.  Marland Kitchen olmesartan (BENICAR) 40 MG tablet Take 1 tablet (40 mg total) by mouth daily.  . predniSONE (DELTASONE) 5 MG  tablet TAKE 1 TABLET BY MOUTH EVERY DAY WITH FOOD OR MILK  . spironolactone (ALDACTONE) 50 MG tablet TAKE 1 TABLET EVERY DAY (DO NOT TAKE POTASSIUM SUPPLEMENT WITH THIS MEDICATION)  . Turmeric POWD 1,950 mg by Does not apply route 3 (three) times daily.   No current facility-administered medications on file prior to visit.      Allergies:   Doxycycline; Norvasc [amlodipine besylate]; Septra [bactrim]; and Latex   Social History   Socioeconomic History  . Marital status: Single    Spouse name: Not on file  . Number of children: 1  . Years of education: Not on file  . Highest education level: Not on file  Occupational History  . Occupation: 3 jobs    Comment: International aid/development worker  Social Needs  . Financial resource strain: Not on file  . Food insecurity:    Worry: Not on file    Inability: Not on file  . Transportation needs:    Medical: Not on file    Non-medical: Not on file  Tobacco Use  . Smoking status: Never Smoker  . Smokeless tobacco: Never Used  . Tobacco comment: Nonsmoker  Substance and Sexual Activity  . Alcohol use: No    Alcohol/week: 0.0 standard drinks  . Drug use: No  . Sexual activity: Yes    Birth control/protection: None  Lifestyle  . Physical activity:    Days per week: Not on file    Minutes per session: Not on file  . Stress: Not on file  Relationships  . Social connections:    Talks on phone: Not on file    Gets together: Not on file    Attends religious service: Not on file    Active member of club or organization: Not on file    Attends meetings of clubs or organizations: Not on file    Relationship status: Not on file  Other Topics Concern  . 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 There is no history of Esophageal cancer, Stomach cancer, or Rectal cancer. Father just passed away, funeral is tomorrow.  ROS:   Please see the history of present illness.  Additional pertinent ROS: Review of Systems  Constitutional: Positive for malaise/fatigue. Negative for chills and fever.  HENT: Negative for ear pain and hearing loss.   Eyes: Negative for double vision and pain.  Respiratory: Negative for cough, shortness of breath and wheezing.   Cardiovascular: Negative for chest pain, palpitations, orthopnea, claudication, leg swelling and PND.  Gastrointestinal: Negative for abdominal pain, blood in stool and melena.  Genitourinary: Negative for hematuria.  Musculoskeletal: Positive for joint pain and myalgias. Negative for falls.  Skin: Negative for rash.  Neurological: Negative for focal weakness and loss of consciousness.  Endo/Heme/Allergies: Does not bruise/bleed easily.   EKGs/Labs/Other Studies Reviewed:    The following studies were personally reviewed today: Echo 08/30/17 Study Conclusions  - Left ventricle: The cavity size was normal. Wall thickness was   normal. Systolic function was normal. The estimated ejection   fraction was in the range of 50% to 55%. Wall motion was normal;   there were no regional wall motion abnormalities. Doppler   parameters are consistent with abnormal left ventricular   relaxation (grade 1 diastolic dysfunction).  Impressions:  - Normal LV systolic function; mild diastolic dysfunction.  EKG:  EKG is personally  reviewed today.  The ekg ordered previously demonstrates normal sinus rhythm  Recent Labs: 06/05/2017: ALT 20 06/15/2017: Hemoglobin 10.9; Platelets 280 06/22/2017: BNP 72.2 09/14/2017: BUN 17; Creatinine, Ser 1.16; Potassium 4.1; Sodium 144  Recent Lipid Panel    Component Value Date/Time   CHOL 226 (A) 11/27/2016   TRIG 217 (H) 11/27/2015 1538   HDL 54 11/27/2016   CHOLHDL 3.5 11/27/2015 1538   VLDL 43 (H) 11/27/2015 1538   LDLCALC 98 11/27/2015 1538    Physical Exam:    VS:  BP 138/68   Pulse 77   Ht 4\' 11"  (1.499 m)   Wt 216 lb 3.2 oz (98.1 kg)   LMP 04/09/2014  SpO2 99%   BMI 43.67 kg/m     Wt Readings from Last 3 Encounters:  12/31/17 216 lb 3.2 oz (98.1 kg)  11/29/17 217 lb 6.4 oz (98.6 kg)  10/09/17 204 lb 3.2 oz (92.6 kg)     GEN: Well nourished, well developed in no acute distress HEENT: Normal NECK: No JVD; No carotid bruits LYMPHATICS: No lymphadenopathy CARDIAC: regular rhythm, normal S1 and S2, no rubs, gallops. 1/6 systolic flow murmur at RUSB. Radial and DP pulses 2+ bilaterally RESPIRATORY:  Clear to auscultation without rales, wheezing or rhonchi  ABDOMEN: Soft, non-tender, non-distended MUSCULOSKELETAL:  No edema; No deformity  SKIN: Warm and dry NEUROLOGIC:  Alert and oriented x 3 PSYCHIATRIC:  Normal affect   ASSESSMENT:    1. Essential hypertension   2. Class 3 severe obesity due to excess calories with serious comorbidity and body mass index (BMI) of 40.0 to 44.9 in adult (HCC)   3. Counseling on health promotion and disease prevention   4. Nutritional counseling    PLAN:    Shortness of breath, LE edema: resolved  2. At risk for sleep apnea: pending home sleep study in December, as discussed at prior visit.  3. Hypertension: within range on prednisone -continue spironolactone and olmesartan -counseled on fluid retention on prednisone, avoidance of additional salt.  4. Prevention and health counseling: -recommend heart  healthy/Mediterranean diet, with whole grains, fruits, vegetable, fish, lean meats, nuts, and olive oil. Limit salt. -recommend moderate walking, 3-5 times/week for 30-50 minutes each session. Aim for at least 150 minutes.week. Goal should be pace of 3 miles/hours, or walking 1.5 miles in 30 minutes -recommend avoidance of tobacco products. Avoid excess alcohol. -Additional risk factor control:  -Diabetes: A1c is 5.6  -Lipids: LDL 98, HDL 54, TG 217  -Blood pressure control: likely worsened with prednisone  -Weight: BMI 43.7. Class 3 severe obesity. Counseled on diet today, given information for both Mediterranean diet and foods to avoid with gout. Offered referral to nutrition in the future if that would be helpful. She will consider. -ASCVD risk score: The 10-year ASCVD risk score Denman George DC Montez Hageman., et al., 2013) is: 3.9%*   Values used to calculate the score:     Age: 4 years     Sex: Female     Is Non-Hispanic African American: No     Diabetic: No     Tobacco smoker: No     Systolic Blood Pressure: 138 mmHg     Is BP treated: Yes     HDL Cholesterol: 54 mg/dL*     Total Cholesterol: 226 mg/dL*     * - Cholesterol units were assumed for this score calculation   Follow up in 1 year or sooner PRN  TIME SPENT WITH PATIENT: >25  minutes of direct patient care. More than 50% of that time was spent on coordination of care and counseling regarding hypertension, nutrition.  Jodelle Red, MD, PhD [place]  [facility]   Medication Adjustments/Labs and Tests Ordered: Current medicines are reviewed at length with the patient today.  Concerns regarding medicines are outlined above.  No orders of the defined types were placed in this encounter.  No orders of the defined types were placed in this encounter.   Patient Instructions  Medication Instructions:  Your Physician recommend you continue on your current medication as directed.    If you need a refill on your cardiac  medications before your next appointment, please call your pharmacy.   Lab work:  None  Testing/Procedures: None  Follow-Up: At [facility], you and your health needs are our priority.  As part of our continuing mission to provide you with exceptional heart care, we have created designated Provider Care Teams.  These Care Teams include your primary Cardiologist (physician) and Advanced Practice Providers (APPs -  Physician Assistants and Nurse Practitioners) who all work together to provide you with the care you need, when you need it. You will need a follow up appointment in 1 years.  Please call our office 2 months in advance to schedule this appointment.  You may see Jodelle Red, MD or one of the following Advanced Practice Providers on your designated Care Team:   Theodore Demark, PA-C . Joni Reining, DNP, ANP  Any Other Special Instructions Will Be Listed Below (If Applicable).      Mediterranean Diet A Mediterranean diet refers to food and lifestyle choices that are based on the traditions of countries located on the [place]. This way of eating has been shown to help prevent certain conditions and improve outcomes for people who have chronic diseases, like kidney disease and heart disease. What are tips for following this plan? Lifestyle  Cook and eat meals together with your family, when possible.  Drink enough fluid to keep your urine clear or pale yellow.  Be physically active every day. This includes: ? Aerobic exercise like running or swimming. ? Leisure activities like gardening, walking, or housework.  Get 7-8 hours of sleep each night.  If recommended by your health care provider, drink red wine in moderation. This means 1 glass a day for nonpregnant women and 2 glasses a day for men. A glass of wine equals 5 oz (150 mL). Reading food labels  Check the serving size of packaged foods. For foods such as rice and pasta, the serving size refers to  the amount of cooked product, not dry.  Check the total fat in packaged foods. Avoid foods that have saturated fat or trans fats.  Check the ingredients list for added sugars, such as corn syrup. Shopping  At the grocery store, buy most of your food from the areas near the walls of the store. This includes: ? Fresh fruits and vegetables (produce). ? Grains, beans, nuts, and seeds. Some of these may be available in unpackaged forms or large amounts (in bulk). ? Fresh seafood. ? Poultry and eggs. ? Low-fat dairy products.  Buy whole ingredients instead of prepackaged foods.  Buy fresh fruits and vegetables in-season from local farmers markets.  Buy frozen fruits and vegetables in resealable bags.  If you do not have access to quality fresh seafood, buy precooked frozen shrimp or canned fish, such as tuna, salmon, or sardines.  Buy small amounts of raw or cooked vegetables, salads, or olives from the deli or salad bar at your store.  Stock your pantry so you always have certain foods on hand, such as olive oil, canned tuna, canned tomatoes, rice, pasta, and beans. Cooking  Cook foods with extra-virgin olive oil instead of using butter or other vegetable oils.  Have meat as a side dish, and have vegetables or grains as your main dish. This means having meat in small portions or adding small amounts of meat to foods like pasta or stew.  Use beans or vegetables instead of meat in common dishes like chili or lasagna.  Experiment with different cooking methods. Try roasting or broiling vegetables instead of steaming or sauteing them.  Add frozen vegetables to soups,  stews, pasta, or rice.  Add nuts or seeds for added healthy fat at each meal. You can add these to yogurt, salads, or vegetable dishes.  Marinate fish or vegetables using olive oil, lemon juice, garlic, and fresh herbs. Meal planning  Plan to eat 1 vegetarian meal one day each week. Try to work up to 2 vegetarian meals,  if possible.  Eat seafood 2 or more times a week.  Have healthy snacks readily available, such as: ? Vegetable sticks with hummus. ? [place] yogurt. ? Fruit and nut trail mix.  Eat balanced meals throughout the week. This includes: ? Fruit: 2-3 servings a day ? Vegetables: 4-5 servings a day ? Low-fat dairy: 2 servings a day ? Fish, poultry, or lean meat: 1 serving a day ? Beans and legumes: 2 or more servings a week ? Nuts and seeds: 1-2 servings a day ? Whole grains: 6-8 servings a day ? Extra-virgin olive oil: 3-4 servings a day  Limit red meat and sweets to only a few servings a month What are my food choices?  Mediterranean diet ? Recommended ? Grains: Whole-grain pasta. Brown rice. Bulgar wheat. Polenta. Couscous. Whole-wheat bread. Orpah Cobb. ? Vegetables: Artichokes. Beets. Broccoli. Cabbage. Carrots. Eggplant. Green beans. Chard. Kale. Spinach. Onions. Leeks. Peas. Squash. Tomatoes. Peppers. Radishes. ? Fruits: Apples. Apricots. Avocado. Berries. Bananas. Cherries. Dates. Figs. Grapes. Lemons. Melon. Oranges. Peaches. Plums. Pomegranate. ? Meats and other protein foods: Beans. Almonds. Sunflower seeds. Pine nuts. Peanuts. Cod. Salmon. Scallops. Shrimp. Tuna. Tilapia. Clams. Oysters. Eggs. ? Dairy: Low-fat milk. Cheese. Greek yogurt. ? Beverages: Water. Red wine. Herbal tea. ? Fats and oils: Extra virgin olive oil. Avocado oil. Grape seed oil. ? Sweets and desserts: [place] yogurt with honey. Baked apples. Poached pears. Trail mix. ? Seasoning and other foods: Basil. Cilantro. Coriander. Cumin. Mint. Parsley. Sage. Rosemary. Tarragon. Garlic. Oregano. Thyme. Pepper. Balsalmic vinegar. Tahini. Hummus. Tomato sauce. Olives. Mushrooms. ? Limit these ? Grains: Prepackaged pasta or rice dishes. Prepackaged cereal with added sugar. ? Vegetables: Deep fried potatoes (french fries). ? Fruits: Fruit canned in syrup. ? Meats and other protein foods: Beef. Pork. Lamb. Poultry with  skin. Hot dogs. Tomasa Blase. ? Dairy: Ice cream. Sour cream. Whole milk. ? Beverages: Juice. Sugar-sweetened soft drinks. Beer. Liquor and spirits. ? Fats and oils: Butter. Canola oil. Vegetable oil. Beef fat (tallow). Lard. ? Sweets and desserts: Cookies. Cakes. Pies. Candy. ? Seasoning and other foods: Mayonnaise. Premade sauces and marinades. ? The items listed may not be a complete list. Talk with your dietitian about what dietary choices are right for you. Summary  The Mediterranean diet includes both food and lifestyle choices.  Eat a variety of fresh fruits and vegetables, beans, nuts, seeds, and whole grains.  Limit the amount of red meat and sweets that you eat.  Talk with your health care provider about whether it is safe for you to drink red wine in moderation. This means 1 glass a day for nonpregnant women and 2 glasses a day for men. A glass of wine equals 5 oz (150 mL). This information is not intended to replace advice given to you by your health care provider. Make sure you discuss any questions you have with your health care provider. Document Released: 09/16/2015 Document Revised: 10/19/2015 Document Reviewed: 09/16/2015 Elsevier Interactive Patient Education  2018 [employer].  Gout Gout is painful swelling that can occur in some of your joints. Gout is a type of arthritis. This condition is caused by having too  much uric acid in your body. Uric acid is a chemical that forms when your body breaks down substances called purines. Purines are important for building body proteins. When your body has too much uric acid, sharp crystals can form and build up inside your joints. This causes pain and swelling. Gout attacks can happen quickly and be very painful (acute gout). Over time, the attacks can affect more joints and become more frequent (chronic gout). Gout can also cause uric acid to build up under your skin and inside your kidneys. What are the causes? This condition is caused  by too much uric acid in your blood. This can occur because:  Your kidneys do not remove enough uric acid from your blood. This is the most common cause.  Your body makes too much uric acid. This can occur with some cancers and cancer treatments. It can also occur if your body is breaking down too many red blood cells (hemolytic anemia).  You eat too many foods that are high in purines. These foods include organ meats and some seafood. Alcohol, especially beer, is also high in purines.  A gout attack may be triggered by trauma or stress. What increases the risk? This condition is more likely to develop in people who:  Have a family history of gout.  Are female and middle-aged.  Are female and have gone through menopause.  Are obese.  Frequently drink alcohol, especially beer.  Are dehydrated.  Lose weight too quickly.  Have an organ transplant.  Have lead poisoning.  Take certain medicines, including aspirin, cyclosporine, diuretics, levodopa, and niacin.  Have kidney disease or psoriasis.  What are the signs or symptoms? An attack of acute gout happens quickly. It usually occurs in just one joint. The most common place is the big toe. Attacks often start at night. Other joints that may be affected include joints of the feet, ankle, knee, fingers, wrist, or elbow. Symptoms may include:  Severe pain.  Warmth.  Swelling.  Stiffness.  Tenderness. The affected joint may be very painful to touch.  Shiny, red, or purple skin.  Chills and fever.  Chronic gout may cause symptoms more frequently. More joints may be involved. You may also have white or yellow lumps (tophi) on your hands or feet or in other areas near your joints. How is this diagnosed? This condition is diagnosed based on your symptoms, medical history, and physical exam. You may have tests, such as:  Blood tests to measure uric acid levels.  Removal of joint fluid with a needle (aspiration) to look for  uric acid crystals.  X-rays to look for joint damage.  How is this treated? Treatment for this condition has two phases: treating an acute attack and preventing future attacks. Acute gout treatment may include medicines to reduce pain and swelling, including:  NSAIDs.  Steroids. These are strong anti-inflammatory medicines that can be taken by mouth (orally) or injected into a joint.  Colchicine. This medicine relieves pain and swelling when it is taken soon after an attack. It can be given orally or through an IV tube.  Preventive treatment may include:  Daily use of smaller doses of NSAIDs or colchicine.  Use of a medicine that reduces uric acid levels in your blood.  Changes to your diet. You may need to see a specialist about healthy eating (dietitian).  Follow these instructions at home: During a Gout Attack  If directed, apply ice to the affected area: ? Put ice in a plastic  bag. ? Place a towel between your skin and the bag. ? Leave the ice on for 20 minutes, 2-3 times a day.  Rest the joint as much as possible. If the affected joint is in your leg, you may be given crutches to use.  Raise (elevate) the affected joint above the level of your heart as often as possible.  Drink enough fluids to keep your urine clear or pale yellow.  Take over-the-counter and prescription medicines only as told by your health care provider.  Do not drive or operate heavy machinery while taking prescription pain medicine.  Follow instructions from your health care provider about eating or drinking restrictions.  Return to your normal activities as told by your health care provider. Ask your health care provider what activities are safe for you. Avoiding Future Gout Attacks  Follow a low-purine diet as told by your dietitian or health care provider. Avoid foods and drinks that are high in purines, including liver, kidney, anchovies, asparagus, herring, mushrooms, mussels, and  beer.  Limit alcohol intake to no more than 1 drink a day for nonpregnant women and 2 drinks a day for men. One drink equals 12 oz of beer, 5 oz of wine, or 1 oz of hard liquor.  Maintain a healthy weight or lose weight if you are overweight. If you want to lose weight, talk with your health care provider. It is important that you do not lose weight too quickly.  Start or maintain an exercise program as told by your health care provider.  Drink enough fluids to keep your urine clear or pale yellow.  Take over-the-counter and prescription medicines only as told by your health care provider.  Keep all follow-up visits as told by your health care provider. This is important. Contact a health care provider if:  You have another gout attack.  You continue to have symptoms of a gout attack after10 days of treatment.  You have side effects from your medicines.  You have chills or a fever.  You have burning pain when you urinate.  You have pain in your lower back or belly. Get help right away if:  You have severe or uncontrolled pain.  You cannot urinate. This information is not intended to replace advice given to you by your health care provider. Make sure you discuss any questions you have with your health care provider. Document Released: 01/21/2000 Document Revised: 07/01/2015 Document Reviewed: 11/05/2014 Elsevier Interactive Patient Education  2018 [employer].     Signed, Jodelle Red, MD PhD 12/31/2017 4:46 PM    [facility]

## 2017-12-31 NOTE — Patient Instructions (Addendum)
Medication Instructions:  Your Physician recommend you continue on your current medication as directed.    If you need a refill on your cardiac medications before your next appointment, please call your pharmacy.   Lab work: None  Testing/Procedures: None  Follow-Up: At [facility], you and your health needs are our priority.  As part of our continuing mission to provide you with exceptional heart care, we have created designated Provider Care Teams.  These Care Teams include your primary Cardiologist (physician) and Advanced Practice Providers (APPs -  Physician Assistants and Nurse Practitioners) who all work together to provide you with the care you need, when you need it. You will need a follow up appointment in 1 years.  Please call our office 2 months in advance to schedule this appointment.  You may see Jodelle Red, MD or one of the following Advanced Practice Providers on your designated Care Team:   Theodore Demark, PA-C . Joni Reining, DNP, ANP  Any Other Special Instructions Will Be Listed Below (If Applicable).      Mediterranean Diet A Mediterranean diet refers to food and lifestyle choices that are based on the traditions of countries located on the [place]. This way of eating has been shown to help prevent certain conditions and improve outcomes for people who have chronic diseases, like kidney disease and heart disease. What are tips for following this plan? Lifestyle  Cook and eat meals together with your family, when possible.  Drink enough fluid to keep your urine clear or pale yellow.  Be physically active every day. This includes: ? Aerobic exercise like running or swimming. ? Leisure activities like gardening, walking, or housework.  Get 7-8 hours of sleep each night.  If recommended by your health care provider, drink red wine in moderation. This means 1 glass a day for nonpregnant women and 2 glasses a day for men. A glass of wine  equals 5 oz (150 mL). Reading food labels  Check the serving size of packaged foods. For foods such as rice and pasta, the serving size refers to the amount of cooked product, not dry.  Check the total fat in packaged foods. Avoid foods that have saturated fat or trans fats.  Check the ingredients list for added sugars, such as corn syrup. Shopping  At the grocery store, buy most of your food from the areas near the walls of the store. This includes: ? Fresh fruits and vegetables (produce). ? Grains, beans, nuts, and seeds. Some of these may be available in unpackaged forms or large amounts (in bulk). ? Fresh seafood. ? Poultry and eggs. ? Low-fat dairy products.  Buy whole ingredients instead of prepackaged foods.  Buy fresh fruits and vegetables in-season from local farmers markets.  Buy frozen fruits and vegetables in resealable bags.  If you do not have access to quality fresh seafood, buy precooked frozen shrimp or canned fish, such as tuna, salmon, or sardines.  Buy small amounts of raw or cooked vegetables, salads, or olives from the deli or salad bar at your store.  Stock your pantry so you always have certain foods on hand, such as olive oil, canned tuna, canned tomatoes, rice, pasta, and beans. Cooking  Cook foods with extra-virgin olive oil instead of using butter or other vegetable oils.  Have meat as a side dish, and have vegetables or grains as your main dish. This means having meat in small portions or adding small amounts of meat to foods like pasta or stew.  Use beans or vegetables instead of meat in common dishes like chili or lasagna.  Experiment with different cooking methods. Try roasting or broiling vegetables instead of steaming or sauteing them.  Add frozen vegetables to soups, stews, pasta, or rice.  Add nuts or seeds for added healthy fat at each meal. You can add these to yogurt, salads, or vegetable dishes.  Marinate fish or vegetables using  olive oil, lemon juice, garlic, and fresh herbs. Meal planning  Plan to eat 1 vegetarian meal one day each week. Try to work up to 2 vegetarian meals, if possible.  Eat seafood 2 or more times a week.  Have healthy snacks readily available, such as: ? Vegetable sticks with hummus. ? [place] yogurt. ? Fruit and nut trail mix.  Eat balanced meals throughout the week. This includes: ? Fruit: 2-3 servings a day ? Vegetables: 4-5 servings a day ? Low-fat dairy: 2 servings a day ? Fish, poultry, or lean meat: 1 serving a day ? Beans and legumes: 2 or more servings a week ? Nuts and seeds: 1-2 servings a day ? Whole grains: 6-8 servings a day ? Extra-virgin olive oil: 3-4 servings a day  Limit red meat and sweets to only a few servings a month What are my food choices?  Mediterranean diet ? Recommended ? Grains: Whole-grain pasta. Brown rice. Bulgar wheat. Polenta. Couscous. Whole-wheat bread. [place] Cobbatmeal. Quinoa. ? Vegetables: Artichokes. Beets. Broccoli. Cabbage. Carrots. Eggplant. Green beans. Chard. Kale. Spinach. Onions. Leeks. Peas. Squash. Tomatoes. Peppers. Radishes. ? Fruits: Apples. Apricots. Avocado. Berries. Bananas. Cherries. Dates. Figs. Grapes. Lemons. Melon. Oranges. Peaches. Plums. Pomegranate. ? Meats and other protein foods: Beans. Almonds. Sunflower seeds. Pine nuts. Peanuts. Cod. Salmon. Scallops. Shrimp. Tuna. Tilapia. Clams. Oysters. Eggs. ? Dairy: Low-fat milk. Cheese. Greek yogurt. ? Beverages: Water. Red wine. Herbal tea. ? Fats and oils: Extra virgin olive oil. Avocado oil. Grape seed oil. ? Sweets and desserts: [place] yogurt with honey. Baked apples. Poached pears. Trail mix. ? Seasoning and other foods: Basil. Cilantro. Coriander. Cumin. Mint. Parsley. Sage. Rosemary. Tarragon. Garlic. Oregano. Thyme. Pepper. Balsalmic vinegar. Tahini. Hummus. Tomato sauce. Olives. Mushrooms. ? Limit these ? Grains: Prepackaged pasta or rice dishes. Prepackaged cereal with added  sugar. ? Vegetables: Deep fried potatoes (french fries). ? Fruits: Fruit canned in syrup. ? Meats and other protein foods: Beef. Pork. Lamb. Poultry with skin. Hot dogs. Tomasa BlaseBacon. ? Dairy: Ice cream. Sour cream. Whole milk. ? Beverages: Juice. Sugar-sweetened soft drinks. Beer. Liquor and spirits. ? Fats and oils: Butter. Canola oil. Vegetable oil. Beef fat (tallow). Lard. ? Sweets and desserts: Cookies. Cakes. Pies. Candy. ? Seasoning and other foods: Mayonnaise. Premade sauces and marinades. ? The items listed may not be a complete list. Talk with your dietitian about what dietary choices are right for you. Summary  The Mediterranean diet includes both food and lifestyle choices.  Eat a variety of fresh fruits and vegetables, beans, nuts, seeds, and whole grains.  Limit the amount of red meat and sweets that you eat.  Talk with your health care provider about whether it is safe for you to drink red wine in moderation. This means 1 glass a day for nonpregnant women and 2 glasses a day for men. A glass of wine equals 5 oz (150 mL). This information is not intended to replace advice given to you by your health care provider. Make sure you discuss any questions you have with your health care provider. Document Released: 09/16/2015 Document Revised: 10/19/2015 Document  Reviewed: 09/16/2015 Elsevier Interactive Patient Education  2018 [employer]  Gout Gout is painful swelling that can occur in some of your joints. Gout is a type of arthritis. This condition is caused by having too much uric acid in your body. Uric acid is a chemical that forms when your body breaks down substances called purines. Purines are important for building body proteins. When your body has too much uric acid, sharp crystals can form and build up inside your joints. This causes pain and swelling. Gout attacks can happen quickly and be very painful (acute gout). Over time, the attacks can affect more joints and become  more frequent (chronic gout). Gout can also cause uric acid to build up under your skin and inside your kidneys. What are the causes? This condition is caused by too much uric acid in your blood. This can occur because:  Your kidneys do not remove enough uric acid from your blood. This is the most common cause.  Your body makes too much uric acid. This can occur with some cancers and cancer treatments. It can also occur if your body is breaking down too many red blood cells (hemolytic anemia).  You eat too many foods that are high in purines. These foods include organ meats and some seafood. Alcohol, especially beer, is also high in purines.  A gout attack may be triggered by trauma or stress. What increases the risk? This condition is more likely to develop in people who:  Have a family history of gout.  Are female and middle-aged.  Are female and have gone through menopause.  Are obese.  Frequently drink alcohol, especially beer.  Are dehydrated.  Lose weight too quickly.  Have an organ transplant.  Have lead poisoning.  Take certain medicines, including aspirin, cyclosporine, diuretics, levodopa, and niacin.  Have kidney disease or psoriasis.  What are the signs or symptoms? An attack of acute gout happens quickly. It usually occurs in just one joint. The most common place is the big toe. Attacks often start at night. Other joints that may be affected include joints of the feet, ankle, knee, fingers, wrist, or elbow. Symptoms may include:  Severe pain.  Warmth.  Swelling.  Stiffness.  Tenderness. The affected joint may be very painful to touch.  Shiny, red, or purple skin.  Chills and fever.  Chronic gout may cause symptoms more frequently. More joints may be involved. You may also have white or yellow lumps (tophi) on your hands or feet or in other areas near your joints. How is this diagnosed? This condition is diagnosed based on your symptoms, medical  history, and physical exam. You may have tests, such as:  Blood tests to measure uric acid levels.  Removal of joint fluid with a needle (aspiration) to look for uric acid crystals.  X-rays to look for joint damage.  How is this treated? Treatment for this condition has two phases: treating an acute attack and preventing future attacks. Acute gout treatment may include medicines to reduce pain and swelling, including:  NSAIDs.  Steroids. These are strong anti-inflammatory medicines that can be taken by mouth (orally) or injected into a joint.  Colchicine. This medicine relieves pain and swelling when it is taken soon after an attack. It can be given orally or through an IV tube.  Preventive treatment may include:  Daily use of smaller doses of NSAIDs or colchicine.  Use of a medicine that reduces uric acid levels in your blood.  Changes to your  diet. You may need to see a specialist about healthy eating (dietitian).  Follow these instructions at home: During a Gout Attack  If directed, apply ice to the affected area: ? Put ice in a plastic bag. ? Place a towel between your skin and the bag. ? Leave the ice on for 20 minutes, 2-3 times a day.  Rest the joint as much as possible. If the affected joint is in your leg, you may be given crutches to use.  Raise (elevate) the affected joint above the level of your heart as often as possible.  Drink enough fluids to keep your urine clear or pale yellow.  Take over-the-counter and prescription medicines only as told by your health care provider.  Do not drive or operate heavy machinery while taking prescription pain medicine.  Follow instructions from your health care provider about eating or drinking restrictions.  Return to your normal activities as told by your health care provider. Ask your health care provider what activities are safe for you. Avoiding Future Gout Attacks  Follow a low-purine diet as told by your dietitian  or health care provider. Avoid foods and drinks that are high in purines, including liver, kidney, anchovies, asparagus, herring, mushrooms, mussels, and beer.  Limit alcohol intake to no more than 1 drink a day for nonpregnant women and 2 drinks a day for men. One drink equals 12 oz of beer, 5 oz of wine, or 1 oz of hard liquor.  Maintain a healthy weight or lose weight if you are overweight. If you want to lose weight, talk with your health care provider. It is important that you do not lose weight too quickly.  Start or maintain an exercise program as told by your health care provider.  Drink enough fluids to keep your urine clear or pale yellow.  Take over-the-counter and prescription medicines only as told by your health care provider.  Keep all follow-up visits as told by your health care provider. This is important. Contact a health care provider if:  You have another gout attack.  You continue to have symptoms of a gout attack after10 days of treatment.  You have side effects from your medicines.  You have chills or a fever.  You have burning pain when you urinate.  You have pain in your lower back or belly. Get help right away if:  You have severe or uncontrolled pain.  You cannot urinate. This information is not intended to replace advice given to you by your health care provider. Make sure you discuss any questions you have with your health care provider. Document Released: 01/21/2000 Document Revised: 07/01/2015 Document Reviewed: 11/05/2014 Elsevier Interactive Patient Education  [employer].

## 2018-01-14 ENCOUNTER — Other Ambulatory Visit: Payer: Self-pay

## 2018-01-14 ENCOUNTER — Ambulatory Visit (INDEPENDENT_AMBULATORY_CARE_PROVIDER_SITE_OTHER): Payer: BLUE CROSS/BLUE SHIELD | Admitting: Emergency Medicine

## 2018-01-14 ENCOUNTER — Encounter: Payer: Self-pay | Admitting: Emergency Medicine

## 2018-01-14 VITALS — BP 126/76 | HR 70 | Temp 98.4°F | Resp 16 | Ht 58.75 in | Wt 218.2 lb

## 2018-01-14 DIAGNOSIS — I10 Essential (primary) hypertension: Secondary | ICD-10-CM | POA: Diagnosis not present

## 2018-01-14 DIAGNOSIS — Z13220 Encounter for screening for lipoid disorders: Secondary | ICD-10-CM | POA: Diagnosis not present

## 2018-01-14 DIAGNOSIS — Z13228 Encounter for screening for other metabolic disorders: Secondary | ICD-10-CM | POA: Diagnosis not present

## 2018-01-14 DIAGNOSIS — Z130 Encounter for screening for diseases of the blood and blood-forming organs and certain disorders involving the immune mechanism: Secondary | ICD-10-CM

## 2018-01-14 DIAGNOSIS — Z0001 Encounter for general adult medical examination with abnormal findings: Secondary | ICD-10-CM | POA: Diagnosis not present

## 2018-01-14 DIAGNOSIS — Z0000 Encounter for general adult medical examination without abnormal findings: Secondary | ICD-10-CM

## 2018-01-14 DIAGNOSIS — Z1329 Encounter for screening for other suspected endocrine disorder: Secondary | ICD-10-CM

## 2018-01-14 DIAGNOSIS — Z8739 Personal history of other diseases of the musculoskeletal system and connective tissue: Secondary | ICD-10-CM

## 2018-01-14 LAB — CBC WITH DIFFERENTIAL/PLATELET
BASOS ABS: 0 10*3/uL (ref 0.0–0.2)
Basos: 1 %
EOS (ABSOLUTE): 0.3 10*3/uL (ref 0.0–0.4)
Eos: 5 %
Hematocrit: 39.4 % (ref 34.0–46.6)
Hemoglobin: 12.4 g/dL (ref 11.1–15.9)
IMMATURE GRANS (ABS): 0 10*3/uL (ref 0.0–0.1)
Immature Granulocytes: 0 %
LYMPHS ABS: 3.2 10*3/uL — AB (ref 0.7–3.1)
LYMPHS: 51 %
MCH: 27.7 pg (ref 26.6–33.0)
MCHC: 31.5 g/dL (ref 31.5–35.7)
MCV: 88 fL (ref 79–97)
MONOCYTES: 7 %
Monocytes Absolute: 0.4 10*3/uL (ref 0.1–0.9)
NEUTROS ABS: 2.2 10*3/uL (ref 1.4–7.0)
Neutrophils: 36 %
PLATELETS: 300 10*3/uL (ref 150–450)
RBC: 4.48 x10E6/uL (ref 3.77–5.28)
RDW: 13.3 % (ref 12.3–15.4)
WBC: 6.1 10*3/uL (ref 3.4–10.8)

## 2018-01-14 LAB — COMPREHENSIVE METABOLIC PANEL
A/G RATIO: 2.2 (ref 1.2–2.2)
ALT: 16 IU/L (ref 0–32)
AST: 21 IU/L (ref 0–40)
Albumin: 4.7 g/dL (ref 3.5–5.5)
Alkaline Phosphatase: 80 IU/L (ref 39–117)
BUN/Creatinine Ratio: 15 (ref 9–23)
BUN: 18 mg/dL (ref 6–24)
Bilirubin Total: 0.3 mg/dL (ref 0.0–1.2)
CO2: 17 mmol/L — ABNORMAL LOW (ref 20–29)
Calcium: 9.6 mg/dL (ref 8.7–10.2)
Chloride: 113 mmol/L — ABNORMAL HIGH (ref 96–106)
Creatinine, Ser: 1.24 mg/dL — ABNORMAL HIGH (ref 0.57–1.00)
GFR, EST AFRICAN AMERICAN: 56 mL/min/{1.73_m2} — AB (ref >59)
GFR, EST NON AFRICAN AMERICAN: 49 mL/min/{1.73_m2} — AB (ref >59)
GLUCOSE: 106 mg/dL — AB (ref 65–99)
Globulin, Total: 2.1 g/dL (ref 1.5–4.5)
Potassium: 4.2 mmol/L (ref 3.5–5.2)
Sodium: 143 mmol/L (ref 134–144)
TOTAL PROTEIN: 6.8 g/dL (ref 6.0–8.5)

## 2018-01-14 LAB — LIPID PANEL
CHOLESTEROL TOTAL: 193 mg/dL (ref 100–199)
Chol/HDL Ratio: 4.2 ratio (ref 0.0–4.4)
HDL: 46 mg/dL (ref >39)
LDL CALC: 126 mg/dL — AB (ref 0–99)
TRIGLYCERIDES: 104 mg/dL (ref 0–149)
VLDL CHOLESTEROL CAL: 21 mg/dL (ref 5–40)

## 2018-01-14 LAB — HEMOGLOBIN A1C
Est. average glucose Bld gHb Est-mCnc: 111 mg/dL
Hgb A1c MFr Bld: 5.5 % (ref 4.8–5.6)

## 2018-01-14 NOTE — Patient Instructions (Addendum)
If you have lab work done today you will be contacted with your lab results within the next 2 weeks.  If you have not heard from [place] then please contact us. The fastest way to get your results is to register for My Chart.   IF you received an x-ray today, you will receive an invoice from [facility]. Please contact [facility] at [phone] with questions or concerns regarding your invoice.   IF you received labwork today, you will receive an invoice from [place]. Please contact [facility] at [phone] with questions or concerns regarding your invoice.   Our billing staff will not be able to assist you with questions regarding bills from these companies.  You will be contacted with the lab results as soon as they are available. The fastest way to get your results is to activate your My Chart account. Instructions are located on the last page of this paperwork. If you have not heard from [place] regarding the results in 2 weeks, please contact this office.     Health Maintenance, Female Adopting a healthy lifestyle and getting preventive care can go a long way to promote health and wellness. Talk with your health care provider about what schedule of regular examinations is right for you. This is a good chance for you to check in with your provider about disease prevention and staying healthy. In between checkups, there are plenty of things you can do on your own. Experts have done a lot of research about which lifestyle changes and preventive measures are most likely to keep you healthy. Ask your health care provider for more information. Weight and diet Eat a healthy diet  Be sure to include plenty of vegetables, fruits, low-fat dairy products, and lean protein.  Do not eat a lot of foods high in solid fats, added sugars, or salt.  Get regular exercise. This is one of the most important things you can do for your health. ? Most adults should exercise for at least 150  minutes each week. The exercise should increase your heart rate and make you sweat (moderate-intensity exercise). ? Most adults should also do strengthening exercises at least twice a week. This is in addition to the moderate-intensity exercise.  Maintain a healthy weight  Body mass index (BMI) is a measurement that can be used to identify possible weight problems. It estimates body fat based on height and weight. Your health care provider can help determine your BMI and help you achieve or maintain a healthy weight.  For females 66 years of age and older: ? A BMI below 18.5 is considered underweight. ? A BMI of 18.5 to 24.9 is normal. ? A BMI of 25 to 29.9 is considered overweight. ? A BMI of 30 and above is considered obese.  Watch levels of cholesterol and blood lipids  You should start having your blood tested for lipids and cholesterol at 55 years of age, then have this test every 5 years.  You may need to have your cholesterol levels checked more often if: ? Your lipid or cholesterol levels are high. ? You are older than 56 years of age. ? You are at high risk for heart disease.  Cancer screening Lung Cancer  Lung cancer screening is recommended for adults 3-65 years old who are at high risk for lung cancer because of a history of smoking.  A yearly low-dose CT scan of the lungs is recommended for people who: ? Currently smoke. ? Have quit  within the past 15 years. ? Have at least a 30-pack-year history of smoking. A pack year is smoking an average of one pack of cigarettes a day for 1 year.  Yearly screening should continue until it has been 15 years since you quit.  Yearly screening should stop if you develop a health problem that would prevent you from having lung cancer treatment.  Breast Cancer  Practice breast self-awareness. This means understanding how your breasts normally appear and feel.  It also means doing regular breast self-exams. Let your health care  provider know about any changes, no matter how small.  If you are in your 20s or 30s, you should have a clinical breast exam (CBE) by a health care provider every 1-3 years as part of a regular health exam.  If you are 48 or older, have a CBE every year. Also consider having a breast X-ray (mammogram) every year.  If you have a family history of breast cancer, talk to your health care provider about genetic screening.  If you are at high risk for breast cancer, talk to your health care provider about having an MRI and a mammogram every year.  Breast cancer gene (BRCA) assessment is recommended for women who have family members with BRCA-related cancers. BRCA-related cancers include: ? Breast. ? Ovarian. ? Tubal. ? Peritoneal cancers.  Results of the assessment will determine the need for genetic counseling and BRCA1 and BRCA2 testing.  Cervical Cancer Your health care provider may recommend that you be screened regularly for cancer of the pelvic organs (ovaries, uterus, and vagina). This screening involves a pelvic examination, including checking for microscopic changes to the surface of your cervix (Pap test). You may be encouraged to have this screening done every 3 years, beginning at age 8.  For women ages 8-65, health care providers may recommend pelvic exams and Pap testing every 3 years, or they may recommend the Pap and pelvic exam, combined with testing for human papilloma virus (HPV), every 5 years. Some types of HPV increase your risk of cervical cancer. Testing for HPV may also be done on women of any age with unclear Pap test results.  Other health care providers may not recommend any screening for nonpregnant women who are considered low risk for pelvic cancer and who do not have symptoms. Ask your health care provider if a screening pelvic exam is right for you.  If you have had past treatment for cervical cancer or a condition that could lead to cancer, you need Pap tests  and screening for cancer for at least 20 years after your treatment. If Pap tests have been discontinued, your risk factors (such as having a new sexual partner) need to be reassessed to determine if screening should resume. Some women have medical problems that increase the chance of getting cervical cancer. In these cases, your health care provider may recommend more frequent screening and Pap tests.  Colorectal Cancer  This type of cancer can be detected and often prevented.  Routine colorectal cancer screening usually begins at 56 years of age and continues through 56 years of age.  Your health care provider may recommend screening at an earlier age if you have risk factors for colon cancer.  Your health care provider may also recommend using home test kits to check for hidden blood in the stool.  A small camera at the end of a tube can be used to examine your colon directly (sigmoidoscopy or colonoscopy). This is done to check  for the earliest forms of colorectal cancer.  Routine screening usually begins at age 57.  Direct examination of the colon should be repeated every 5-10 years through 56 years of age. However, you may need to be screened more often if early forms of precancerous polyps or small growths are found.  Skin Cancer  Check your skin from head to toe regularly.  Tell your health care provider about any new moles or changes in moles, especially if there is a change in a mole's shape or color.  Also tell your health care provider if you have a mole that is larger than the size of a pencil eraser.  Always use sunscreen. Apply sunscreen liberally and repeatedly throughout the day.  Protect yourself by wearing long sleeves, pants, a wide-brimmed hat, and sunglasses whenever you are outside.  Heart disease, diabetes, and high blood pressure  High blood pressure causes heart disease and increases the risk of stroke. High blood pressure is more likely to develop  in: ? People who have blood pressure in the high end of the normal range (130-139/85-89 mm Hg). ? People who are overweight or obese. ? People who are African American.  If you are 48-54 years of age, have your blood pressure checked every 3-5 years. If you are 82 years of age or older, have your blood pressure checked every year. You should have your blood pressure measured twice-once when you are at a hospital or clinic, and once when you are not at a hospital or clinic. Record the average of the two measurements. To check your blood pressure when you are not at a hospital or clinic, you can use: ? An automated blood pressure machine at a pharmacy. ? A home blood pressure monitor.  If you are between 86 years and 49 years old, ask your health care provider if you should take aspirin to prevent strokes.  Have regular diabetes screenings. This involves taking a blood sample to check your fasting blood sugar level. ? If you are at a normal weight and have a low risk for diabetes, have this test once every three years after 56 years of age. ? If you are overweight and have a high risk for diabetes, consider being tested at a younger age or more often. Preventing infection Hepatitis B  If you have a higher risk for hepatitis B, you should be screened for this virus. You are considered at high risk for hepatitis B if: ? You were born in a country where hepatitis B is common. Ask your health care provider which countries are considered high risk. ? Your parents were born in a high-risk country, and you have not been immunized against hepatitis B (hepatitis B vaccine). ? You have HIV or AIDS. ? You use needles to inject street drugs. ? You live with someone who has hepatitis B. ? You have had sex with someone who has hepatitis B. ? You get hemodialysis treatment. ? You take certain medicines for conditions, including cancer, organ transplantation, and autoimmune conditions.  Hepatitis C  Blood  testing is recommended for: ? Everyone born from [birth year] through 1964. ? Anyone with known risk factors for hepatitis C.  Sexually transmitted infections (STIs)  You should be screened for sexually transmitted infections (STIs) including gonorrhea and chlamydia if: ? You are sexually active and are younger than 56 years of age. ? You are older than 56 years of age and your health care provider tells you that you are at risk for  this type of infection. ? Your sexual activity has changed since you were last screened and you are at an increased risk for chlamydia or gonorrhea. Ask your health care provider if you are at risk.  If you do not have HIV, but are at risk, it may be recommended that you take a prescription medicine daily to prevent HIV infection. This is called pre-exposure prophylaxis (PrEP). You are considered at risk if: ? You are sexually active and do not regularly use condoms or know the HIV status of your partner(s). ? You take drugs by injection. ? You are sexually active with a partner who has HIV.  Talk with your health care provider about whether you are at high risk of being infected with HIV. If you choose to begin PrEP, you should first be tested for HIV. You should then be tested every 3 months for as long as you are taking PrEP. Pregnancy  If you are premenopausal and you may become pregnant, ask your health care provider about preconception counseling.  If you may become pregnant, take 400 to 800 micrograms (mcg) of folic acid every day.  If you want to prevent pregnancy, talk to your health care provider about birth control (contraception). Osteoporosis and menopause  Osteoporosis is a disease in which the bones lose minerals and strength with aging. This can result in serious bone fractures. Your risk for osteoporosis can be identified using a bone density scan.  If you are 69 years of age or older, or if you are at risk for osteoporosis and fractures, ask your  health care provider if you should be screened.  Ask your health care provider whether you should take a calcium or vitamin D supplement to lower your risk for osteoporosis.  Menopause may have certain physical symptoms and risks.  Hormone replacement therapy may reduce some of these symptoms and risks. Talk to your health care provider about whether hormone replacement therapy is right for you. Follow these instructions at home:  Schedule regular health, dental, and eye exams.  Stay current with your immunizations.  Do not use any tobacco products including cigarettes, chewing tobacco, or electronic cigarettes.  If you are pregnant, do not drink alcohol.  If you are breastfeeding, limit how much and how often you drink alcohol.  Limit alcohol intake to no more than 1 drink per day for nonpregnant women. One drink equals 12 ounces of beer, 5 ounces of wine, or 1 ounces of hard liquor.  Do not use street drugs.  Do not share needles.  Ask your health care provider for help if you need support or information about quitting drugs.  Tell your health care provider if you often feel depressed.  Tell your health care provider if you have ever been abused or do not feel safe at home. This information is not intended to replace advice given to you by your health care provider. Make sure you discuss any questions you have with your health care provider. Document Released: 08/08/2010 Document Revised: 07/01/2015 Document Reviewed: 10/27/2014 [employer]vier Interactive Patient Education  [employer].

## 2018-01-14 NOTE — Progress Notes (Signed)
BP Readings from Last 3 Encounters:  12/31/17 138/68  11/29/17 124/84  10/09/17 128/62   Wt Readings from Last 3 Encounters:  12/31/17 216 lb 3.2 oz (98.1 kg)  11/29/17 217 lb 6.4 oz (98.6 kg)  10/09/17 204 lb 3.2 oz (92.6 kg)   Samantha Pearson 57 y.o.   Chief Complaint  Patient presents with  . Annual Exam    physical form to be completed     HISTORY OF PRESENT ILLNESS: This is a 57 y.o. female here today for her annual exam. No complaints or medical concerns today. Past medical history includes hypertension, gout, rheumatoid arthritis, on medications including Aldactone and low-dose prednisone daily, 5 mg.  Has appointment with rheumatologist in 2 weeks. Up-to-date with GYN screening.  Abnormal Pap smear done last July and normal mammogram done last October. Non-smoker.  [place] abuser.   HPI   Prior to Admission medications   Medication Sig Start Date End Date Taking? Authorizing Provider  MITIGARE 0.6 MG CAPS Take 1 capsule by mouth 2 (two) times daily. 10/09/17  Yes Weber, Sarah L, PA-C  olmesartan (BENICAR) 40 MG tablet Take 1 tablet (40 mg total) by mouth daily. 09/14/17  Yes Weber, Sarah L, PA-C  predniSONE (DELTASONE) 5 MG tablet TAKE 1 TABLET BY MOUTH EVERY DAY WITH FOOD OR MILK 12/06/17  Yes [provider]  spironolactone (ALDACTONE) 50 MG tablet TAKE 1 TABLET EVERY DAY (DO NOT TAKE POTASSIUM SUPPLEMENT WITH THIS MEDICATION) 09/14/17  Yes Weber, Damaris Hippo, PA-C  Turmeric POWD 1,950 mg by Does not apply route 3 (three) times daily.   Yes [provider]    Allergies  Allergen Reactions  . Doxycycline Hives, Itching and Swelling  . Norvasc [Amlodipine Besylate] Swelling    Feet swelling  . Septra [Bactrim] Hives, Itching and Swelling  . Latex Swelling and Rash    Patient Active Problem List   Diagnosis Date Noted  . 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 Class 3 severe obesity due to excess calories with serious  comorbidity and body mass index (BMI) of 40.0 to 44.9 in adult ([place]) 08/18/2013  . Stress 05/22/2011  . Essential hypertension 08/07/2008    Past Medical History:  Diagnosis Date  . Allergy   . Anxiety   . Arthritis   . Chest pain 08/03/08   Urgent care visit  . Depression   . Dyspnea   . Edema   . Heart murmur   . Hypertension   . Obesity     Past Surgical History:  Procedure Laterality Date  . CESAREAN SECTION    . DILATION AND CURETTAGE OF UTERUS     laparoscopic    Social History   Socioeconomic History  . Marital status: Single    Spouse name: Not on file  . Number of children: 1  . Years of education: Not on file  . Highest education level: Not on file  Occupational History  . Occupation: 3 jobs    Comment: Paramedic  Social Needs  . Financial resource strain: Not on file  . Food insecurity:    Worry: Not on file    Inability: Not on file  . Transportation needs:    Medical: Not on file    Non-medical: Not on file  Tobacco Use  . Smoking status: Never Smoker  . Smokeless tobacco: Never Used  . Tobacco comment: Nonsmoker  Substance and Sexual Activity  . Alcohol use: No  Alcohol/week: 0.0 standard drinks  . Drug use: No  . Sexual activity: Yes    Birth control/protection: None  Lifestyle  . Physical activity:    Days per week: Not on file    Minutes per session: Not on file  . Stress: Not on file  Relationships  . Social connections:    Talks on phone: Not on file    Gets together: Not on file    Attends religious service: Not on file    Active member of club or organization: Not on file    Attends meetings of clubs or organizations: Not on file    Relationship status: Not on file  . Intimate partner violence:    Fear of current or ex partner: Not on file    Emotionally abused: Not on file    Physically abused: Not on file    Forced sexual activity: Not on file  Other Topics Concern  . 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 Prostate cancer Father   . Congestive Heart Failure Father   . Colon cancer Maternal Uncle   . Colon polyps Mother   . Breast cancer Mother 47  . Hypertension Sister   . Hypertension Brother   . Hypertension Sister   . Hypertension Sister   . Hypertension Sister   . Esophageal cancer Neg Hx   . Stomach cancer Neg Hx   . Rectal cancer Neg Hx      Review of Systems  Constitutional: Negative.  Negative for chills, fever and weight loss.  HENT: Negative.  Negative for hearing loss and sore throat.   Eyes: Negative.  Negative for blurred vision and double vision.  Respiratory: Negative.  Negative for cough and shortness of breath.   Cardiovascular: Negative.  Negative for chest pain and palpitations.  Gastrointestinal: Negative.  Negative for abdominal pain, blood in stool, diarrhea, melena, nausea and vomiting.  Genitourinary: Negative.  Negative for dysuria, frequency and hematuria.  Musculoskeletal: Negative.  Negative for back pain, myalgias and neck pain.  Skin: Negative.  Negative for rash.  Neurological: Negative.  Negative for dizziness and headaches.  Endo/Heme/Allergies: Negative.   All other systems reviewed and are negative.   Vitals:   01/14/18 0807  BP: 126/76  Pulse: 70  Resp: 16  Temp: 98.4 F (36.9 C)  SpO2: 98%    Physical Exam  Constitutional: She is oriented to person, place, and time. [name].  HENT:  Head: Normocephalic and atraumatic.  Right Ear: External ear normal.  Left Ear: External  ear normal.  Nose: Nose normal.  Mouth/Throat: Oropharynx is clear and moist.  Eyes: Pupils are equal, round, and reactive to light. Conjunctivae and EOM are normal.  Neck: Normal range of motion. Neck supple. No JVD present. No thyromegaly present.  Cardiovascular: Normal rate, regular rhythm, normal heart sounds and intact distal pulses.  Pulmonary/Chest: Effort normal and breath  sounds normal. No respiratory distress.  Abdominal: Soft. Bowel sounds are normal. She exhibits no distension. There is no tenderness.  Musculoskeletal:  Some arthritic changes to hands and feet.  Compatible with RA.  Lymphadenopathy:    She has no cervical adenopathy.  Neurological: She is alert and oriented to person, place, and time. No sensory deficit. She exhibits normal muscle tone. Coordination normal.  Skin: Skin is warm and dry. Capillary refill takes less than 2 seconds.  Psychiatric: She has a normal mood and affect. [name].  Vitals reviewed.    ASSESSMENT & PLAN: Matilynn was seen today for annual exam.  Diagnoses and all orders for this visit:  Routine general medical examination at a health care facility  Essential hypertension -     Comprehensive metabolic panel  Screening for deficiency anemia -     CBC with Differential  Screening for lipoid disorders -     Hemoglobin A1c -     Lipid panel  Screening for endocrine, metabolic and immunity disorder -     Comprehensive metabolic panel -     Hemoglobin A1c -     Lipid panel  History of gout  History of rheumatoid arthritis   Patient Instructions       If you have lab work done today you will be contacted with your lab results within the next 2 weeks.  If you have not heard from [place] then please contact us. The fastest way to get your results is to register for My Chart.   IF you received an x-ray today, you will receive an invoice from [facility]. Please contact [facility] at [phone]  with questions or concerns regarding your invoice.   IF you received labwork today, you will receive an invoice from [place]. Please contact [facility] at [phone] with questions or concerns regarding your invoice.   Our billing staff will not be able to assist you with questions regarding bills from these companies.  You will be contacted with the lab results as soon as they are available. The fastest way to get your results is to activate your My Chart account. Instructions are located on the last page of this paperwork. If you have not heard from [place] regarding the results in 2 weeks, please contact this office.     Health Maintenance, Female Adopting a healthy lifestyle and getting preventive care can go a long way to promote health and wellness. Talk with your health care provider about what schedule of regular examinations is right for you. This is a good chance for you to check in with your provider about disease prevention and staying healthy. In between checkups, there are plenty of things you can do on your own. Experts have done a lot of research about which lifestyle changes and preventive measures are most likely to keep you healthy. Ask your health care provider for more information. Weight and diet Eat a healthy diet  Be sure to include plenty of vegetables, fruits, low-fat dairy products, and lean protein.  Do not eat a lot of foods high in solid fats, added sugars, or salt.  Get regular exercise. This is one of the most important things you can do for your health. ? Most adults should exercise for at least 150 minutes each week. The exercise should increase your heart rate and make you sweat (moderate-intensity exercise). ? Most adults should also do strengthening exercises at least twice a week. This is in addition to the moderate-intensity exercise.  Maintain a healthy weight  Body mass index (BMI) is a measurement that can be used to identify possible weight problems.  It estimates body fat based on height and weight. Your health care provider can help determine your BMI and help you achieve or maintain a healthy weight.  For females 52 years of age and older: ? A BMI below 18.5 is considered underweight. ? A BMI of 18.5 to 24.9 is normal. ? A BMI of 25 to 29.9 is considered overweight. ? A BMI of 30 and above is considered obese.  Watch levels of cholesterol and blood lipids  You should start having your blood tested for lipids and cholesterol at 56 years of age, then have this test every 5 years.  You may need to have your cholesterol levels checked more often if: ? Your lipid or cholesterol levels are high. ? You are older than 56 years of age. ? You are at high risk for heart disease.  Cancer screening Lung Cancer  Lung cancer screening is recommended for adults 70-79 years old who are at high risk for lung cancer because of a history of smoking.  A yearly low-dose CT scan of the lungs is recommended for people who: ? Currently smoke. ? Have quit within the past 15 years. ? Have at least a 30-pack-year history of smoking. A pack year is smoking an average of one pack of cigarettes a day for 1 year.  Yearly screening should continue until it has been 15 years since you quit.  Yearly screening should stop if you develop a health problem that would prevent you from having lung cancer treatment.  Breast Cancer  Practice breast self-awareness. This means understanding how your breasts normally appear and feel.  It also means doing regular breast self-exams. Let your health care provider know about any changes, no matter how small.  If you are in your 20s or 30s, you should have a clinical breast exam (CBE) by a health care provider every 1-3 years as part of a regular health exam.  If you are 50 or older, have a CBE every year. Also consider having a breast X-ray (mammogram) every year.  If you have a family history of breast cancer, talk to  your health care provider about genetic screening.  If you are at high risk for breast cancer, talk to your health care provider about having an MRI and a mammogram every year.  Breast cancer gene (BRCA) assessment is recommended for women who have family members with BRCA-related cancers. BRCA-related cancers include: ? Breast. ? Ovarian. ? Tubal. ? Peritoneal cancers.  Results of the assessment will determine the need for genetic counseling and BRCA1 and BRCA2 testing.  Cervical Cancer Your health care provider may recommend that you be screened regularly for cancer of the pelvic organs (ovaries, uterus, and vagina). This screening involves a pelvic examination, including checking for microscopic changes to the surface of your cervix (Pap test). You may be encouraged to have this screening done every 3 years, beginning at age 62.  For women ages 57-65, health care providers may recommend pelvic exams and Pap testing every 3 years, or they may recommend the Pap and pelvic exam, combined with testing for human papilloma virus (HPV), every 5 years. Some types of HPV increase your risk of cervical cancer. Testing for HPV may also be done on women of any age with unclear Pap test results.  Other health care providers may not recommend any screening for nonpregnant women  who are considered low risk for pelvic cancer and who do not have symptoms. Ask your health care provider if a screening pelvic exam is right for you.  If you have had past treatment for cervical cancer or a condition that could lead to cancer, you need Pap tests and screening for cancer for at least 20 years after your treatment. If Pap tests have been discontinued, your risk factors (such as having a new sexual partner) need to be reassessed to determine if screening should resume. Some women have medical problems that increase the chance of getting cervical cancer. In these cases, your health care provider may recommend more  frequent screening and Pap tests.  Colorectal Cancer  This type of cancer can be detected and often prevented.  Routine colorectal cancer screening usually begins at 56 years of age and continues through 56 years of age.  Your health care provider may recommend screening at an earlier age if you have risk factors for colon cancer.  Your health care provider may also recommend using home test kits to check for hidden blood in the stool.  A small camera at the end of a tube can be used to examine your colon directly (sigmoidoscopy or colonoscopy). This is done to check for the earliest forms of colorectal cancer.  Routine screening usually begins at age 61.  Direct examination of the colon should be repeated every 5-10 years through 56 years of age. However, you may need to be screened more often if early forms of precancerous polyps or small growths are found.  Skin Cancer  Check your skin from head to toe regularly.  Tell your health care provider about any new moles or changes in moles, especially if there is a change in a mole's shape or color.  Also tell your health care provider if you have a mole that is larger than the size of a pencil eraser.  Always use sunscreen. Apply sunscreen liberally and repeatedly throughout the day.  Protect yourself by wearing long sleeves, pants, a wide-brimmed hat, and sunglasses whenever you are outside.  Heart disease, diabetes, and high blood pressure  High blood pressure causes heart disease and increases the risk of stroke. High blood pressure is more likely to develop in: ? People who have blood pressure in the high end of the normal range (130-139/85-89 mm Hg). ? People who are overweight or obese. ? People who are African American.  If you are 83-19 years of age, have your blood pressure checked every 3-5 years. If you are 7 years of age or older, have your blood pressure checked every year. You should have your blood pressure measured  twice-once when you are at a hospital or clinic, and once when you are not at a hospital or clinic. Record the average of the two measurements. To check your blood pressure when you are not at a hospital or clinic, you can use: ? An automated blood pressure machine at a pharmacy. ? A home blood pressure monitor.  If you are between 2 years and 38 years old, ask your health care provider if you should take aspirin to prevent strokes.  Have regular diabetes screenings. This involves taking a blood sample to check your fasting blood sugar level. ? If you are at a normal weight and have a low risk for diabetes, have this test once every three years after 56 years of age. ? If you are overweight and have a high risk for diabetes, consider being tested  at a younger age or more often. Preventing infection Hepatitis B  If you have a higher risk for hepatitis B, you should be screened for this virus. You are considered at high risk for hepatitis B if: ? You were born in a country where hepatitis B is common. Ask your health care provider which countries are considered high risk. ? Your parents were born in a high-risk country, and you have not been immunized against hepatitis B (hepatitis B vaccine). ? You have HIV or AIDS. ? You use needles to inject street drugs. ? You live with someone who has hepatitis B. ? You have had sex with someone who has hepatitis B. ? You get hemodialysis treatment. ? You take certain medicines for conditions, including cancer, organ transplantation, and autoimmune conditions.  Hepatitis C  Blood testing is recommended for: ? Everyone born from [birth year] through 1964. ? Anyone with known risk factors for hepatitis C.  Sexually transmitted infections (STIs)  You should be screened for sexually transmitted infections (STIs) including gonorrhea and chlamydia if: ? You are sexually active and are younger than 56 years of age. ? You are older than 55 years of age and your  health care provider tells you that you are at risk for this type of infection. ? Your sexual activity has changed since you were last screened and you are at an increased risk for chlamydia or gonorrhea. Ask your health care provider if you are at risk.  If you do not have HIV, but are at risk, it may be recommended that you take a prescription medicine daily to prevent HIV infection. This is called pre-exposure prophylaxis (PrEP). You are considered at risk if: ? You are sexually active and do not regularly use condoms or know the HIV status of your partner(s). ? You take drugs by injection. ? You are sexually active with a partner who has HIV.  Talk with your health care provider about whether you are at high risk of being infected with HIV. If you choose to begin PrEP, you should first be tested for HIV. You should then be tested every 3 months for as long as you are taking PrEP. Pregnancy  If you are premenopausal and you may become pregnant, ask your health care provider about preconception counseling.  If you may become pregnant, take 400 to 800 micrograms (mcg) of folic acid every day.  If you want to prevent pregnancy, talk to your health care provider about birth control (contraception). Osteoporosis and menopause  Osteoporosis is a disease in which the bones lose minerals and strength with aging. This can result in serious bone fractures. Your risk for osteoporosis can be identified using a bone density scan.  If you are 12 years of age or older, or if you are at risk for osteoporosis and fractures, ask your health care provider if you should be screened.  Ask your health care provider whether you should take a calcium or vitamin D supplement to lower your risk for osteoporosis.  Menopause may have certain physical symptoms and risks.  Hormone replacement therapy may reduce some of these symptoms and risks. Talk to your health care provider about whether hormone replacement  therapy is right for you. Follow these instructions at home:  Schedule regular health, dental, and eye exams.  Stay current with your immunizations.  Do not use any tobacco products including cigarettes, chewing tobacco, or electronic cigarettes.  If you are pregnant, do not drink alcohol.  If you are breastfeeding,  limit how much and how often you drink alcohol.  Limit alcohol intake to no more than 1 drink per day for nonpregnant women. One drink equals 12 ounces of beer, 5 ounces of wine, or 1 ounces of hard liquor.  Do not use street drugs.  Do not share needles.  Ask your health care provider for help if you need support or information about quitting drugs.  Tell your health care provider if you often feel depressed.  Tell your health care provider if you have ever been abused or do not feel safe at home. This information is not intended to replace advice given to you by your health care provider. Make sure you discuss any questions you have with your health care provider. Document Released: 08/08/2010 Document Revised: 07/01/2015 Document Reviewed: 10/27/2014 Elsevier Interactive Patient Education  2018 Elsevier Inc.      Agustina Caroli, MD [facility]

## 2018-01-15 ENCOUNTER — Encounter: Payer: Self-pay | Admitting: *Deleted

## 2018-01-15 ENCOUNTER — Other Ambulatory Visit: Payer: Self-pay | Admitting: Emergency Medicine

## 2018-01-15 DIAGNOSIS — N289 Disorder of kidney and ureter, unspecified: Secondary | ICD-10-CM

## 2018-01-31 DIAGNOSIS — I10 Essential (primary) hypertension: Secondary | ICD-10-CM | POA: Diagnosis not present

## 2018-01-31 DIAGNOSIS — M109 Gout, unspecified: Secondary | ICD-10-CM | POA: Diagnosis not present

## 2018-01-31 DIAGNOSIS — M1990 Unspecified osteoarthritis, unspecified site: Secondary | ICD-10-CM | POA: Diagnosis not present

## 2018-01-31 DIAGNOSIS — Z79899 Other long term (current) drug therapy: Secondary | ICD-10-CM | POA: Diagnosis not present

## 2018-03-31 ENCOUNTER — Other Ambulatory Visit: Payer: Self-pay | Admitting: Emergency Medicine

## 2018-03-31 DIAGNOSIS — R601 Generalized edema: Secondary | ICD-10-CM

## 2018-03-31 DIAGNOSIS — M1A09X0 Idiopathic chronic gout, multiple sites, without tophus (tophi): Secondary | ICD-10-CM

## 2018-04-01 NOTE — Telephone Encounter (Signed)
Requested medication (s) are due for refill today: yes  Requested medication (s) are on the active medication list: yes  Last refill:  mitigare 0.6 mg LR 0903/19 and spironolactone 50 mg, LR 09/14/17  Future visit scheduled: no  Notes to clinic:  Gout agents failed, uric acid in normal range failed. Aldosterone antagonist failed.   Requested Prescriptions  Pending Prescriptions Disp Refills   MITIGARE 0.6 MG CAPS [Pharmacy Med Name: MITIGARE 0.6 MG CAPSULE] 30 capsule 2    Sig: TAKE 1 CAPSULE BY MOUTH TWICE A DAY     Endocrinology:  Gout Agents Failed - 03/31/2018 12:24 PM      Failed - Uric Acid in normal range and within 360 days    Uric Acid  Date Value Ref Range Status  11/29/2017 7.3 (H) 2.5 - 7.1 mg/dL Final    Comment:               Therapeutic target for gout patients: <6.0         Failed - Cr in normal range and within 360 days    Creat  Date Value Ref Range Status  11/27/2015 0.86 0.50 - 1.05 mg/dL Final    Comment:      For patients > or = 57 years of age: The upper reference limit for Creatinine is approximately 13% higher for people identified as African-American.      Creatinine, Ser  Date Value Ref Range Status  01/14/2018 1.24 (H) 0.57 - 1.00 mg/dL Final         Passed - Valid encounter within last 12 months    Recent Outpatient Visits          2 months ago Routine general medical examination at a health care facility   [facility] [place], [place], MD   4 months ago Chronic gout with tophus, unspecified cause, unspecified site   [facility] [place], Asencion Partridge, MD   5 months ago Idiopathic chronic gout of multiple sites with tophus   [facility], Dema Severin, PA-C   6 months ago Idiopathic chronic gout of multiple sites without tophus   [facility], Dema Severin, PA-C   7 months ago Essential hypertension   [facility], Dema Severin, PA-C            spironolactone (ALDACTONE) 50  MG tablet [Pharmacy Med Name: SPIRONOLACTONE 50 MG TABLET] 90 tablet 1    Sig: TAKE 1 TABLET EVERY DAY (DO NOT TAKE POTASSIUM SUPPLEMENT WITH THIS MEDICATION)     Cardiovascular: Diuretics - Aldosterone Antagonist Failed - 03/31/2018 12:24 PM      Failed - Cr in normal range and within 360 days    Creat  Date Value Ref Range Status  11/27/2015 0.86 0.50 - 1.05 mg/dL Final    Comment:      For patients > or = 57 years of age: The upper reference limit for Creatinine is approximately 13% higher for people identified as African-American.      Creatinine, Ser  Date Value Ref Range Status  01/14/2018 1.24 (H) 0.57 - 1.00 mg/dL Final         Passed - K in normal range and within 360 days    Potassium  Date Value Ref Range Status  01/14/2018 4.2 3.5 - 5.2 mmol/L Final         Passed - Na in normal range and within 360 days  Sodium  Date Value Ref Range Status  01/14/2018 143 134 - 144 mmol/L Final         Passed - Last BP in normal range    BP Readings from Last 1 Encounters:  01/14/18 126/76         Passed - Valid encounter within last 6 months    Recent Outpatient Visits          2 months ago Routine general medical examination at a health care facility   [facility] [place], [place], MD   4 months ago Chronic gout with tophus, unspecified cause, unspecified site   [facility], Asencion Partridge, MD   5 months ago Idiopathic chronic gout of multiple sites with tophus   [facility], Dema Severin, PA-C   6 months ago Idiopathic chronic gout of multiple sites without tophus   [facility], Dema Severin, PA-C   7 months ago Essential hypertension   [facility], Dema Severin, PA-C

## 2018-04-04 ENCOUNTER — Other Ambulatory Visit: Payer: Self-pay | Admitting: Emergency Medicine

## 2018-04-04 DIAGNOSIS — I10 Essential (primary) hypertension: Secondary | ICD-10-CM

## 2018-04-04 MED ORDER — OLMESARTAN MEDOXOMIL 40 MG PO TABS: 40.0000 mg | ORAL_TABLET | Freq: Every day | ORAL | 1 refills | Status: DC

## 2018-04-04 NOTE — Telephone Encounter (Signed)
Copied from CRM [id number]. Topic: Quick Communication - Rx Refill/Question >> Apr 04, 2018  8:23 AM Tamela Oddi wrote: Medication: olmesartan (BENICAR) 40 MG tablet  Patient called to request a refill for the above medication  Preferred Pharmacy (with phone number or street name): [id number] - [place], [place] - [address] AT [address] [address] [phone] (Phone) [phone] (Fax)

## 2018-04-04 NOTE — Telephone Encounter (Signed)
Requested Prescriptions  Pending Prescriptions Disp Refills  . olmesartan (BENICAR) 40 MG tablet 90 tablet 1    Sig: Take 1 tablet (40 mg total) by mouth daily.     Cardiovascular:  Angiotensin Receptor Blockers Failed - 04/04/2018  8:29 AM      Failed - Cr in normal range and within 180 days    Creat  Date Value Ref Range Status  11/27/2015 0.86 0.50 - 1.05 mg/dL Final    Comment:      For patients > or = 57 years of age: The upper reference limit for Creatinine is approximately 13% higher for people identified as African-American.      Creatinine, Ser  Date Value Ref Range Status  01/14/2018 1.24 (H) 0.57 - 1.00 mg/dL Final         Passed - K in normal range and within 180 days    Potassium  Date Value Ref Range Status  01/14/2018 4.2 3.5 - 5.2 mmol/L Final         Passed - Patient is not pregnant      Passed - Last BP in normal range    BP Readings from Last 1 Encounters:  01/14/18 126/76         Passed - Valid encounter within last 6 months    Recent Outpatient Visits          2 months ago Routine general medical examination at a health care facility   [facility] [place], [place], [place]   4 months ago Chronic gout with tophus, unspecified cause, unspecified site   [facility] [place], Asencion Partridge, MD   5 months ago Idiopathic chronic gout of multiple sites with tophus   [facility], Dema Severin, PA-C   6 months ago Idiopathic chronic gout of multiple sites without tophus   [facility], Dema Severin, PA-C   7 months ago Essential hypertension   [facility], Dema Severin, PA-C

## 2018-04-23 ENCOUNTER — Other Ambulatory Visit: Payer: Self-pay | Admitting: Emergency Medicine

## 2018-04-23 DIAGNOSIS — R601 Generalized edema: Secondary | ICD-10-CM

## 2018-04-23 NOTE — Telephone Encounter (Signed)
Requested medication (s) are due for refill todayyes  Requested medication (s) are on the active medication list: yes  Last refill:  09/14/17  Future visit scheduled: No  Notes to clinic:  Need DX code    Requested Prescriptions  Pending Prescriptions Disp Refills   spironolactone (ALDACTONE) 50 MG tablet [Pharmacy Med Name: SPIRONOLACTONE 50 MG TABLET] 90 tablet 1    Sig: TAKE 1 TABLET EVERY DAY (DO NOT TAKE POTASSIUM SUPPLEMENT WITH THIS MEDICATION)     Cardiovascular: Diuretics - Aldosterone Antagonist Failed - 04/23/2018  2:36 PM      Failed - Cr in normal range and within 360 days    Creat  Date Value Ref Range Status  11/27/2015 0.86 0.50 - 1.05 mg/dL Final    Comment:      For patients > or = 57 years of age: The upper reference limit for Creatinine is approximately 13% higher for people identified as African-American.      Creatinine, Ser  Date Value Ref Range Status  01/14/2018 1.24 (H) 0.57 - 1.00 mg/dL Final         Passed - K in normal range and within 360 days    Potassium  Date Value Ref Range Status  01/14/2018 4.2 3.5 - 5.2 mmol/L Final         Passed - Na in normal range and within 360 days    Sodium  Date Value Ref Range Status  01/14/2018 143 134 - 144 mmol/L Final         Passed - Last BP in normal range    BP Readings from Last 1 Encounters:  01/14/18 126/76         Passed - Valid encounter within last 6 months    Recent Outpatient Visits          3 months ago Routine general medical examination at a health care facility   [facility] [place], [place], MD   4 months ago Chronic gout with tophus, unspecified cause, unspecified site   [facility], Asencion Partridge, MD   6 months ago Idiopathic chronic gout of multiple sites with tophus   [facility], Dema Severin, PA-C   7 months ago Idiopathic chronic gout of multiple sites without tophus   [facility], Dema Severin, PA-C   8 months ago  Essential hypertension   [facility], Dema Severin, PA-C

## 2018-05-21 ENCOUNTER — Other Ambulatory Visit: Payer: Self-pay | Admitting: Emergency Medicine

## 2018-05-21 DIAGNOSIS — R601 Generalized edema: Secondary | ICD-10-CM

## 2018-05-21 NOTE — Telephone Encounter (Signed)
Requested Prescriptions  Pending Prescriptions Disp Refills  . spironolactone (ALDACTONE) 50 MG tablet [Pharmacy Med Name: SPIRONOLACTONE 50 MG TABLET] 90 tablet 0    Sig: TAKE 1 TABLET EVERY DAY (DO NOT TAKE POTASSIUM SUPPLEMENT WITH THIS MEDICATION)     Cardiovascular: Diuretics - Aldosterone Antagonist Failed - 05/21/2018  9:37 AM      Failed - Cr in normal range and within 360 days    Creat  Date Value Ref Range Status  11/27/2015 0.86 0.50 - 1.05 mg/dL Final    Comment:      For patients > or = 57 years of age: The upper reference limit for Creatinine is approximately 13% higher for people identified as African-American.      Creatinine, Ser  Date Value Ref Range Status  01/14/2018 1.24 (H) 0.57 - 1.00 mg/dL Final         Passed - K in normal range and within 360 days    Potassium  Date Value Ref Range Status  01/14/2018 4.2 3.5 - 5.2 mmol/L Final         Passed - Na in normal range and within 360 days    Sodium  Date Value Ref Range Status  01/14/2018 143 134 - 144 mmol/L Final         Passed - Last BP in normal range    BP Readings from Last 1 Encounters:  01/14/18 126/76         Passed - Valid encounter within last 6 months    Recent Outpatient Visits          4 months ago Routine general medical examination at a health care facility   [facility] [place], [place], MD   5 months ago Chronic gout with tophus, unspecified cause, unspecified site   [facility], Asencion Partridge, MD   7 months ago Idiopathic chronic gout of multiple sites with tophus   [facility], Dema Severin, PA-C   8 months ago Idiopathic chronic gout of multiple sites without tophus   [facility], Dema Severin, PA-C   9 months ago Essential hypertension   [facility], Dema Severin, PA-C

## 2018-06-03 DIAGNOSIS — Z79899 Other long term (current) drug therapy: Secondary | ICD-10-CM | POA: Diagnosis not present

## 2018-06-03 DIAGNOSIS — M1990 Unspecified osteoarthritis, unspecified site: Secondary | ICD-10-CM | POA: Diagnosis not present

## 2018-06-03 DIAGNOSIS — I10 Essential (primary) hypertension: Secondary | ICD-10-CM | POA: Diagnosis not present

## 2018-06-03 DIAGNOSIS — M109 Gout, unspecified: Secondary | ICD-10-CM | POA: Diagnosis not present

## 2018-06-04 DIAGNOSIS — M109 Gout, unspecified: Secondary | ICD-10-CM | POA: Diagnosis not present

## 2018-06-04 DIAGNOSIS — Z79899 Other long term (current) drug therapy: Secondary | ICD-10-CM | POA: Diagnosis not present

## 2018-07-04 DIAGNOSIS — M109 Gout, unspecified: Secondary | ICD-10-CM | POA: Diagnosis not present

## 2018-07-04 DIAGNOSIS — Z79899 Other long term (current) drug therapy: Secondary | ICD-10-CM | POA: Diagnosis not present

## 2018-07-04 DIAGNOSIS — I10 Essential (primary) hypertension: Secondary | ICD-10-CM | POA: Diagnosis not present

## 2018-07-04 DIAGNOSIS — M1990 Unspecified osteoarthritis, unspecified site: Secondary | ICD-10-CM | POA: Diagnosis not present

## 2018-07-09 DIAGNOSIS — I129 Hypertensive chronic kidney disease with stage 1 through stage 4 chronic kidney disease, or unspecified chronic kidney disease: Secondary | ICD-10-CM | POA: Diagnosis not present

## 2018-07-09 DIAGNOSIS — N183 Chronic kidney disease, stage 3 (moderate): Secondary | ICD-10-CM | POA: Diagnosis not present

## 2018-07-09 DIAGNOSIS — R82998 Other abnormal findings in urine: Secondary | ICD-10-CM | POA: Diagnosis not present

## 2018-07-09 DIAGNOSIS — M1A09X1 Idiopathic chronic gout, multiple sites, with tophus (tophi): Secondary | ICD-10-CM | POA: Diagnosis not present

## 2018-07-16 NOTE — Telephone Encounter (Signed)
LVM to schedule appt

## 2018-08-06 ENCOUNTER — Encounter: Payer: Self-pay | Admitting: Emergency Medicine

## 2018-08-06 ENCOUNTER — Ambulatory Visit (INDEPENDENT_AMBULATORY_CARE_PROVIDER_SITE_OTHER): Payer: BC Managed Care – PPO | Admitting: Emergency Medicine

## 2018-08-06 ENCOUNTER — Other Ambulatory Visit: Payer: Self-pay

## 2018-08-06 VITALS — BP 152/82 | HR 74 | Temp 98.8°F | Resp 16 | Ht 59.0 in | Wt 226.0 lb

## 2018-08-06 DIAGNOSIS — I10 Essential (primary) hypertension: Secondary | ICD-10-CM | POA: Diagnosis not present

## 2018-08-06 DIAGNOSIS — M1A09X1 Idiopathic chronic gout, multiple sites, with tophus (tophi): Secondary | ICD-10-CM

## 2018-08-06 DIAGNOSIS — M1A09X0 Idiopathic chronic gout, multiple sites, without tophus (tophi): Secondary | ICD-10-CM | POA: Diagnosis not present

## 2018-08-06 NOTE — Patient Instructions (Addendum)
   If you have lab work done today you will be contacted with your lab results within the next 2 weeks.  If you have not heard from us then please contact us. The fastest way to get your results is to register for My Chart.   IF you received an x-ray today, you will receive an invoice from [facility]. Please contact [facility] at [phone] with questions or concerns regarding your invoice.   IF you received labwork today, you will receive an invoice from [facility]. Please contact [facility] at [phone] with questions or concerns regarding your invoice.   Our billing staff will not be able to assist you with questions regarding bills from these companies.  You will be contacted with the lab results as soon as they are available. The fastest way to get your results is to activate your My Chart account. Instructions are located on the last page of this paperwork. If you have not heard from us regarding the results in 2 weeks, please contact this office.     Gout  Gout is painful swelling of your joints. Gout is a type of arthritis. It is caused by having too much uric acid in your body. Uric acid is a chemical that is made when your body breaks down substances called purines. If your body has too much uric acid, sharp crystals can form and build up in your joints. This causes pain and swelling. Gout attacks can happen quickly and be very painful (acute gout). Over time, the attacks can affect more joints and happen more often (chronic gout). What are the causes?  Too much uric acid in your blood. This can happen because: ? Your kidneys do not remove enough uric acid from your blood. ? Your body makes too much uric acid. ? You eat too many foods that are high in purines. These foods include organ meats, some seafood, and beer.  Trauma or stress. What increases the risk?  Having a family history of gout.  Being female and middle-aged.  Being female and having gone  through menopause.  Being very overweight (obese).  Drinking alcohol, especially beer.  Not having enough water in the body (being dehydrated).  Losing weight too quickly.  Having an organ transplant.  Having lead poisoning.  Taking certain medicines.  Having kidney disease.  Having a skin condition called psoriasis. What are the signs or symptoms? An attack of acute gout usually happens in just one joint. The most common place is the big toe. Attacks often start at night. Other joints that may be affected include joints of the feet, ankle, knee, fingers, wrist, or elbow. Symptoms of an attack may include:  Very bad pain.  Warmth.  Swelling.  Stiffness.  Shiny, red, or purple skin.  Tenderness. The affected joint may be very painful to touch.  Chills and fever. Chronic gout may cause symptoms more often. More joints may be involved. You may also have white or yellow lumps (tophi) on your hands or feet or in other areas near your joints. How is this treated?  Treatment for this condition has two phases: treating an acute attack and preventing future attacks.  Acute gout treatment may include: ? NSAIDs. ? Steroids. These are taken by mouth or injected into a joint. ? Colchicine. This medicine relieves pain and swelling. It can be given by mouth or through an IV tube.  Preventive treatment may include: ? Taking small doses of NSAIDs or colchicine daily. ? Using a   medicine that reduces uric acid levels in your blood. ? Making changes to your diet. You may need to see a food expert (dietitian) about what to eat and drink to prevent gout. Follow these instructions at home: During a gout attack   If told, put ice on the painful area: ? Put ice in a plastic bag. ? Place a towel between your skin and the bag. ? Leave the ice on for 20 minutes, 2-3 times a day.  Raise (elevate) the painful joint above the level of your heart as often as you can.  Rest the joint as  much as possible. If the joint is in your leg, you may be given crutches.  Follow instructions from your doctor about what you cannot eat or drink. Avoiding future gout attacks  Eat a low-purine diet. Avoid foods and drinks such as: ? Liver. ? Kidney. ? Anchovies. ? Asparagus. ? Herring. ? Mushrooms. ? Mussels. ? Beer.  Stay at a healthy weight. If you want to lose weight, talk with your doctor. Do not lose weight too fast.  Start or continue an exercise plan as told by your doctor. Eating and drinking  Drink enough fluids to keep your pee (urine) pale yellow.  If you drink alcohol: ? Limit how much you use to:  0-1 drink a day for women.  0-2 drinks a day for men. ? Be aware of how much alcohol is in your drink. In the [place] one drink equals one 12 oz bottle of beer (355 mL), one 5 oz glass of wine (148 mL), or one 1 oz glass of hard liquor (44 mL). General instructions  Take over-the-counter and prescription medicines only as told by your doctor.  Do not drive or use heavy machinery while taking prescription pain medicine.  Return to your normal activities as told by your doctor. Ask your doctor what activities are safe for you.  Keep all follow-up visits as told by your doctor. This is important. Contact a doctor if:  You have another gout attack.  You still have symptoms of a gout attack after 10 days of treatment.  You have problems (side effects) because of your medicines.  You have chills or a fever.  You have burning pain when you pee (urinate).  You have pain in your lower back or belly. Get help right away if:  You have very bad pain.  Your pain cannot be controlled.  You cannot pee. Summary  Gout is painful swelling of the joints.  The most common site of pain is the big toe, but it can affect other joints.  Medicines and avoiding some foods can help to prevent and treat gout attacks. This information is not intended to replace advice given  to you by your health care provider. Make sure you discuss any questions you have with your health care provider. Document Released: 11/02/2007 Document Revised: 08/15/2017 Document Reviewed: 08/15/2017 Elsevier Patient Education  2020 [employer]  Hypertension, Adult High blood pressure (hypertension) is when the force of blood pumping through the arteries is too strong. The arteries are the blood vessels that carry blood from the heart throughout the body. Hypertension forces the heart to work harder to pump blood and may cause arteries to become narrow or stiff. Untreated or uncontrolled hypertension can cause a heart attack, heart failure, a stroke, kidney disease, and other problems. A blood pressure reading consists of a higher number over a lower number. Ideally, your blood pressure should be below 120/80.  The first ("top") number is called the systolic pressure. It is a measure of the pressure in your arteries as your heart beats. The second ("bottom") number is called the diastolic pressure. It is a measure of the pressure in your arteries as the heart relaxes. What are the causes? The exact cause of this condition is not known. There are some conditions that result in or are related to high blood pressure. What increases the risk? Some risk factors for high blood pressure are under your control. The following factors may make you more likely to develop this condition:  Smoking.  Having type 2 diabetes mellitus, high cholesterol, or both.  Not getting enough exercise or physical activity.  Being overweight.  Having too much fat, sugar, calories, or salt (sodium) in your diet.  Drinking too much alcohol. Some risk factors for high blood pressure may be difficult or impossible to change. Some of these factors include:  Having chronic kidney disease.  Having a family history of high blood pressure.  Age. Risk increases with age.  Race. You may be at higher risk if you are  African American.  Gender. Men are at higher risk than women before age 57. After age 57, women are at higher risk than men.  Having obstructive sleep apnea.  Stress. What are the signs or symptoms? High blood pressure may not cause symptoms. Very high blood pressure (hypertensive crisis) may cause:  Headache.  Anxiety.  Shortness of breath.  Nosebleed.  Nausea and vomiting.  Vision changes.  Severe chest pain.  Seizures. How is this diagnosed? This condition is diagnosed by measuring your blood pressure while you are seated, with your arm resting on a flat surface, your legs uncrossed, and your feet flat on the floor. The cuff of the blood pressure monitor will be placed directly against the skin of your upper arm at the level of your heart. It should be measured at least twice using the same arm. Certain conditions can cause a difference in blood pressure between your right and left arms. Certain factors can cause blood pressure readings to be lower or higher than normal for a short period of time:  When your blood pressure is higher when you are in a health care provider's office than when you are at home, this is called white coat hypertension. Most people with this condition do not need medicines.  When your blood pressure is higher at home than when you are in a health care provider's office, this is called masked hypertension. Most people with this condition may need medicines to control blood pressure. If you have a high blood pressure reading during one visit or you have normal blood pressure with other risk factors, you may be asked to:  Return on a different day to have your blood pressure checked again.  Monitor your blood pressure at home for 1 week or longer. If you are diagnosed with hypertension, you may have other blood or imaging tests to help your health care provider understand your overall risk for other conditions. How is this treated? This condition is  treated by making healthy lifestyle changes, such as eating healthy foods, exercising more, and reducing your alcohol intake. Your health care provider may prescribe medicine if lifestyle changes are not enough to get your blood pressure under control, and if:  Your systolic blood pressure is above 130.  Your diastolic blood pressure is above 80. Your personal target blood pressure may vary depending on your medical conditions, your  age, and other factors. Follow these instructions at home: Eating and drinking   Eat a diet that is high in fiber and potassium, and low in sodium, added sugar, and fat. An example eating plan is called the DASH (Dietary Approaches to Stop Hypertension) diet. To eat this way: ? Eat plenty of fresh fruits and vegetables. Try to fill one half of your plate at each meal with fruits and vegetables. ? Eat whole grains, such as whole-wheat pasta, brown rice, or whole-grain bread. Fill about one fourth of your plate with whole grains. ? Eat or drink low-fat dairy products, such as skim milk or low-fat yogurt. ? Avoid fatty cuts of meat, processed or cured meats, and poultry with skin. Fill about one fourth of your plate with lean proteins, such as fish, chicken without skin, beans, eggs, or tofu. ? Avoid pre-made and processed foods. These tend to be higher in sodium, added sugar, and fat.  Reduce your daily sodium intake. Most people with hypertension should eat less than 1,500 mg of sodium a day.  Do not drink alcohol if: ? Your health care provider tells you not to drink. ? You are pregnant, may be pregnant, or are planning to become pregnant.  If you drink alcohol: ? Limit how much you use to:  0-1 drink a day for women.  0-2 drinks a day for men. ? Be aware of how much alcohol is in your drink. In the [place] one drink equals one 12 oz bottle of beer (355 mL), one 5 oz glass of wine (148 mL), or one 1 oz glass of hard liquor (44 mL). Lifestyle   Work with  your health care provider to maintain a healthy body weight or to lose weight. Ask what an ideal weight is for you.  Get at least 30 minutes of exercise most days of the week. Activities may include walking, swimming, or biking.  Include exercise to strengthen your muscles (resistance exercise), such as Pilates or lifting weights, as part of your weekly exercise routine. Try to do these types of exercises for 30 minutes at least 3 days a week.  Do not use any products that contain nicotine or tobacco, such as cigarettes, e-cigarettes, and chewing tobacco. If you need help quitting, ask your health care provider.  Monitor your blood pressure at home as told by your health care provider.  Keep all follow-up visits as told by your health care provider. This is important. Medicines  Take over-the-counter and prescription medicines only as told by your health care provider. Follow directions carefully. Blood pressure medicines must be taken as prescribed.  Do not skip doses of blood pressure medicine. Doing this puts you at risk for problems and can make the medicine less effective.  Ask your health care provider about side effects or reactions to medicines that you should watch for. Contact a health care provider if you:  Think you are having a reaction to a medicine you are taking.  Have headaches that keep coming back (recurring).  Feel dizzy.  Have swelling in your ankles.  Have trouble with your vision. Get help right away if you:  Develop a severe headache or confusion.  Have unusual weakness or numbness.  Feel faint.  Have severe pain in your chest or abdomen.  Vomit repeatedly.  Have trouble breathing. Summary  Hypertension is when the force of blood pumping through your arteries is too strong. If this condition is not controlled, it may put you at risk for  serious complications.  Your personal target blood pressure may vary depending on your medical conditions, your  age, and other factors. For most people, a normal blood pressure is less than 120/80.  Hypertension is treated with lifestyle changes, medicines, or a combination of both. Lifestyle changes include losing weight, eating a healthy, low-sodium diet, exercising more, and limiting alcohol. This information is not intended to replace advice given to you by your health care provider. Make sure you discuss any questions you have with your health care provider. Document Released: 01/23/2005 Document Revised: 10/03/2017 Document Reviewed: 10/03/2017 Elsevier Patient Education  2020 [employer]

## 2018-08-06 NOTE — Progress Notes (Signed)
BP Readings from Last 3 Encounters:  01/14/18 126/76  12/31/17 138/68  11/29/17 124/84   [name] 58 y.o.   Chief Complaint  Patient presents with  . Hypertension    follow up    HISTORY OF PRESENT ILLNESS: This is a 58 y.o. female with history of hypertension and gout here for follow-up.  Has been doing well.  Last seen by me last December.  Had abnormal renal function test.  Saw nephrologist who told her everything was okay.  Taking olmesartan 40 mg daily.  Advised to decrease Aldactone dose to 25 mg a day.  Blood pressures at home have been within normal limits with systolics between 120 and 140 and diastolics in the 60s. Also has a history of gout.  Recently saw a rheumatologist.  Uric acid levels were above 7 so she was started on febuxostat 40 mg daily. Today she has no complaints or medical concerns.  HPI   Prior to Admission medications   Medication Sig Start Date End Date Taking? Authorizing Provider  febuxostat (ULORIC) 40 MG tablet Take 40 mg by mouth daily.   Yes [provider]  olmesartan (BENICAR) 40 MG tablet Take 1 tablet (40 mg total) by mouth daily. 04/04/18  Yes Aleph Nickson, Eilleen Kempf, MD  spironolactone (ALDACTONE) 50 MG tablet TAKE 1 TABLET EVERY DAY (DO NOT TAKE POTASSIUM SUPPLEMENT WITH THIS MEDICATION) 05/21/18  Yes Wesly Whisenant, Eilleen Kempf, MD  Turmeric POWD 1,950 mg by Does not apply route 3 (three) times daily.   Yes [provider]  MITIGARE 0.6 MG CAPS Take 1 capsule by mouth 2 (two) times daily. Patient not taking: Reported on 08/06/2018 10/09/17   Valarie Cones, Dema Severin, PA-C  predniSONE (DELTASONE) 5 MG tablet TAKE 1 TABLET BY MOUTH EVERY DAY WITH FOOD OR MILK 12/06/17   [provider]    Allergies  Allergen Reactions  . Doxycycline Hives, Itching and Swelling  . Norvasc [Amlodipine Besylate] Swelling    Feet swelling  . Septra [Bactrim] Hives, Itching and Swelling  . Latex Swelling and Rash    Patient Active Problem List   Diagnosis Date Noted  . 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 Class 3 severe obesity due to excess calories with serious comorbidity and body mass index (BMI) of 40.0 to 44.9 in adult (HCC) 08/18/2013  . Stress 05/22/2011  . Essential hypertension 08/07/2008    Past Medical History:  Diagnosis Date  . Allergy   . Anxiety   . Arthritis   . Chest pain 08/03/08   Urgent care visit  . Depression   . Dyspnea   . Edema   . Heart murmur   . Hypertension   . Obesity     Past Surgical History:  Procedure Laterality Date  . CESAREAN SECTION  04/16/1980  . DILATION AND CURETTAGE OF UTERUS     laparoscopic    Social History   Socioeconomic History  . Marital status: Single    Spouse name: Not on file  . Number of children: 1  . Years of education: Not on file  . Highest education level: Not on file  Occupational History  . Occupation: 3 jobs    Comment: International aid/development worker  Social Needs  . Financial resource strain: Not on file  . Food insecurity    Worry: Not on file    Inability: Not on file  . Transportation needs    Medical: Not on file    Non-medical: Not  on file  Tobacco Use  . Smoking status: Never Smoker  . Smokeless tobacco: Never Used  . Tobacco comment: Nonsmoker  Substance and Sexual Activity  . Alcohol use: No    Alcohol/week: 0.0 standard drinks  . Drug use: No  . Sexual activity: Yes    Birth control/protection: None  Lifestyle  . Physical activity    Days per week: Not on file    Minutes per session: Not on file  . Stress: Not on file  Relationships  . Social Herbalist on phone: Not on file    Gets together: Not on file    Attends religious service: Not on file    Active member of club or organization: Not on file    Attends meetings of clubs or organizations: Not on file    Relationship status: Not on file  . Intimate partner violence    Fear of current or ex partner: Not on file    Emotionally  abused: Not on file    Physically abused: Not on file    Forced sexual activity: Not on file  Other Topics Concern  . 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 Prostate cancer Father   . Congestive Heart Failure Father   . Colon cancer Maternal Uncle   . Colon polyps Mother   . Breast cancer Mother 46  . Hyperlipidemia Mother   . Hypertension Mother   . Hypertension Sister   . Hypertension Brother   . Hypertension Sister   . Hypertension Sister   . Hypertension Sister   . Hyperlipidemia Maternal Grandmother   . Hypertension Maternal Grandmother   . Esophageal cancer Neg Hx   . Stomach cancer Neg Hx   . Rectal cancer Neg Hx      Review of Systems  Constitutional: Negative.  Negative for chills and fever.  HENT: Negative.  Negative for congestion, hearing loss and sore throat.   Eyes: Negative.   Respiratory: Negative.  Negative for cough and shortness of breath.   Cardiovascular: Negative.  Negative for chest pain and palpitations.  Gastrointestinal: Negative.  Negative for abdominal pain, diarrhea, nausea and vomiting.  Genitourinary: Negative.  Negative for dysuria and hematuria.  Musculoskeletal: Positive for joint pain.  Skin: Negative.  Negative for rash.  Neurological: Negative for dizziness and headaches.  Endo/Heme/Allergies: Negative.   All other systems reviewed and are negative.   Vitals:   08/06/18 0824  BP: (!) 152/82  Pulse: 74  Resp: 16  Temp: 98.8 F (37.1 C)  SpO2: 98%    Physical Exam Vitals signs reviewed.  Constitutional:       Appearance: She is obese.  HENT:     Head: Normocephalic and atraumatic.  Eyes:     Extraocular Movements: Extraocular movements intact.     Pupils: Pupils are equal, round, and reactive to light.  Neck:     Musculoskeletal: Normal  range of motion and neck supple.  Cardiovascular:     Rate and Rhythm: Normal rate and regular rhythm.     Pulses: Normal pulses.     Heart sounds: Normal heart sounds.  Pulmonary:     Effort: Pulmonary effort is normal.     Breath sounds: Normal breath sounds.  Abdominal:     Palpations: Abdomen is soft.  Musculoskeletal: Normal range of motion.  Skin:    General: Skin is warm and dry.     Capillary Refill: Capillary refill takes less than 2 seconds.  Neurological:     General: No focal deficit present.     Mental Status: She is alert and oriented to person, place, and time.  Psychiatric:        Mood and Affect: Mood normal.        Behavior: Behavior normal.      ASSESSMENT & PLAN: Chrissy was seen today for hypertension.  Diagnoses and all orders for this visit:  Essential hypertension -     Comprehensive metabolic panel -     CBC with Differential/Platelet  Idiopathic chronic gout of multiple sites without tophus  Idiopathic chronic gout of multiple sites with tophus   Essential hypertension Well-controlled blood pressure.  Continue present medications and doses.  We will do blood work today and recheck renal function test as well as electrolytes.  Follow-up in 6 months.  Idiopathic chronic gout of multiple sites with tophus Stable.  Recently started on new medication.  Tolerating it well.  Seeing rheumatologist regularly.   Patient Instructions       If you have lab work done today you will be contacted with your lab results within the next 2 weeks.  If you have not heard from [place] then please contact us. The fastest way to get your results is to register for My Chart.   IF you received an x-ray today, you will receive an  invoice from [facility]. Please contact [facility] at [phone] with questions or concerns regarding your invoice.   IF you received labwork today, you will receive an invoice from [facility]. Please contact [facility] at [phone] with questions or concerns regarding your invoice.   Our billing staff will not be able to assist you with questions regarding bills from these companies.  You will be contacted with the lab results as soon as they are available. The fastest way to get your results is to activate your My Chart account. Instructions are located on the last page of this paperwork. If you have not heard from [place] regarding the results in 2 weeks, please contact this office.     Gout  Gout is painful swelling of your joints. Gout is a type of arthritis. It is caused by having too much uric acid in your body. Uric acid is a chemical that is made when your body breaks down substances called purines. If your body has too much uric acid, sharp crystals can form and build up in your joints. This causes pain and swelling. Gout attacks can happen quickly and be very painful (acute gout). Over time, the attacks can affect more joints and happen more often (chronic gout). What are the causes?  Too much uric acid in your blood. This can happen because: ? Your kidneys do not remove enough uric acid from your blood. ? Your body makes too much uric acid. ? You eat too many foods that are high in purines. These foods include organ meats, some seafood, and  beer.  Trauma or stress. What increases the risk?  Having a family history of gout.  Being female and middle-aged.  Being female and having gone through menopause.  Being very overweight (obese).  Drinking alcohol, especially beer.  Not having enough water in the body (being dehydrated).  Losing weight too quickly.  Having an organ transplant.  Having lead poisoning.  Taking certain medicines.  Having kidney  disease.  Having a skin condition called psoriasis. What are the signs or symptoms? An attack of acute gout usually happens in just one joint. The most common place is the big toe. Attacks often start at night. Other joints that may be affected include joints of the feet, ankle, knee, fingers, wrist, or elbow. Symptoms of an attack may include:  Very bad pain.  Warmth.  Swelling.  Stiffness.  Shiny, red, or purple skin.  Tenderness. The affected joint may be very painful to touch.  Chills and fever. Chronic gout may cause symptoms more often. More joints may be involved. You may also have white or yellow lumps (tophi) on your hands or feet or in other areas near your joints. How is this treated?  Treatment for this condition has two phases: treating an acute attack and preventing future attacks.  Acute gout treatment may include: ? NSAIDs. ? Steroids. These are taken by mouth or injected into a joint. ? Colchicine. This medicine relieves pain and swelling. It can be given by mouth or through an IV tube.  Preventive treatment may include: ? Taking small doses of NSAIDs or colchicine daily. ? Using a medicine that reduces uric acid levels in your blood. ? Making changes to your diet. You may need to see a food expert (dietitian) about what to eat and drink to prevent gout. Follow these instructions at home: During a gout attack   If told, put ice on the painful area: ? Put ice in a plastic bag. ? Place a towel between your skin and the bag. ? Leave the ice on for 20 minutes, 2-3 times a day.  Raise (elevate) the painful joint above the level of your heart as often as you can.  Rest the joint as much as possible. If the joint is in your leg, you may be given crutches.  Follow instructions from your doctor about what you cannot eat or drink. Avoiding future gout attacks  Eat a low-purine diet. Avoid foods and drinks such as: ? Liver. ? Kidney. ? Anchovies. ?  Asparagus. ? Herring. ? Mushrooms. ? Mussels. ? Beer.  Stay at a healthy weight. If you want to lose weight, talk with your doctor. Do not lose weight too fast.  Start or continue an exercise plan as told by your doctor. Eating and drinking  Drink enough fluids to keep your pee (urine) pale yellow.  If you drink alcohol: ? Limit how much you use to:  0-1 drink a day for women.  0-2 drinks a day for men. ? Be aware of how much alcohol is in your drink. In the [place] one drink equals one 12 oz bottle of beer (355 mL), one 5 oz glass of wine (148 mL), or one 1 oz glass of hard liquor (44 mL). General instructions  Take over-the-counter and prescription medicines only as told by your doctor.  Do not drive or use heavy machinery while taking prescription pain medicine.  Return to your normal activities as told by your doctor. Ask your doctor what activities are safe for you.  Keep all follow-up visits as told by your doctor. This is important. Contact a doctor if:  You have another gout attack.  You still have symptoms of a gout attack after 10 days of treatment.  You have problems (side effects) because of your medicines.  You have chills or a fever.  You have burning pain when you pee (urinate).  You have pain in your lower back or belly. Get help right away if:  You have very bad pain.  Your pain cannot be controlled.  You cannot pee. Summary  Gout is painful swelling of the joints.  The most common site of pain is the big toe, but it can affect other joints.  Medicines and avoiding some foods can help to prevent and treat gout attacks. This information is not intended to replace advice given to you by your health care provider. Make sure you discuss any questions you have with your health care provider. Document Released: 11/02/2007 Document Revised: 08/15/2017 Document Reviewed: 08/15/2017 Elsevier Patient Education  2020 [employer]  Hypertension, Adult  High blood pressure (hypertension) is when the force of blood pumping through the arteries is too strong. The arteries are the blood vessels that carry blood from the heart throughout the body. Hypertension forces the heart to work harder to pump blood and may cause arteries to become narrow or stiff. Untreated or uncontrolled hypertension can cause a heart attack, heart failure, a stroke, kidney disease, and other problems. A blood pressure reading consists of a higher number over a lower number. Ideally, your blood pressure should be below 120/80. The first ("top") number is called the systolic pressure. It is a measure of the pressure in your arteries as your heart beats. The second ("bottom") number is called the diastolic pressure. It is a measure of the pressure in your arteries as the heart relaxes. What are the causes? The exact cause of this condition is not known. There are some conditions that result in or are related to high blood pressure. What increases the risk? Some risk factors for high blood pressure are under your control. The following factors may make you more likely to develop this condition:  Smoking.  Having type 2 diabetes mellitus, high cholesterol, or both.  Not getting enough exercise or physical activity.  Being overweight.  Having too much fat, sugar, calories, or salt (sodium) in your diet.  Drinking too much alcohol. Some risk factors for high blood pressure may be difficult or impossible to change. Some of these factors include:  Having chronic kidney disease.  Having a family history of high blood pressure.  Age. Risk increases with age.  Race. You may be at higher risk if you are African American.  Gender. Men are at higher risk than women before age 57. After age 57, women are at higher risk than men.  Having obstructive sleep apnea.  Stress. What are the signs or symptoms? High blood pressure may not cause symptoms. Very high blood pressure  (hypertensive crisis) may cause:  Headache.  Anxiety.  Shortness of breath.  Nosebleed.  Nausea and vomiting.  Vision changes.  Severe chest pain.  Seizures. How is this diagnosed? This condition is diagnosed by measuring your blood pressure while you are seated, with your arm resting on a flat surface, your legs uncrossed, and your feet flat on the floor. The cuff of the blood pressure monitor will be placed directly against the skin of your upper arm at the level of your heart. It should  be measured at least twice using the same arm. Certain conditions can cause a difference in blood pressure between your right and left arms. Certain factors can cause blood pressure readings to be lower or higher than normal for a short period of time:  When your blood pressure is higher when you are in a health care provider's office than when you are at home, this is called white coat hypertension. Most people with this condition do not need medicines.  When your blood pressure is higher at home than when you are in a health care provider's office, this is called masked hypertension. Most people with this condition may need medicines to control blood pressure. If you have a high blood pressure reading during one visit or you have normal blood pressure with other risk factors, you may be asked to:  Return on a different day to have your blood pressure checked again.  Monitor your blood pressure at home for 1 week or longer. If you are diagnosed with hypertension, you may have other blood or imaging tests to help your health care provider understand your overall risk for other conditions. How is this treated? This condition is treated by making healthy lifestyle changes, such as eating healthy foods, exercising more, and reducing your alcohol intake. Your health care provider may prescribe medicine if lifestyle changes are not enough to get your blood pressure under control, and if:  Your systolic  blood pressure is above 130.  Your diastolic blood pressure is above 80. Your personal target blood pressure may vary depending on your medical conditions, your age, and other factors. Follow these instructions at home: Eating and drinking   Eat a diet that is high in fiber and potassium, and low in sodium, added sugar, and fat. An example eating plan is called the DASH (Dietary Approaches to Stop Hypertension) diet. To eat this way: ? Eat plenty of fresh fruits and vegetables. Try to fill one half of your plate at each meal with fruits and vegetables. ? Eat whole grains, such as whole-wheat pasta, brown rice, or whole-grain bread. Fill about one fourth of your plate with whole grains. ? Eat or drink low-fat dairy products, such as skim milk or low-fat yogurt. ? Avoid fatty cuts of meat, processed or cured meats, and poultry with skin. Fill about one fourth of your plate with lean proteins, such as fish, chicken without skin, beans, eggs, or tofu. ? Avoid pre-made and processed foods. These tend to be higher in sodium, added sugar, and fat.  Reduce your daily sodium intake. Most people with hypertension should eat less than 1,500 mg of sodium a day.  Do not drink alcohol if: ? Your health care provider tells you not to drink. ? You are pregnant, may be pregnant, or are planning to become pregnant.  If you drink alcohol: ? Limit how much you use to:  0-1 drink a day for women.  0-2 drinks a day for men. ? Be aware of how much alcohol is in your drink. In the [place] one drink equals one 12 oz bottle of beer (355 mL), one 5 oz glass of wine (148 mL), or one 1 oz glass of hard liquor (44 mL). Lifestyle   Work with your health care provider to maintain a healthy body weight or to lose weight. Ask what an ideal weight is for you.  Get at least 30 minutes of exercise most days of the week. Activities may include walking, swimming, or biking.  Include  exercise to strengthen your muscles  (resistance exercise), such as Pilates or lifting weights, as part of your weekly exercise routine. Try to do these types of exercises for 30 minutes at least 3 days a week.  Do not use any products that contain nicotine or tobacco, such as cigarettes, e-cigarettes, and chewing tobacco. If you need help quitting, ask your health care provider.  Monitor your blood pressure at home as told by your health care provider.  Keep all follow-up visits as told by your health care provider. This is important. Medicines  Take over-the-counter and prescription medicines only as told by your health care provider. Follow directions carefully. Blood pressure medicines must be taken as prescribed.  Do not skip doses of blood pressure medicine. Doing this puts you at risk for problems and can make the medicine less effective.  Ask your health care provider about side effects or reactions to medicines that you should watch for. Contact a health care provider if you:  Think you are having a reaction to a medicine you are taking.  Have headaches that keep coming back (recurring).  Feel dizzy.  Have swelling in your ankles.  Have trouble with your vision. Get help right away if you:  Develop a severe headache or confusion.  Have unusual weakness or numbness.  Feel faint.  Have severe pain in your chest or abdomen.  Vomit repeatedly.  Have trouble breathing. Summary  Hypertension is when the force of blood pumping through your arteries is too strong. If this condition is not controlled, it may put you at risk for serious complications.  Your personal target blood pressure may vary depending on your medical conditions, your age, and other factors. For most people, a normal blood pressure is less than 120/80.  Hypertension is treated with lifestyle changes, medicines, or a combination of both. Lifestyle changes include losing weight, eating a healthy, low-sodium diet, exercising more, and  limiting alcohol. This information is not intended to replace advice given to you by your health care provider. Make sure you discuss any questions you have with your health care provider. Document Released: 01/23/2005 Document Revised: 10/03/2017 Document Reviewed: 10/03/2017 Elsevier Patient Education  2020 Elsevier Inc.      Edwina BarthMiguel Raileigh Sabater, MD [facility]

## 2018-08-06 NOTE — Assessment & Plan Note (Signed)
Well-controlled blood pressure.  Continue present medications and doses.  We will do blood work today and recheck renal function test as well as electrolytes.  Follow-up in 6 months.

## 2018-08-06 NOTE — Assessment & Plan Note (Signed)
Stable.  Recently started on new medication.  Tolerating it well.  Seeing rheumatologist regularly.

## 2018-08-07 LAB — COMPREHENSIVE METABOLIC PANEL
ALT: 22 IU/L (ref 0–32)
AST: 22 IU/L (ref 0–40)
Albumin/Globulin Ratio: 1.5 (ref 1.2–2.2)
Albumin: 4.4 g/dL (ref 3.8–4.9)
Alkaline Phosphatase: 87 IU/L (ref 39–117)
BUN/Creatinine Ratio: 22 (ref 9–23)
BUN: 21 mg/dL (ref 6–24)
Bilirubin Total: 0.7 mg/dL (ref 0.0–1.2)
CO2: 18 mmol/L — ABNORMAL LOW (ref 20–29)
Calcium: 10 mg/dL (ref 8.7–10.2)
Chloride: 102 mmol/L (ref 96–106)
Creatinine, Ser: 0.94 mg/dL (ref 0.57–1.00)
GFR calc Af Amer: 78 mL/min/{1.73_m2} (ref >59)
GFR calc non Af Amer: 68 mL/min/{1.73_m2} (ref >59)
Globulin, Total: 3 g/dL (ref 1.5–4.5)
Glucose: 93 mg/dL (ref 65–99)
Potassium: 3.9 mmol/L (ref 3.5–5.2)
Sodium: 138 mmol/L (ref 134–144)
Total Protein: 7.4 g/dL (ref 6.0–8.5)

## 2018-08-07 LAB — CBC WITH DIFFERENTIAL/PLATELET
Basophils Absolute: 0 10*3/uL (ref 0.0–0.2)
Basos: 1 %
EOS (ABSOLUTE): 0.3 10*3/uL (ref 0.0–0.4)
Eos: 5 %
Hematocrit: 37.7 % (ref 34.0–46.6)
Hemoglobin: 11.8 g/dL (ref 11.1–15.9)
Immature Grans (Abs): 0 10*3/uL (ref 0.0–0.1)
Immature Granulocytes: 0 %
Lymphocytes Absolute: 2.2 10*3/uL (ref 0.7–3.1)
Lymphs: 40 %
MCH: 27.1 pg (ref 26.6–33.0)
MCHC: 31.3 g/dL — ABNORMAL LOW (ref 31.5–35.7)
MCV: 87 fL (ref 79–97)
Monocytes Absolute: 0.4 10*3/uL (ref 0.1–0.9)
Monocytes: 7 %
Neutrophils Absolute: 2.7 10*3/uL (ref 1.4–7.0)
Neutrophils: 47 %
Platelets: 245 10*3/uL (ref 150–450)
RBC: 4.36 x10E6/uL (ref 3.77–5.28)
RDW: 13.5 % (ref 11.7–15.4)
WBC: 5.6 10*3/uL (ref 3.4–10.8)

## 2018-08-08 ENCOUNTER — Encounter: Payer: Self-pay | Admitting: Emergency Medicine

## 2018-08-14 ENCOUNTER — Other Ambulatory Visit: Payer: Self-pay | Admitting: Emergency Medicine

## 2018-08-14 DIAGNOSIS — R601 Generalized edema: Secondary | ICD-10-CM

## 2018-08-14 NOTE — Telephone Encounter (Signed)
Forwarding medication refill to PCP for review. 

## 2018-09-11 DIAGNOSIS — Z124 Encounter for screening for malignant neoplasm of cervix: Secondary | ICD-10-CM | POA: Diagnosis not present

## 2018-09-11 DIAGNOSIS — R87610 Atypical squamous cells of undetermined significance on cytologic smear of cervix (ASC-US): Secondary | ICD-10-CM | POA: Diagnosis not present

## 2018-09-11 DIAGNOSIS — Z01419 Encounter for gynecological examination (general) (routine) without abnormal findings: Secondary | ICD-10-CM | POA: Diagnosis not present

## 2018-09-11 DIAGNOSIS — Z6841 Body Mass Index (BMI) 40.0 and over, adult: Secondary | ICD-10-CM | POA: Diagnosis not present

## 2018-09-11 LAB — HM PAP SMEAR

## 2018-09-16 ENCOUNTER — Other Ambulatory Visit: Payer: Self-pay

## 2018-09-16 ENCOUNTER — Other Ambulatory Visit (HOSPITAL_COMMUNITY)
Admission: RE | Admit: 2018-09-16 | Discharge: 2018-09-16 | Disposition: A | Discharge status: Home / Self Care | Payer: BC Managed Care – PPO | Source: Ambulatory Visit | Attending: Family Medicine | Admitting: Family Medicine

## 2018-09-16 ENCOUNTER — Ambulatory Visit: Payer: BC Managed Care – PPO | Admitting: Family Medicine

## 2018-09-16 ENCOUNTER — Encounter: Payer: Self-pay | Admitting: Family Medicine

## 2018-09-16 VITALS — BP 140/83 | HR 94 | Temp 99.0°F | Ht 59.0 in | Wt 223.4 lb

## 2018-09-16 DIAGNOSIS — N898 Other specified noninflammatory disorders of vagina: Secondary | ICD-10-CM | POA: Diagnosis not present

## 2018-09-16 DIAGNOSIS — Z202 Contact with and (suspected) exposure to infections with a predominantly sexual mode of transmission: Secondary | ICD-10-CM | POA: Diagnosis not present

## 2018-09-16 LAB — POCT URINALYSIS DIP (MANUAL ENTRY)
Bilirubin, UA: NEGATIVE
Glucose, UA: NEGATIVE mg/dL
Ketones, POC UA: NEGATIVE mg/dL
Nitrite, UA: NEGATIVE
Protein Ur, POC: NEGATIVE mg/dL
Spec Grav, UA: 1.02 (ref 1.010–1.025)
Urobilinogen, UA: 0.2 E.U./dL
pH, UA: 5.5 (ref 5.0–8.0)

## 2018-09-16 LAB — POCT WET + KOH PREP
Trich by wet prep: ABSENT
Yeast by KOH: ABSENT
Yeast by wet prep: ABSENT

## 2018-09-16 MED ORDER — CEFTRIAXONE SODIUM 250 MG IJ SOLR
250.0000 mg | Freq: Once | INTRAMUSCULAR | Status: AC
  Administered 2018-09-16: 250 mg via INTRAMUSCULAR

## 2018-09-16 MED ORDER — AZITHROMYCIN 250 MG PO TABS: 1000.0000 mg | ORAL_TABLET | Freq: Once | ORAL | 0 refills | Status: AC

## 2018-09-16 NOTE — Progress Notes (Signed)
8/10/202011:42 AM  Kendrick RanchJanice M Pearson [birth year], 58 y.o., female [id number]  Chief Complaint  Patient presents with   Vaginal Discharge    wants to be chked for sti, possible exposure to std about a wk ago. Did pap last wk, symptoms started then    HPI:   Patient is a 58 y.o. female with past medical history significant for HTN and gout who presents today for vaginal discharge  Patient has recently become sexually active after about a year Saw gyn 3 days ago for repeat pap/hpv after abnormal pap a year ago Having some discomfort with intercourse Started having vaginal discharge 2 days ago - yellowish, no odor Having mild left sided pelvic pain Denies any fever, chills, nausea, vomiting Requesting STI testing, unsure if done by gyn  [facility] 09/16/2018 08/06/2018 01/14/2018  Decreased Interest 0 0 0  Down, Depressed, Hopeless 0 0 0  PHQ - 2 Score 0 0 0  Altered sleeping - - -  Tired, decreased energy - - -  Change in appetite - - -  Feeling bad or failure about yourself  - - -  Trouble concentrating - - -  Moving slowly or fidgety/restless - - -  Suicidal thoughts - - -  PHQ-9 Score - - -  Difficult doing work/chores - - -  Some recent data might be hidden    Fall Risk  09/16/2018 08/06/2018 01/14/2018 11/29/2017 09/14/2017  Falls in the past year? 0 0 0 No No  Number falls in past yr: 0 - - - -  Injury with Fall? 0 - - - -  Follow up - Falls evaluation completed - - -     Allergies  Allergen Reactions   Doxycycline Hives, Itching and Swelling   Norvasc [Amlodipine Besylate] Swelling    Feet swelling   Septra [Bactrim] Hives, Itching and Swelling   Latex Swelling and Rash    Prior to Admission medications   Medication Sig Start Date End Date Taking? Authorizing Provider  febuxostat (ULORIC) 40 MG tablet Take 40 mg by mouth daily.   Yes [provider]  olmesartan (BENICAR) 40 MG tablet Take 1 tablet (40 mg total) by mouth daily. 04/04/18   Yes Sagardia, Eilleen KempfMiguel Jose, MD  spironolactone (ALDACTONE) 50 MG tablet TAKE 1 TABLET EVERY DAY (DO NOT TAKE POTASSIUM SUPPLEMENT WITH THIS MEDICATION) 08/17/18  Yes Sagardia, Eilleen KempfMiguel Jose, MD  Turmeric POWD 1,950 mg by Does not apply route 3 (three) times daily.   Yes [provider]    Past Medical History:  Diagnosis Date   Allergy    Anxiety    Arthritis    Chest pain 08/03/08   Urgent care visit   Depression    Dyspnea    Edema    Heart murmur    Hypertension    Obesity     Past Surgical History:  Procedure Laterality Date   CESAREAN SECTION  04/16/1980   DILATION AND CURETTAGE OF UTERUS     laparoscopic    Social History   Tobacco Use   Smoking status: Never Smoker   Smokeless tobacco: Never Used   Tobacco comment: Nonsmoker  Substance Use Topics   Alcohol use: No    Alcohol/week: 0.0 standard drinks    Family History  Problem Relation Age of Onset   Prostate cancer Father    Congestive Heart Failure Father    Colon cancer Maternal Uncle    Colon polyps Mother    Breast cancer Mother  66   Hyperlipidemia Mother    Hypertension Mother    Hypertension Sister    Hypertension Brother    Hypertension Sister    Hypertension Sister    Hypertension Sister    Hyperlipidemia Maternal Grandmother    Hypertension Maternal Grandmother    Esophageal cancer Neg Hx    Stomach cancer Neg Hx    Rectal cancer Neg Hx     ROS Per hpi  OBJECTIVE:  Today's Vitals   09/16/18 1118  BP: 140/83  Pulse: 94  Temp: 99 F (37.2 C)  TempSrc: Oral  SpO2: 97%  Weight: 223 lb 6.4 oz (101.3 kg)  Height: 4\' 11"  (1.499 m)   Body mass index is 45.12 kg/m.   Physical Exam Vitals signs and nursing note reviewed.  Constitutional:      Appearance: She is well-developed.  HENT:     Head: Normocephalic and atraumatic.  Eyes:     General: No scleral icterus.    Conjunctiva/sclera: Conjunctivae normal.     Pupils: Pupils are equal,  round, and reactive to light.  Neck:     Musculoskeletal: Neck supple.  Pulmonary:     Effort: Pulmonary effort is normal.  Skin:    General: Skin is warm and dry.  Neurological:     Mental Status: She is alert and oriented to person, place, and time.     Results for orders placed or performed in visit on 09/16/18 (from the past 24 hour(s))  POCT Wet + KOH Prep     Status: Abnormal   Collection Time: 09/16/18 11:51 AM  Result Value Ref Range   Yeast by KOH Absent Absent   Yeast by wet prep Absent Absent   WBC by wet prep Many (A) Few   Clue Cells Wet Prep HPF POC Few (A) None   Trich by wet prep Absent Absent   Bacteria Wet Prep HPF POC Moderate (A) Few   Epithelial Cells By [employer] Pref ([facility]) Moderate (A) None, Few, Too numerous to count   RBC,UR,HPF,POC Few (A) None RBC/hpf  POCT urinalysis dipstick     Status: Abnormal   Collection Time: 09/16/18 11:52 AM  Result Value Ref Range   Color, UA yellow yellow   Clarity, UA clear clear   Glucose, UA negative negative mg/dL   Bilirubin, UA negative negative   Ketones, POC UA negative negative mg/dL   Spec Grav, UA 1.020 1.010 - 1.025   Blood, UA small (A) negative   pH, UA 5.5 5.0 - 8.0   Protein Ur, POC negative negative mg/dL   Urobilinogen, UA 0.2 0.2 or 1.0 E.U./dL   Nitrite, UA Negative Negative   Leukocytes, UA Large (3+) (A) Negative    No results found.   ASSESSMENT and PLAN  1. Vaginal discharge - POCT Wet + KOH Prep - POCT urinalysis dipstick  2. Possible exposure to STD - HIV Antibody (routine testing w rflx) - Hepatitis C antibody - Urine cytology ancillary only - cefTRIAXone (ROCEPHIN) injection 250 mg  Other orders - azithromycin (ZITHROMAX) 250 MG tablet; Take 4 tablets (1,000 mg total) by mouth once for 1 dose.  Return if symptoms worsen or fail to improve.    Rutherford Guys, MD [facility] [place], [place] [address] Ph.  [phone] Fax [phone]

## 2018-09-16 NOTE — Patient Instructions (Signed)
° ° ° °  If you have lab work done today you will be contacted with your lab results within the next 2 weeks.  If you have not heard from us then please contact us. The fastest way to get your results is to register for My Chart. ° ° °IF you received an x-ray today, you will receive an invoice from [facility]. Please contact  Radiology at [phone] with questions or concerns regarding your invoice.  ° °IF you received labwork today, you will receive an invoice from [facility]. Please contact [facility] at [phone] with questions or concerns regarding your invoice.  ° °Our billing staff will not be able to assist you with questions regarding bills from these companies. ° °You will be contacted with the lab results as soon as they are available. The fastest way to get your results is to activate your My Chart account. Instructions are located on the last page of this paperwork. If you have not heard from us regarding the results in 2 weeks, please contact this office. °  ° ° ° °

## 2018-09-17 ENCOUNTER — Encounter: Payer: Self-pay | Admitting: *Deleted

## 2018-09-17 DIAGNOSIS — Z124 Encounter for screening for malignant neoplasm of cervix: Secondary | ICD-10-CM | POA: Insufficient documentation

## 2018-09-17 LAB — HIV ANTIBODY (ROUTINE TESTING W REFLEX): HIV Screen 4th Generation wRfx: NONREACTIVE

## 2018-09-17 LAB — HEPATITIS C ANTIBODY: Hep C Virus Ab: <0.1 s/co ratio (ref 0.0–0.9)

## 2018-09-17 LAB — URINE CYTOLOGY ANCILLARY ONLY
Chlamydia: NEGATIVE
Neisseria Gonorrhea: NEGATIVE
Trichomonas: POSITIVE — AB

## 2018-09-17 NOTE — Assessment & Plan Note (Signed)
09/11/2018 [facility] - Screening

## 2018-09-19 MED ORDER — METRONIDAZOLE 500 MG PO TABS: 500.0000 mg | ORAL_TABLET | Freq: Two times a day (BID) | ORAL | 0 refills | Status: DC

## 2018-09-19 NOTE — Addendum Note (Signed)
Addended by: Rutherford Guys on: 09/19/2018 08:13 AM   Modules accepted: Orders

## 2018-09-29 ENCOUNTER — Other Ambulatory Visit: Payer: Self-pay | Admitting: Emergency Medicine

## 2018-09-29 DIAGNOSIS — I10 Essential (primary) hypertension: Secondary | ICD-10-CM

## 2018-10-02 ENCOUNTER — Encounter: Payer: Self-pay | Admitting: *Deleted

## 2018-10-03 DIAGNOSIS — M1990 Unspecified osteoarthritis, unspecified site: Secondary | ICD-10-CM | POA: Diagnosis not present

## 2018-10-03 DIAGNOSIS — Z79899 Other long term (current) drug therapy: Secondary | ICD-10-CM | POA: Diagnosis not present

## 2018-10-03 DIAGNOSIS — I10 Essential (primary) hypertension: Secondary | ICD-10-CM | POA: Diagnosis not present

## 2018-10-03 DIAGNOSIS — M109 Gout, unspecified: Secondary | ICD-10-CM | POA: Diagnosis not present

## 2018-10-17 DIAGNOSIS — Z6841 Body Mass Index (BMI) 40.0 and over, adult: Secondary | ICD-10-CM | POA: Diagnosis not present

## 2018-10-17 DIAGNOSIS — R87610 Atypical squamous cells of undetermined significance on cytologic smear of cervix (ASC-US): Secondary | ICD-10-CM | POA: Diagnosis not present

## 2018-10-17 DIAGNOSIS — R87820 Cervical low risk human papillomavirus (HPV) DNA test positive: Secondary | ICD-10-CM | POA: Diagnosis not present

## 2018-10-22 ENCOUNTER — Encounter: Payer: Self-pay | Admitting: Emergency Medicine

## 2018-10-24 ENCOUNTER — Encounter: Payer: Self-pay | Admitting: *Deleted

## 2018-10-25 ENCOUNTER — Other Ambulatory Visit: Payer: Self-pay | Admitting: Emergency Medicine

## 2018-10-25 DIAGNOSIS — R601 Generalized edema: Secondary | ICD-10-CM

## 2018-10-25 NOTE — Telephone Encounter (Signed)
Medication Refill - Medication: spironolactone (ALDACTONE) 50 MG tablet   Has the patient contacted their pharmacy? Yes.   (Agent: If no, request that the patient contact the pharmacy for the refill.) (Agent: If yes, when and what did the pharmacy advise?)  Preferred Pharmacy (with phone number or street name):  [id number] - [place], [place] AT [address] [place] [phone] (Phone) [phone] (Fax)     Agent: Please be advised that RX refills may take up to 3 business days. We ask that you follow-up with your pharmacy.

## 2018-10-31 ENCOUNTER — Encounter: Payer: Self-pay | Admitting: *Deleted

## 2018-12-06 DIAGNOSIS — Z1231 Encounter for screening mammogram for malignant neoplasm of breast: Secondary | ICD-10-CM | POA: Diagnosis not present

## 2019-01-10 ENCOUNTER — Other Ambulatory Visit: Payer: Self-pay

## 2019-01-10 DIAGNOSIS — Z20822 Contact with and (suspected) exposure to covid-19: Secondary | ICD-10-CM

## 2019-01-13 LAB — NOVEL CORONAVIRUS, NAA: SARS-CoV-2, NAA: NOT DETECTED

## 2019-01-14 ENCOUNTER — Ambulatory Visit: Payer: BC Managed Care – PPO | Admitting: Registered Nurse

## 2019-01-16 ENCOUNTER — Encounter: Payer: BC Managed Care – PPO | Admitting: Emergency Medicine

## 2019-01-21 ENCOUNTER — Ambulatory Visit (INDEPENDENT_AMBULATORY_CARE_PROVIDER_SITE_OTHER): Payer: BC Managed Care – PPO | Admitting: Emergency Medicine

## 2019-01-21 ENCOUNTER — Encounter: Payer: Self-pay | Admitting: Emergency Medicine

## 2019-01-21 ENCOUNTER — Encounter: Payer: Self-pay | Admitting: *Deleted

## 2019-01-21 ENCOUNTER — Other Ambulatory Visit: Payer: Self-pay

## 2019-01-21 VITALS — BP 168/79 | HR 75 | Temp 97.6°F | Resp 16 | Ht 59.0 in | Wt 229.6 lb

## 2019-01-21 DIAGNOSIS — M1A09X0 Idiopathic chronic gout, multiple sites, without tophus (tophi): Secondary | ICD-10-CM

## 2019-01-21 DIAGNOSIS — Z0001 Encounter for general adult medical examination with abnormal findings: Secondary | ICD-10-CM

## 2019-01-21 DIAGNOSIS — I10 Essential (primary) hypertension: Secondary | ICD-10-CM | POA: Diagnosis not present

## 2019-01-21 DIAGNOSIS — Z0000 Encounter for general adult medical examination without abnormal findings: Secondary | ICD-10-CM

## 2019-01-21 NOTE — Progress Notes (Signed)
Samantha Pearson 58 y.o.   Chief Complaint  Patient presents with  . Annual Exam    no pap    HISTORY OF PRESENT ILLNESS: This is a 58 y.o. female with history of hypertension here for her annual exam. Doing well.  Has no complaints or medical concerns today.  HPI   Prior to Admission medications   Medication Sig Start Date End Date Taking? Authorizing Provider  olmesartan (BENICAR) 40 MG tablet TAKE 1 TABLET BY MOUTH EVERY DAY 09/29/18  Yes Yesica Kemler, Ines Bloomer, MD  spironolactone (ALDACTONE) 50 MG tablet TAKE 1 TABLET EVERY DAY (DO NOT TAKE POTASSIUM SUPPLEMENT WITH THIS MEDICATION) 08/17/18  Yes Samay Delcarlo, Ines Bloomer, MD  Turmeric POWD 1,950 mg by Does not apply route 3 (three) times daily.   Yes [provider]  febuxostat (ULORIC) 40 MG tablet Take 40 mg by mouth daily.    [provider]  metroNIDAZOLE (FLAGYL) 500 MG tablet Take 1 tablet (500 mg total) by mouth 2 (two) times daily. Patient not taking: Reported on 01/21/2019 09/19/18   Rutherford Guys, MD    Allergies  Allergen Reactions  . Doxycycline Hives, Itching and Swelling  . Norvasc [Amlodipine Besylate] Swelling    Feet swelling  . Septra [Bactrim] Hives, Itching and Swelling  . Latex Swelling and Rash    Patient Active Problem List   Diagnosis Date Noted  . 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 Class 3 severe obesity due to excess calories with serious comorbidity and body mass index (BMI) of 40.0 to 44.9 in adult ([place]) 08/18/2013  . Essential hypertension 08/07/2008    Past Medical History:  Diagnosis Date  . Allergy   . Anxiety   . Arthritis   . Chest pain 08/03/08   Urgent care visit  . Depression   . Dyspnea   . Edema   . Heart murmur   . Hypertension   . Obesity     Past Surgical History:  Procedure Laterality Date  . CESAREAN SECTION  04/16/1980  . DILATION AND CURETTAGE OF UTERUS     laparoscopic    Social History   Socioeconomic  History  . Marital status: Single    Spouse name: Not on file  . Number of children: 1  . Years of education: Not on file  . Highest education level: Not on file  Occupational History  . Occupation: 3 jobs    Comment: Paramedic  Tobacco Use  . Smoking status: Never Smoker  . Smokeless tobacco: Never Used  . Tobacco comment: Nonsmoker  Substance and Sexual Activity  . Alcohol use: No    Alcohol/week: 0.0 standard drinks  . Drug use: No  . Sexual activity: Yes    Birth control/protection: None  Other Topics Concern  . 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 Difficulty of Paying Living Expenses: Not on  file  Food Insecurity:   . Worried About Programme researcher, broadcasting/film/video in the Last Year: Not on file  . Ran Out of Food in the Last Year: Not on file  Transportation Needs:   . Lack of Transportation (Medical): Not on file  . Lack of Transportation (Non-Medical): Not on file  Physical Activity:   . Days of Exercise per Week: Not on file  . Minutes of Exercise per Session: Not on file  Stress:   . Feeling of Stress : Not on file  Social Connections:   . Frequency of Communication with Friends and Family: Not on file  . Frequency of Social Gatherings with Friends and Family: Not on file  . Attends Religious Services: Not on file  . Active Member of Clubs or Organizations: Not on file  . Attends Banker Meetings: Not on file  . Marital Status: Not on file  Intimate Partner Violence:   . Fear of Current or Ex-Partner: Not on file    . Emotionally Abused: Not on file  . Physically Abused: Not on file  . Sexually Abused: Not on file    Family History  Problem Relation Age of Onset  . Prostate cancer Father   . Congestive Heart Failure Father   . Colon cancer Maternal Uncle   . Colon polyps Mother   . Breast cancer Mother 79  . Hyperlipidemia Mother   . Hypertension Mother   . Hypertension Sister   . Hypertension Brother   . Hypertension Sister   . Hypertension Sister   . Hypertension Sister   . Hyperlipidemia Maternal Grandmother   . Hypertension Maternal Grandmother   . Esophageal cancer Neg Hx   . Stomach cancer Neg Hx   . Rectal cancer Neg Hx      Review of Systems  Constitutional: Negative.  Negative for chills and fever.  HENT: Negative.  Negative for congestion and sore throat.   Eyes: Negative.   Respiratory: Negative.  Negative for cough and shortness of breath.   Cardiovascular: Negative.  Negative for chest pain and palpitations.  Gastrointestinal: Negative.  Negative for abdominal pain, diarrhea, nausea and vomiting.  Genitourinary: Negative.  Negative for dysuria and hematuria.  Musculoskeletal: Negative.  Negative for myalgias.  Skin: Negative.  Negative for rash.  Neurological: Negative.  Negative for dizziness and headaches.  All other systems reviewed and are negative.  Vitals:   01/21/19 1016  BP: (!) 168/79  Pulse: 75  Resp: 16  Temp: 97.6 F (36.4 C)  SpO2: 100%     Physical Exam Vitals reviewed.  Constitutional:      Appearance: Normal appearance.  HENT:     Head: Normocephalic.  Eyes:     Extraocular Movements: Extraocular movements intact.     Conjunctiva/sclera: Conjunctivae normal.     Pupils: Pupils are equal, round, and reactive to light.  Cardiovascular:     Rate and Rhythm: Normal rate and regular rhythm.     Pulses: Normal pulses.     Heart sounds: Normal heart sounds.  Pulmonary:     Effort: Pulmonary effort is normal.     Breath sounds: Normal  breath sounds.  Abdominal:     Palpations: Abdomen is soft.     Tenderness: There is no abdominal tenderness.  Musculoskeletal:        General: Normal range of motion.     Cervical back: Normal range of motion and neck supple.  Skin:    General: Skin is warm and  dry.     Capillary Refill: Capillary refill takes less than 2 seconds.  Neurological:     General: No focal deficit present.     Mental Status: She is alert and oriented to person, place, and time.  Psychiatric:        Mood and Affect: Mood normal.        Behavior: Behavior normal.      ASSESSMENT & PLAN: Samantha Pearson was seen today for annual exam.  Diagnoses and all orders for this visit:  Routine general medical examination at a health care facility  Essential hypertension  Idiopathic chronic gout of multiple sites without tophus    Patient Instructions       If you have lab work done today you will be contacted with your lab results within the next 2 weeks.  If you have not heard from [place] then please contact us. The fastest way to get your results is to register for My Chart.   IF you received an x-ray today, you will receive an invoice from [facility]. Please contact [facility] at [phone] with questions or concerns regarding your invoice.   IF you received labwork today, you will receive an invoice from [facility]. Please contact [facility] at [phone] with questions or concerns regarding your invoice.   Our billing staff will not be able to assist you with questions regarding bills from these companies.  You will be contacted with the lab results as soon as they are available. The fastest way to get your results is to activate your My Chart account. Instructions are located on the last page of this paperwork. If you have not heard from [place] regarding the results in 2 weeks, please contact this office.     Hypertension, Adult High blood pressure (hypertension) is when the force of  blood pumping through the arteries is too strong. The arteries are the blood vessels that carry blood from the heart throughout the body. Hypertension forces the heart to work harder to pump blood and may cause arteries to become narrow or stiff. Untreated or uncontrolled hypertension can cause a heart attack, heart failure, a stroke, kidney disease, and other problems. A blood pressure reading consists of a higher number over a lower number. Ideally, your blood pressure should be below 120/80. The first ("top") number is called the systolic pressure. It is a measure of the pressure in your arteries as your heart beats. The second ("bottom") number is called the diastolic pressure. It is a measure of the pressure in your arteries as the heart relaxes. What are the causes? The exact cause of this condition is not known. There are some conditions that result in or are related to high blood pressure. What increases the risk? Some risk factors for high blood pressure are under your control. The following factors may make you more likely to develop this condition:  Smoking.  Having type 2 diabetes mellitus, high cholesterol, or both.  Not getting enough exercise or physical activity.  Being overweight.  Having too much fat, sugar, calories, or salt (sodium) in your diet.  Drinking too much alcohol. Some risk factors for high blood pressure may be difficult or impossible to change. Some of these factors include:  Having chronic kidney disease.  Having a family history of high blood pressure.  Age. Risk increases with age.  Race. You may be at higher risk if you are African American.  Gender. Men are at higher risk than women before age 59. After age  65, women are at higher risk than men.  Having obstructive sleep apnea.  Stress. What are the signs or symptoms? High blood pressure may not cause symptoms. Very high blood pressure (hypertensive crisis) may  cause:  Headache.  Anxiety.  Shortness of breath.  Nosebleed.  Nausea and vomiting.  Vision changes.  Severe chest pain.  Seizures. How is this diagnosed? This condition is diagnosed by measuring your blood pressure while you are seated, with your arm resting on a flat surface, your legs uncrossed, and your feet flat on the floor. The cuff of the blood pressure monitor will be placed directly against the skin of your upper arm at the level of your heart. It should be measured at least twice using the same arm. Certain conditions can cause a difference in blood pressure between your right and left arms. Certain factors can cause blood pressure readings to be lower or higher than normal for a short period of time:  When your blood pressure is higher when you are in a health care provider's office than when you are at home, this is called white coat hypertension. Most people with this condition do not need medicines.  When your blood pressure is higher at home than when you are in a health care provider's office, this is called masked hypertension. Most people with this condition may need medicines to control blood pressure. If you have a high blood pressure reading during one visit or you have normal blood pressure with other risk factors, you may be asked to:  Return on a different day to have your blood pressure checked again.  Monitor your blood pressure at home for 1 week or longer. If you are diagnosed with hypertension, you may have other blood or imaging tests to help your health care provider understand your overall risk for other conditions. How is this treated? This condition is treated by making healthy lifestyle changes, such as eating healthy foods, exercising more, and reducing your alcohol intake. Your health care provider may prescribe medicine if lifestyle changes are not enough to get your blood pressure under control, and if:  Your systolic blood pressure is above  130.  Your diastolic blood pressure is above 80. Your personal target blood pressure may vary depending on your medical conditions, your age, and other factors. Follow these instructions at home: Eating and drinking   Eat a diet that is high in fiber and potassium, and low in sodium, added sugar, and fat. An example eating plan is called the DASH (Dietary Approaches to Stop Hypertension) diet. To eat this way: ? Eat plenty of fresh fruits and vegetables. Try to fill one half of your plate at each meal with fruits and vegetables. ? Eat whole grains, such as whole-wheat pasta, brown rice, or whole-grain bread. Fill about one fourth of your plate with whole grains. ? Eat or drink low-fat dairy products, such as skim milk or low-fat yogurt. ? Avoid fatty cuts of meat, processed or cured meats, and poultry with skin. Fill about one fourth of your plate with lean proteins, such as fish, chicken without skin, beans, eggs, or tofu. ? Avoid pre-made and processed foods. These tend to be higher in sodium, added sugar, and fat.  Reduce your daily sodium intake. Most people with hypertension should eat less than 1,500 mg of sodium a day.  Do not drink alcohol if: ? Your health care provider tells you not to drink. ? You are pregnant, may be pregnant, or are planning  to become pregnant.  If you drink alcohol: ? Limit how much you use to:  0-1 drink a day for women.  0-2 drinks a day for men. ? Be aware of how much alcohol is in your drink. In the [place] one drink equals one 12 oz bottle of beer (355 mL), one 5 oz glass of wine (148 mL), or one 1 oz glass of hard liquor (44 mL). Lifestyle   Work with your health care provider to maintain a healthy body weight or to lose weight. Ask what an ideal weight is for you.  Get at least 30 minutes of exercise most days of the week. Activities may include walking, swimming, or biking.  Include exercise to strengthen your muscles (resistance exercise),  such as Pilates or lifting weights, as part of your weekly exercise routine. Try to do these types of exercises for 30 minutes at least 3 days a week.  Do not use any products that contain nicotine or tobacco, such as cigarettes, e-cigarettes, and chewing tobacco. If you need help quitting, ask your health care provider.  Monitor your blood pressure at home as told by your health care provider.  Keep all follow-up visits as told by your health care provider. This is important. Medicines  Take over-the-counter and prescription medicines only as told by your health care provider. Follow directions carefully. Blood pressure medicines must be taken as prescribed.  Do not skip doses of blood pressure medicine. Doing this puts you at risk for problems and can make the medicine less effective.  Ask your health care provider about side effects or reactions to medicines that you should watch for. Contact a health care provider if you:  Think you are having a reaction to a medicine you are taking.  Have headaches that keep coming back (recurring).  Feel dizzy.  Have swelling in your ankles.  Have trouble with your vision. Get help right away if you:  Develop a severe headache or confusion.  Have unusual weakness or numbness.  Feel faint.  Have severe pain in your chest or abdomen.  Vomit repeatedly.  Have trouble breathing. Summary  Hypertension is when the force of blood pumping through your arteries is too strong. If this condition is not controlled, it may put you at risk for serious complications.  Your personal target blood pressure may vary depending on your medical conditions, your age, and other factors. For most people, a normal blood pressure is less than 120/80.  Hypertension is treated with lifestyle changes, medicines, or a combination of both. Lifestyle changes include losing weight, eating a healthy, low-sodium diet, exercising more, and limiting alcohol. This  information is not intended to replace advice given to you by your health care provider. Make sure you discuss any questions you have with your health care provider. Document Released: 01/23/2005 Document Revised: 10/03/2017 Document Reviewed: 10/03/2017 Elsevier Patient Education  2020 [employer]   Health Maintenance, Female Adopting a healthy lifestyle and getting preventive care are important in promoting health and wellness. Ask your health care provider about:  The right schedule for you to have regular tests and exams.  Things you can do on your own to prevent diseases and keep yourself healthy. What should I know about diet, weight, and exercise? Eat a healthy diet   Eat a diet that includes plenty of vegetables, fruits, low-fat dairy products, and lean protein.  Do not eat a lot of foods that are high in solid fats, added sugars, or sodium. Maintain  a healthy weight Body mass index (BMI) is used to identify weight problems. It estimates body fat based on height and weight. Your health care provider can help determine your BMI and help you achieve or maintain a healthy weight. Get regular exercise Get regular exercise. This is one of the most important things you can do for your health. Most adults should:  Exercise for at least 150 minutes each week. The exercise should increase your heart rate and make you sweat (moderate-intensity exercise).  Do strengthening exercises at least twice a week. This is in addition to the moderate-intensity exercise.  Spend less time sitting. Even light physical activity can be beneficial. Watch cholesterol and blood lipids Have your blood tested for lipids and cholesterol at 57 years of age, then have this test every 5 years. Have your cholesterol levels checked more often if:  Your lipid or cholesterol levels are high.  You are older than 57 years of age.  You are at high risk for heart disease. What should I know about cancer  screening? Depending on your health history and family history, you may need to have cancer screening at various ages. This may include screening for:  Breast cancer.  Cervical cancer.  Colorectal cancer.  Skin cancer.  Lung cancer. What should I know about heart disease, diabetes, and high blood pressure? Blood pressure and heart disease  High blood pressure causes heart disease and increases the risk of stroke. This is more likely to develop in people who have high blood pressure readings, are of African descent, or are overweight.  Have your blood pressure checked: ? Every 3-5 years if you are 41-39 years of age. ? Every year if you are 35 years old or older. Diabetes Have regular diabetes screenings. This checks your fasting blood sugar level. Have the screening done:  Once every three years after age 11 if you are at a normal weight and have a low risk for diabetes.  More often and at a younger age if you are overweight or have a high risk for diabetes. What should I know about preventing infection? Hepatitis B If you have a higher risk for hepatitis B, you should be screened for this virus. Talk with your health care provider to find out if you are at risk for hepatitis B infection. Hepatitis C Testing is recommended for:  Everyone born from [birth year] through 1964.  Anyone with known risk factors for hepatitis C. Sexually transmitted infections (STIs)  Get screened for STIs, including gonorrhea and chlamydia, if: ? You are sexually active and are younger than 57 years of age. ? You are older than 57 years of age and your health care provider tells you that you are at risk for this type of infection. ? Your sexual activity has changed since you were last screened, and you are at increased risk for chlamydia or gonorrhea. Ask your health care provider if you are at risk.  Ask your health care provider about whether you are at high risk for HIV. Your health care provider may  recommend a prescription medicine to help prevent HIV infection. If you choose to take medicine to prevent HIV, you should first get tested for HIV. You should then be tested every 3 months for as long as you are taking the medicine. Pregnancy  If you are about to stop having your period (premenopausal) and you may become pregnant, seek counseling before you get pregnant.  Take 400 to 800 micrograms (mcg) of folic acid  every day if you become pregnant.  Ask for birth control (contraception) if you want to prevent pregnancy. Osteoporosis and menopause Osteoporosis is a disease in which the bones lose minerals and strength with aging. This can result in bone fractures. If you are 56 years old or older, or if you are at risk for osteoporosis and fractures, ask your health care provider if you should:  Be screened for bone loss.  Take a calcium or vitamin D supplement to lower your risk of fractures.  Be given hormone replacement therapy (HRT) to treat symptoms of menopause. Follow these instructions at home: Lifestyle  Do not use any products that contain nicotine or tobacco, such as cigarettes, e-cigarettes, and chewing tobacco. If you need help quitting, ask your health care provider.  Do not use street drugs.  Do not share needles.  Ask your health care provider for help if you need support or information about quitting drugs. Alcohol use  Do not drink alcohol if: ? Your health care provider tells you not to drink. ? You are pregnant, may be pregnant, or are planning to become pregnant.  If you drink alcohol: ? Limit how much you use to 0-1 drink a day. ? Limit intake if you are breastfeeding.  Be aware of how much alcohol is in your drink. In the [place] one drink equals one 12 oz bottle of beer (355 mL), one 5 oz glass of wine (148 mL), or one 1 oz glass of hard liquor (44 mL). General instructions  Schedule regular health, dental, and eye exams.  Stay current with your  vaccines.  Tell your health care provider if: ? You often feel depressed. ? You have ever been abused or do not feel safe at home. Summary  Adopting a healthy lifestyle and getting preventive care are important in promoting health and wellness.  Follow your health care provider's instructions about healthy diet, exercising, and getting tested or screened for diseases.  Follow your health care provider's instructions on monitoring your cholesterol and blood pressure. This information is not intended to replace advice given to you by your health care provider. Make sure you discuss any questions you have with your health care provider. Document Released: 08/08/2010 Document Revised: 01/16/2018 Document Reviewed: 01/16/2018 Elsevier Patient Education  2020 Elsevier Inc.      Edwina Barth, MD [facility]

## 2019-01-21 NOTE — Patient Instructions (Addendum)
   If you have lab work done today you will be contacted with your lab results within the next 2 weeks.  If you have not heard from us then please contact us. The fastest way to get your results is to register for My Chart.   IF you received an x-ray today, you will receive an invoice from [facility]. Please contact [facility] at [phone] with questions or concerns regarding your invoice.   IF you received labwork today, you will receive an invoice from [facility]. Please contact [facility] at [phone] with questions or concerns regarding your invoice.   Our billing staff will not be able to assist you with questions regarding bills from these companies.  You will be contacted with the lab results as soon as they are available. The fastest way to get your results is to activate your My Chart account. Instructions are located on the last page of this paperwork. If you have not heard from us regarding the results in 2 weeks, please contact this office.     Hypertension, Adult High blood pressure (hypertension) is when the force of blood pumping through the arteries is too strong. The arteries are the blood vessels that carry blood from the heart throughout the body. Hypertension forces the heart to work harder to pump blood and may cause arteries to become narrow or stiff. Untreated or uncontrolled hypertension can cause a heart attack, heart failure, a stroke, kidney disease, and other problems. A blood pressure reading consists of a higher number over a lower number. Ideally, your blood pressure should be below 120/80. The first ("top") number is called the systolic pressure. It is a measure of the pressure in your arteries as your heart beats. The second ("bottom") number is called the diastolic pressure. It is a measure of the pressure in your arteries as the heart relaxes. What are the causes? The exact cause of this condition is not known. There are some conditions  that result in or are related to high blood pressure. What increases the risk? Some risk factors for high blood pressure are under your control. The following factors may make you more likely to develop this condition:  Smoking.  Having type 2 diabetes mellitus, high cholesterol, or both.  Not getting enough exercise or physical activity.  Being overweight.  Having too much fat, sugar, calories, or salt (sodium) in your diet.  Drinking too much alcohol. Some risk factors for high blood pressure may be difficult or impossible to change. Some of these factors include:  Having chronic kidney disease.  Having a family history of high blood pressure.  Age. Risk increases with age.  Race. You may be at higher risk if you are African American.  Gender. Men are at higher risk than women before age 45. After age 65, women are at higher risk than men.  Having obstructive sleep apnea.  Stress. What are the signs or symptoms? High blood pressure may not cause symptoms. Very high blood pressure (hypertensive crisis) may cause:  Headache.  Anxiety.  Shortness of breath.  Nosebleed.  Nausea and vomiting.  Vision changes.  Severe chest pain.  Seizures. How is this diagnosed? This condition is diagnosed by measuring your blood pressure while you are seated, with your arm resting on a flat surface, your legs uncrossed, and your feet flat on the floor. The cuff of the blood pressure monitor will be placed directly against the skin of your upper arm at the level of your heart.   It should be measured at least twice using the same arm. Certain conditions can cause a difference in blood pressure between your right and left arms. Certain factors can cause blood pressure readings to be lower or higher than normal for a short period of time:  When your blood pressure is higher when you are in a health care provider's office than when you are at home, this is called white coat hypertension.  Most people with this condition do not need medicines.  When your blood pressure is higher at home than when you are in a health care provider's office, this is called masked hypertension. Most people with this condition may need medicines to control blood pressure. If you have a high blood pressure reading during one visit or you have normal blood pressure with other risk factors, you may be asked to:  Return on a different day to have your blood pressure checked again.  Monitor your blood pressure at home for 1 week or longer. If you are diagnosed with hypertension, you may have other blood or imaging tests to help your health care provider understand your overall risk for other conditions. How is this treated? This condition is treated by making healthy lifestyle changes, such as eating healthy foods, exercising more, and reducing your alcohol intake. Your health care provider may prescribe medicine if lifestyle changes are not enough to get your blood pressure under control, and if:  Your systolic blood pressure is above 130.  Your diastolic blood pressure is above 80. Your personal target blood pressure may vary depending on your medical conditions, your age, and other factors. Follow these instructions at home: Eating and drinking   Eat a diet that is high in fiber and potassium, and low in sodium, added sugar, and fat. An example eating plan is called the DASH (Dietary Approaches to Stop Hypertension) diet. To eat this way: ? Eat plenty of fresh fruits and vegetables. Try to fill one half of your plate at each meal with fruits and vegetables. ? Eat whole grains, such as whole-wheat pasta, brown rice, or whole-grain bread. Fill about one fourth of your plate with whole grains. ? Eat or drink low-fat dairy products, such as skim milk or low-fat yogurt. ? Avoid fatty cuts of meat, processed or cured meats, and poultry with skin. Fill about one fourth of your plate with lean proteins, such  as fish, chicken without skin, beans, eggs, or tofu. ? Avoid pre-made and processed foods. These tend to be higher in sodium, added sugar, and fat.  Reduce your daily sodium intake. Most people with hypertension should eat less than 1,500 mg of sodium a day.  Do not drink alcohol if: ? Your health care provider tells you not to drink. ? You are pregnant, may be pregnant, or are planning to become pregnant.  If you drink alcohol: ? Limit how much you use to:  0-1 drink a day for women.  0-2 drinks a day for men. ? Be aware of how much alcohol is in your drink. In the [place] one drink equals one 12 oz bottle of beer (355 mL), one 5 oz glass of wine (148 mL), or one 1 oz glass of hard liquor (44 mL). Lifestyle   Work with your health care provider to maintain a healthy body weight or to lose weight. Ask what an ideal weight is for you.  Get at least 30 minutes of exercise most days of the week. Activities may include walking, swimming, or   biking.  Include exercise to strengthen your muscles (resistance exercise), such as Pilates or lifting weights, as part of your weekly exercise routine. Try to do these types of exercises for 30 minutes at least 3 days a week.  Do not use any products that contain nicotine or tobacco, such as cigarettes, e-cigarettes, and chewing tobacco. If you need help quitting, ask your health care provider.  Monitor your blood pressure at home as told by your health care provider.  Keep all follow-up visits as told by your health care provider. This is important. Medicines  Take over-the-counter and prescription medicines only as told by your health care provider. Follow directions carefully. Blood pressure medicines must be taken as prescribed.  Do not skip doses of blood pressure medicine. Doing this puts you at risk for problems and can make the medicine less effective.  Ask your health care provider about side effects or reactions to medicines that you  should watch for. Contact a health care provider if you:  Think you are having a reaction to a medicine you are taking.  Have headaches that keep coming back (recurring).  Feel dizzy.  Have swelling in your ankles.  Have trouble with your vision. Get help right away if you:  Develop a severe headache or confusion.  Have unusual weakness or numbness.  Feel faint.  Have severe pain in your chest or abdomen.  Vomit repeatedly.  Have trouble breathing. Summary  Hypertension is when the force of blood pumping through your arteries is too strong. If this condition is not controlled, it may put you at risk for serious complications.  Your personal target blood pressure may vary depending on your medical conditions, your age, and other factors. For most people, a normal blood pressure is less than 120/80.  Hypertension is treated with lifestyle changes, medicines, or a combination of both. Lifestyle changes include losing weight, eating a healthy, low-sodium diet, exercising more, and limiting alcohol. This information is not intended to replace advice given to you by your health care provider. Make sure you discuss any questions you have with your health care provider. Document Released: 01/23/2005 Document Revised: 10/03/2017 Document Reviewed: 10/03/2017 Elsevier Patient Education  2020 [employer]   Health Maintenance, Female Adopting a healthy lifestyle and getting preventive care are important in promoting health and wellness. Ask your health care provider about:  The right schedule for you to have regular tests and exams.  Things you can do on your own to prevent diseases and keep yourself healthy. What should I know about diet, weight, and exercise? Eat a healthy diet   Eat a diet that includes plenty of vegetables, fruits, low-fat dairy products, and lean protein.  Do not eat a lot of foods that are high in solid fats, added sugars, or sodium. Maintain a healthy  weight Body mass index (BMI) is used to identify weight problems. It estimates body fat based on height and weight. Your health care provider can help determine your BMI and help you achieve or maintain a healthy weight. Get regular exercise Get regular exercise. This is one of the most important things you can do for your health. Most adults should:  Exercise for at least 150 minutes each week. The exercise should increase your heart rate and make you sweat (moderate-intensity exercise).  Do strengthening exercises at least twice a week. This is in addition to the moderate-intensity exercise.  Spend less time sitting. Even light physical activity can be beneficial. Watch cholesterol and blood  lipids Have your blood tested for lipids and cholesterol at 57 years of age, then have this test every 5 years. Have your cholesterol levels checked more often if:  Your lipid or cholesterol levels are high.  You are older than 57 years of age.  You are at high risk for heart disease. What should I know about cancer screening? Depending on your health history and family history, you may need to have cancer screening at various ages. This may include screening for:  Breast cancer.  Cervical cancer.  Colorectal cancer.  Skin cancer.  Lung cancer. What should I know about heart disease, diabetes, and high blood pressure? Blood pressure and heart disease  High blood pressure causes heart disease and increases the risk of stroke. This is more likely to develop in people who have high blood pressure readings, are of African descent, or are overweight.  Have your blood pressure checked: ? Every 3-5 years if you are 95-77 years of age. ? Every year if you are 59 years old or older. Diabetes Have regular diabetes screenings. This checks your fasting blood sugar level. Have the screening done:  Once every three years after age 60 if you are at a normal weight and have a low risk for  diabetes.  More often and at a younger age if you are overweight or have a high risk for diabetes. What should I know about preventing infection? Hepatitis B If you have a higher risk for hepatitis B, you should be screened for this virus. Talk with your health care provider to find out if you are at risk for hepatitis B infection. Hepatitis C Testing is recommended for:  Everyone born from [birth year] through 1964.  Anyone with known risk factors for hepatitis C. Sexually transmitted infections (STIs)  Get screened for STIs, including gonorrhea and chlamydia, if: ? You are sexually active and are younger than 57 years of age. ? You are older than 57 years of age and your health care provider tells you that you are at risk for this type of infection. ? Your sexual activity has changed since you were last screened, and you are at increased risk for chlamydia or gonorrhea. Ask your health care provider if you are at risk.  Ask your health care provider about whether you are at high risk for HIV. Your health care provider may recommend a prescription medicine to help prevent HIV infection. If you choose to take medicine to prevent HIV, you should first get tested for HIV. You should then be tested every 3 months for as long as you are taking the medicine. Pregnancy  If you are about to stop having your period (premenopausal) and you may become pregnant, seek counseling before you get pregnant.  Take 400 to 800 micrograms (mcg) of folic acid every day if you become pregnant.  Ask for birth control (contraception) if you want to prevent pregnancy. Osteoporosis and menopause Osteoporosis is a disease in which the bones lose minerals and strength with aging. This can result in bone fractures. If you are 81 years old or older, or if you are at risk for osteoporosis and fractures, ask your health care provider if you should:  Be screened for bone loss.  Take a calcium or vitamin D supplement to lower  your risk of fractures.  Be given hormone replacement therapy (HRT) to treat symptoms of menopause. Follow these instructions at home: Lifestyle  Do not use any products that contain nicotine or tobacco, such as  cigarettes, e-cigarettes, and chewing tobacco. If you need help quitting, ask your health care provider.  Do not use street drugs.  Do not share needles.  Ask your health care provider for help if you need support or information about quitting drugs. Alcohol use  Do not drink alcohol if: ? Your health care provider tells you not to drink. ? You are pregnant, may be pregnant, or are planning to become pregnant.  If you drink alcohol: ? Limit how much you use to 0-1 drink a day. ? Limit intake if you are breastfeeding.  Be aware of how much alcohol is in your drink. In the [place] one drink equals one 12 oz bottle of beer (355 mL), one 5 oz glass of wine (148 mL), or one 1 oz glass of hard liquor (44 mL). General instructions  Schedule regular health, dental, and eye exams.  Stay current with your vaccines.  Tell your health care provider if: ? You often feel depressed. ? You have ever been abused or do not feel safe at home. Summary  Adopting a healthy lifestyle and getting preventive care are important in promoting health and wellness.  Follow your health care provider's instructions about healthy diet, exercising, and getting tested or screened for diseases.  Follow your health care provider's instructions on monitoring your cholesterol and blood pressure. This information is not intended to replace advice given to you by your health care provider. Make sure you discuss any questions you have with your health care provider. Document Released: 08/08/2010 Document Revised: 01/16/2018 Document Reviewed: 01/16/2018 Elsevier Patient Education  2020 [employer].

## 2019-01-28 ENCOUNTER — Ambulatory Visit: Payer: BC Managed Care – PPO | Admitting: Cardiology

## 2019-01-28 ENCOUNTER — Encounter: Payer: Self-pay | Admitting: Cardiology

## 2019-01-28 ENCOUNTER — Other Ambulatory Visit: Payer: Self-pay

## 2019-01-28 VITALS — BP 138/81 | HR 81 | Temp 93.9°F | Ht 59.0 in | Wt 229.0 lb

## 2019-01-28 DIAGNOSIS — Z7189 Other specified counseling: Secondary | ICD-10-CM

## 2019-01-28 DIAGNOSIS — I10 Essential (primary) hypertension: Secondary | ICD-10-CM

## 2019-01-28 DIAGNOSIS — Z6841 Body Mass Index (BMI) 40.0 and over, adult: Secondary | ICD-10-CM

## 2019-01-28 NOTE — Patient Instructions (Signed)

## 2019-01-28 NOTE — Progress Notes (Signed)
Cardiology Office Note:    Date:  01/28/2019   ID:  Samantha Pearson, DOB 1960-10-27, MRN [id number]  PCP:  Samantha Pearson, Samantha Jose, MD  Cardiologist: Samantha RedBridgette Nelma Phagan, MD PhD  Referring MD: Samantha Pearson, Samantha Jose, *   CC: follow up  History of Present Illness:    Samantha Pearson is a 58 y.o. female with a hx of hypertension and obesity who is seen in follow up today.   She was initially seen as as a new consult on 08/16/17 for evaluation and management of elevated BNP and shortness of breath. Noted initial symptoms of orthopnea and increased swelling in 06/2017. It was shortly after she stopped chlorthalidone because of issues with uric acid and gout. She was started on spironolactone and an ARB with improvement. Gradually over time her symptoms have improved, but she still has some shortness of breath with exertion. Also endorsed palpitations and fatigue.   Echo done 08/2017 showed mild diastolic dysfunction. Declined sleep study, not covered for in lab but could do home sleep study, but had out of pocket cost.   Today: Doing well overall. 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  Has gained weight with the pandemic, had joined a gym just before the pandemic started. No significant intentional exercise since that time.   Has been checking BP at home, usually 130s/70s. Taking meds as prescribed.   Denies chest pain, shortness of breath at rest or with normal exertion. No PND, orthopnea, no change in intermittent LE edema (related to gout) or unexpected weight gain. No syncope or palpitations.  Past Medical History:  Diagnosis Date  . Allergy   . Anxiety   . Arthritis   . Chest pain 08/03/08   Urgent care visit  . Depression   . Dyspnea   . Edema   . Heart murmur   . Hypertension   . Obesity     Past Surgical History:  Procedure Laterality Date  . CESAREAN SECTION   04/16/1980  . DILATION AND CURETTAGE OF UTERUS     laparoscopic    Current Medications: Current Outpatient Medications on File Prior to Visit  Medication Sig  . febuxostat (ULORIC) 40 MG tablet Take 40 mg by mouth daily.  . metroNIDAZOLE (FLAGYL) 500 MG tablet Take 1 tablet (500 mg total) by mouth 2 (two) times daily. (Patient not taking: Reported on 01/21/2019)  . olmesartan (BENICAR) 40 MG tablet TAKE 1 TABLET BY MOUTH EVERY DAY  . spironolactone (ALDACTONE) 50 MG tablet TAKE 1 TABLET EVERY DAY (DO NOT TAKE POTASSIUM SUPPLEMENT WITH THIS MEDICATION)  . Turmeric POWD 1,950 mg by Does not apply route 3 (three) times daily.   No current facility-administered medications on file prior to visit.     Allergies:   Doxycycline, Norvasc [amlodipine besylate], Septra [bactrim], and Latex   Social History   Tobacco Use  . Smoking status: Never Smoker  . Smokeless tobacco: Never Used  . Tobacco comment: Nonsmoker  Substance Use Topics  . Alcohol use: No    Alcohol/week: 0.0 standard drinks  . Drug use: No   Family History: The patient's family history includes Breast cancer (age of onset: 5671) in her mother; Colon cancer in her maternal uncle; Colon polyps in her mother; Congestive Heart Failure in her father; Hyperlipidemia in her maternal grandmother and mother; Hypertension in her brother, maternal grandmother, mother,  sister, sister, sister, and sister; Prostate cancer in her father. There is no history of Esophageal cancer, Stomach cancer, or Rectal cancer.   ROS:   Please see the history of present illness.  Additional pertinent ROS: Constitutional: Negative for chills, fever, night sweats, unintentional weight loss  HENT: Negative for ear pain and hearing loss.   Eyes: Negative for loss of vision and eye pain.  Respiratory: Negative for cough, sputum, wheezing.   Cardiovascular: See HPI. Gastrointestinal: Negative for abdominal pain, melena, and hematochezia.  Genitourinary:  Negative for dysuria and hematuria.  Musculoskeletal: Negative for falls and myalgias.  Skin: Negative for itching and rash.  Neurological: Negative for focal weakness, focal sensory changes and loss of consciousness.  Endo/Heme/Allergies: Does not bruise/bleed easily.   EKGs/Labs/Other Studies Reviewed:    The following studies were personally reviewed today: Echo 08/30/17 - Left ventricle: The cavity size was normal. Wall thickness was   normal. Systolic function was normal. The estimated ejection   fraction was in the range of 50% to 55%. Wall motion was normal;   there were no regional wall motion abnormalities. Doppler   parameters are consistent with abnormal left ventricular   relaxation (grade 1 diastolic dysfunction).  Impressions: - Normal LV systolic function; mild diastolic dysfunction.  EKG:  EKG is personally reviewed today.  The ekg ordered today demonstrates normal sinus rhythm with nonspecific ST-T changes, slightly more pronounced than prior  Recent Labs: 08/06/2018: ALT 22; BUN 21; Creatinine, Ser 0.94; Hemoglobin 11.8; Platelets 245; Potassium 3.9; Sodium 138  Recent Lipid Panel    Component Value Date/Time   CHOL 193 01/14/2018 0859   TRIG 104 01/14/2018 0859   HDL 46 01/14/2018 0859   CHOLHDL 4.2 01/14/2018 0859   CHOLHDL 3.5 11/27/2015 1538   VLDL 43 (H) 11/27/2015 1538   LDLCALC 126 (H) 01/14/2018 0859    Physical Exam:    VS:  BP 138/81   Pulse 81   Temp (!) 93.9 F (34.4 C)   Ht 4\' 11"  (1.499 m)   Wt 229 lb (103.9 kg)   LMP 04/09/2014   SpO2 99%   BMI 46.25 kg/m     Wt Readings from Last 3 Encounters:  01/28/19 229 lb (103.9 kg)  01/21/19 229 lb 9.6 oz (104.1 kg)  09/16/18 223 lb 6.4 oz (101.3 kg)    GEN: Well nourished, well developed in no acute distress HEENT: Normal, moist mucous membranes NECK: No JVD CARDIAC: regular rhythm, normal S1 and S2, no rubs or gallops. No murmur. VASCULAR: Radial and DP pulses 2+ bilaterally. No  carotid bruits RESPIRATORY:  Clear to auscultation without rales, wheezing or rhonchi  ABDOMEN: Soft, non-tender, non-distended MUSCULOSKELETAL:  Ambulates independently SKIN: Warm and dry, no edema NEUROLOGIC:  Alert and oriented x 3. No focal neuro deficits noted. PSYCHIATRIC:  Normal affect   ASSESSMENT:    1. Essential hypertension   2. Class 3 severe obesity due to excess calories with serious comorbidity and body mass index (BMI) of 45.0 to 49.9 in adult [facility])   3. Cardiac risk counseling   4. Counseling on health promotion and disease prevention    PLAN:    Hypertension: slightly above goal today, counseled -encouraged diet and exercise, as below -continue spironolactone and olmesartan  Class 3 obesity counseled on lifestyle, diet, exercise today. She will work on this.  Prevention and health counseling: -recommend heart healthy/Mediterranean diet, with whole grains, fruits, vegetable, fish, lean meats, nuts, and olive oil. Limit salt. -recommend moderate walking,  3-5 times/week for 30-50 minutes each session. Aim for at least 150 minutes.week. Goal should be pace of 3 miles/hours, or walking 1.5 miles in 30 minutes -recommend avoidance of tobacco products. Avoid excess alcohol. -Additional risk factor control:  -Diabetes risk: A1c is 5.5  -Lipids: LDL 126, HDL 46, TG 104  -Blood pressure control: as above  -Weight: BMI 46. Class 3 severe obesity. Counseled on diet and exercise today, previously offered nutrition referral. -ASCVD risk score: The 10-year ASCVD risk score Mikey Bussing DC Brooke Bonito., et al., 2013) is: 4.3%   Values used to calculate the score:     Age: 75 years     Sex: Female     Is Non-Hispanic African American: No     Diabetic: No     Tobacco smoker: No     Systolic Blood Pressure: 878 mmHg     Is BP treated: Yes     HDL Cholesterol: 46 mg/dL     Total Cholesterol: 193 mg/dL   Follow up in 1 year or sooner PRN  Medication Adjustments/Labs and Tests  Ordered: Current medicines are reviewed at length with the patient today.  Concerns regarding medicines are outlined above.  Orders Placed This Encounter  Procedures  . EKG 12-Lead   No orders of the defined types were placed in this encounter.   Patient Instructions  Medication Instructions:  Your Physician recommend you continue on your current medication as directed.    *If you need a refill on your cardiac medications before your next appointment, please call your pharmacy*  Lab Work: None  Testing/Procedures: None  Follow-Up: At [facility], you and your health needs are our priority.  As part of our continuing mission to provide you with exceptional heart care, we have created designated Provider Care Teams.  These Care Teams include your primary Cardiologist (physician) and Advanced Practice Providers (APPs -  Physician Assistants and Nurse Practitioners) who all work together to provide you with the care you need, when you need it.  Your next appointment:   1 year(s)  The format for your next appointment:   In Person  Provider:   Buford Dresser, MD       Signed, Buford Dresser, MD PhD 01/28/2019 10:54 PM    [place]

## 2019-02-03 DIAGNOSIS — M109 Gout, unspecified: Secondary | ICD-10-CM | POA: Diagnosis not present

## 2019-02-03 DIAGNOSIS — M1990 Unspecified osteoarthritis, unspecified site: Secondary | ICD-10-CM | POA: Diagnosis not present

## 2019-02-03 DIAGNOSIS — M79643 Pain in unspecified hand: Secondary | ICD-10-CM | POA: Diagnosis not present

## 2019-02-03 DIAGNOSIS — I10 Essential (primary) hypertension: Secondary | ICD-10-CM | POA: Diagnosis not present

## 2019-02-04 ENCOUNTER — Ambulatory Visit: Payer: BC Managed Care – PPO | Admitting: Emergency Medicine

## 2019-02-04 DIAGNOSIS — N179 Acute kidney failure, unspecified: Secondary | ICD-10-CM | POA: Diagnosis not present

## 2019-02-04 DIAGNOSIS — M1A09X1 Idiopathic chronic gout, multiple sites, with tophus (tophi): Secondary | ICD-10-CM | POA: Diagnosis not present

## 2019-02-04 DIAGNOSIS — Z87448 Personal history of other diseases of urinary system: Secondary | ICD-10-CM | POA: Diagnosis not present

## 2019-02-04 DIAGNOSIS — R7303 Prediabetes: Secondary | ICD-10-CM | POA: Diagnosis not present

## 2019-02-04 DIAGNOSIS — I10 Essential (primary) hypertension: Secondary | ICD-10-CM | POA: Diagnosis not present

## 2019-03-11 DIAGNOSIS — Z20828 Contact with and (suspected) exposure to other viral communicable diseases: Secondary | ICD-10-CM | POA: Diagnosis not present

## 2019-03-11 DIAGNOSIS — Z03818 Encounter for observation for suspected exposure to other biological agents ruled out: Secondary | ICD-10-CM | POA: Diagnosis not present

## 2019-03-25 ENCOUNTER — Other Ambulatory Visit: Payer: Self-pay | Admitting: Emergency Medicine

## 2019-03-25 DIAGNOSIS — I10 Essential (primary) hypertension: Secondary | ICD-10-CM

## 2019-05-05 DIAGNOSIS — M25512 Pain in left shoulder: Secondary | ICD-10-CM | POA: Diagnosis not present

## 2019-05-05 DIAGNOSIS — M109 Gout, unspecified: Secondary | ICD-10-CM | POA: Diagnosis not present

## 2019-05-05 DIAGNOSIS — M79643 Pain in unspecified hand: Secondary | ICD-10-CM | POA: Diagnosis not present

## 2019-05-05 DIAGNOSIS — M19012 Primary osteoarthritis, left shoulder: Secondary | ICD-10-CM | POA: Diagnosis not present

## 2019-05-05 DIAGNOSIS — M1990 Unspecified osteoarthritis, unspecified site: Secondary | ICD-10-CM | POA: Diagnosis not present

## 2019-05-05 DIAGNOSIS — M19011 Primary osteoarthritis, right shoulder: Secondary | ICD-10-CM | POA: Diagnosis not present

## 2019-05-05 DIAGNOSIS — M25511 Pain in right shoulder: Secondary | ICD-10-CM | POA: Diagnosis not present

## 2019-05-05 DIAGNOSIS — I10 Essential (primary) hypertension: Secondary | ICD-10-CM | POA: Diagnosis not present

## 2019-07-28 ENCOUNTER — Encounter: Payer: Self-pay | Admitting: Emergency Medicine

## 2019-07-28 ENCOUNTER — Ambulatory Visit: Payer: BC Managed Care – PPO | Admitting: Emergency Medicine

## 2019-07-28 ENCOUNTER — Other Ambulatory Visit: Payer: Self-pay

## 2019-07-28 VITALS — BP 144/74 | HR 75 | Temp 97.7°F | Resp 16 | Ht 59.0 in | Wt 227.0 lb

## 2019-07-28 DIAGNOSIS — M1A09X0 Idiopathic chronic gout, multiple sites, without tophus (tophi): Secondary | ICD-10-CM

## 2019-07-28 DIAGNOSIS — R601 Generalized edema: Secondary | ICD-10-CM

## 2019-07-28 DIAGNOSIS — I10 Essential (primary) hypertension: Secondary | ICD-10-CM

## 2019-07-28 MED ORDER — SPIRONOLACTONE 50 MG PO TABS: 25.0000 mg | ORAL_TABLET | Freq: Every day | ORAL | 3 refills | Status: DC

## 2019-07-28 MED ORDER — OLMESARTAN MEDOXOMIL 40 MG PO TABS: 40.0000 mg | ORAL_TABLET | Freq: Every day | ORAL | 3 refills | Status: DC

## 2019-07-28 NOTE — Assessment & Plan Note (Signed)
Well-controlled hypertension.  Continue present medication.  No changes. 

## 2019-07-28 NOTE — Progress Notes (Signed)
Samantha Pearson 60 y.o.   Chief Complaint  Patient presents with  . Hypertension    follow up 6 month  . Medication Refill    Sprionolactone and Olmesartan    HISTORY OF PRESENT ILLNESS: This is a 59 y.o. female with history of hypertension here for 41-month follow-up and medication refill. Has no complaints or medical concerns today.  Blood pressure readings at home within normal limits. Renal function results reviewed with patient.  Abnormal 2 years ago but normal 1 year ago.  Recently saw kidney doctor and told to decrease dose of Aldactone.  Presently taking 25 mg daily.  Also taking olmesartan daily. No Covid vaccine or natural immunity. Problem list reviewed with patient. BP Readings from Last 3 Encounters:  07/28/19 (!) 150/81  01/28/19 138/81  01/21/19 (!) 168/79   Lab Results  Component Value Date   CREATININE 0.94 08/06/2018   BUN 21 08/06/2018   NA 138 08/06/2018   K 3.9 08/06/2018   CL 102 08/06/2018   CO2 18 (L) 08/06/2018   Wt Readings from Last 3 Encounters:  07/28/19 227 lb (103 kg)  01/28/19 229 lb (103.9 kg)  01/21/19 229 lb 9.6 oz (104.1 kg)   The 10-year ASCVD risk score Denman George DC Jr., et al., 2013) is: 5.5%   Values used to calculate the score:     Age: 2 years     Sex: Female     Is Non-Hispanic African American: No     Diabetic: No     Tobacco smoker: No     Systolic Blood Pressure: 150 mmHg     Is BP treated: Yes     HDL Cholesterol: 46 mg/dL     Total Cholesterol: 193 mg/dL   HPI   Prior to Admission medications   Medication Sig Start Date End Date Taking? Authorizing Provider  olmesartan (BENICAR) 40 MG tablet TAKE 1 TABLET BY MOUTH EVERY DAY 03/25/19  Yes Ronee Ranganathan, Eilleen Kempf, MD  spironolactone (ALDACTONE) 50 MG tablet TAKE 1 TABLET EVERY DAY (DO NOT TAKE POTASSIUM SUPPLEMENT WITH THIS MEDICATION) 08/17/18  Yes Nilo Fallin, Eilleen Kempf, MD  Turmeric POWD 1,950 mg by Does not apply route 3 (three) times daily.   Yes [provider]   febuxostat (ULORIC) 40 MG tablet Take 40 mg by mouth daily. Patient not taking: Reported on 07/28/2019    [provider]  metroNIDAZOLE (FLAGYL) 500 MG tablet Take 1 tablet (500 mg total) by mouth 2 (two) times daily. Patient not taking: Reported on 07/28/2019 09/19/18   Myles Lipps, MD    Allergies  Allergen Reactions  . Doxycycline Hives, Itching and Swelling  . Norvasc [Amlodipine Besylate] Swelling    Feet swelling  . Septra [Bactrim] Hives, Itching and Swelling  . Latex Swelling and Rash    Patient Active Problem List   Diagnosis Date Noted  . 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 Class 3 severe obesity due to excess calories with serious comorbidity and body mass index (BMI) of 40.0 to 44.9 in adult (HCC) 08/18/2013  . Essential hypertension 08/07/2008    Past Medical History:  Diagnosis Date  . Allergy   . Anxiety   . Arthritis   . Chest pain 08/03/08   Urgent care visit  . Depression   . Dyspnea   . Edema   . Heart murmur   . Hypertension   . Obesity     Past Surgical History:  Procedure Laterality Date  .  CESAREAN SECTION  04/16/1980  . DILATION AND CURETTAGE OF UTERUS     laparoscopic    Social History   Socioeconomic History  . Marital status: Single    Spouse name: Not on file  . Number of children: 1  . Years of education: Not on file  . Highest education level: Not on file  Occupational History  . Occupation: 3 jobs    Comment: International aid/development worker  Tobacco Use  . Smoking status: Never Smoker  . Smokeless tobacco: Never Used  . Tobacco comment: Nonsmoker  Substance and Sexual Activity  . Alcohol use: No    Alcohol/week: 0.0 standard drinks  . Drug use: No  . Sexual activity: Yes    Birth control/protection: None  Other Topics Concern  . 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 Difficulty of Paying Living Expenses:   Food Insecurity:   . Worried About Programme researcher, broadcasting/film/video in the Last Year:   . Barista in the Last Year:   Transportation Needs:   . Freight forwarder (Medical):   Marland Kitchen Lack of Transportation (Non-Medical):   Physical Activity:   . Days of Exercise per Week:   . Minutes of Exercise per Session:   Stress:   . Feeling of Stress :   Social Connections:   . Frequency of Communication with Friends and Family:   . Frequency of Social Gatherings with Friends and Family:   . Attends Religious Services:   . Active Member of Clubs or Organizations:   . Attends Banker Meetings:   Marland Kitchen Marital Status:   Intimate Partner Violence:   . Fear of Current or Ex-Partner:   . Emotionally Abused:   Marland Kitchen Physically Abused:   . Sexually Abused:     Family History  Problem Relation Age of Onset  . Prostate cancer Father   . Congestive Heart Failure Father   . Colon cancer Maternal Uncle   . Colon polyps Mother   . Breast cancer Mother 59  . Hyperlipidemia Mother   . Hypertension Mother   . Hypertension Sister   . Hypertension Brother   . Hypertension Sister   . Hypertension Sister   . Hypertension Sister   . Hyperlipidemia Maternal Grandmother   . Hypertension Maternal Grandmother   . Esophageal cancer Neg Hx   . Stomach cancer Neg Hx   . Rectal cancer Neg Hx      Review of Systems  Constitutional: Negative.  Negative for chills and fever.  HENT: Negative.  Negative for congestion and sore throat.   Respiratory: Negative.  Negative for cough and shortness of breath.   Cardiovascular: Negative.  Negative for  chest pain, palpitations and leg swelling.  Gastrointestinal: Negative.  Negative for abdominal pain, blood in stool, diarrhea, melena, nausea and vomiting.  Genitourinary: Negative.  Negative for dysuria and hematuria.  Musculoskeletal: Negative.  Negative  for myalgias and neck pain.  Skin: Negative.  Negative for rash.  Neurological: Negative.  Negative for dizziness and headaches.  All other systems reviewed and are negative.  Today's Vitals   07/28/19 0843  BP: (!) 150/81  Pulse: 75  Resp: 16  Temp: 97.7 F (36.5 C)  TempSrc: Temporal  SpO2: 98%  Weight: 227 lb (103 kg)  Height: 4\' 11"  (1.499 m)   Body mass index is 45.85 kg/m.   Physical Exam Vitals reviewed.  Constitutional:      Appearance: Normal appearance.  HENT:     Head: Normocephalic.  Eyes:     Extraocular Movements: Extraocular movements intact.     Conjunctiva/sclera: Conjunctivae normal.     Pupils: Pupils are equal, round, and reactive to light.  Cardiovascular:     Rate and Rhythm: Normal rate and regular rhythm.     Pulses: Normal pulses.     Heart sounds: Normal heart sounds.  Pulmonary:     Effort: Pulmonary effort is normal.     Breath sounds: Normal breath sounds.  Musculoskeletal:        General: Normal range of motion.     Cervical back: Normal range of motion and neck supple.  Skin:    General: Skin is warm and dry.     Capillary Refill: Capillary refill takes less than 2 seconds.  Neurological:     General: No focal deficit present.     Mental Status: She is alert and oriented to person, place, and time.  Psychiatric:        Mood and Affect: Mood normal.        Behavior: Behavior normal.    A total of 30 minutes was spent with the patient, greater than 50% of which was in counseling/coordination of care regarding hypertension and cardiovascular risks associated with this condition, review of most recent office visit notes, review of most recent blood work results, review of all  medications, diet and nutrition, prognosis and need for follow-up.   ASSESSMENT & PLAN: Essential hypertension Well-controlled hypertension.  Continue present medication.  No changes.  Deneise was seen today for hypertension and medication refill.  Diagnoses and all orders for this visit:  Essential hypertension -     Comprehensive metabolic panel -     Lipid panel -     Hemoglobin A1c -     CBC with Differential/Platelet -     olmesartan (BENICAR) 40 MG tablet; Take 1 tablet (40 mg total) by mouth daily. -     spironolactone (ALDACTONE) 50 MG tablet; Take 0.5 tablets (25 mg total) by mouth daily. TAKE 1 TABLET EVERY DAY (DO NOT TAKE POTASSIUM SUPPLEMENT WITH THIS MEDICATION)  Generalized edema  Idiopathic chronic gout of multiple sites without tophus    Patient Instructions       If you have lab work done today you will be contacted with your lab results within the next 2 weeks.  If you have not heard from Liborio Nixon then please contact us. The fastest way to get your results is to register for My Chart.   IF you received an x-ray today, you will receive an invoice from [facility]. Please contact [facility] at [phone] with questions or concerns regarding your invoice.   IF you received labwork today, you will receive an invoice from [place]. Please contact [facility] at [phone] with questions or concerns regarding your invoice.   Our billing staff will not be able to assist you with questions regarding  bills from these companies.  You will be contacted with the lab results as soon as they are available. The fastest way to get your results is to activate your My Chart account. Instructions are located on the last page of this paperwork. If you have not heard from us regarding the results in 2 weeks, please contact this office.        If you have lab work done today you will be contacted with your lab results within the next 2 weeks.  If you have not  heard from us then please contact us. The fastest way to get your results is to register for My Chart.   IF you received an x-ray today, you will receive an invoice from [facility]. Please contact [facility] at [phone] with questions or concerns regarding your invoice.   IF you received labwork today, you will receive an invoice from [facility]. Please contact [facility] at [phone] with questions or concerns regarding your invoice.   Our billing staff will not be able to assist you with questions regarding bills from these companies.  You will be contacted with the lab results as soon as they are available. The fastest way to get your results is to activate your My Chart account. Instructions are located on the last page of this paperwork. If you have not heard from us regarding the results in 2 weeks, please contact this office.     Hypertension, Adult High blood pressure (hypertension) is when the force of blood pumping through the arteries is too strong. The arteries are the blood vessels that carry blood from the heart throughout the body. Hypertension forces the heart to work harder to pump blood and may cause arteries to become narrow or stiff. Untreated or uncontrolled hypertension can cause a heart attack, heart failure, a stroke, kidney disease, and other problems. A blood pressure reading consists of a higher number over a lower number. Ideally, your blood pressure should be below 120/80. The first ("top") number is called the systolic pressure. It is a measure of the pressure in your arteries as your heart beats. The second ("bottom") number is called the diastolic pressure. It is a measure of the pressure in your arteries as the heart relaxes. What are the causes? The exact cause of this condition is not known. There are some conditions that result in or are related to high blood pressure. What increases the risk? Some risk factors for high blood pressure are  under your control. The following factors may make you more likely to develop this condition:  Smoking.  Having type 2 diabetes mellitus, high cholesterol, or both.  Not getting enough exercise or physical activity.  Being overweight.  Having too much fat, sugar, calories, or salt (sodium) in your diet.  Drinking too much alcohol. Some risk factors for high blood pressure may be difficult or impossible to change. Some of these factors include:  Having chronic kidney disease.  Having a family history of high blood pressure.  Age. Risk increases with age.  Race. You may be at higher risk if you are African American.  Gender. Men are at higher risk than women before age 58. After age 58, women are at higher risk than men.  Having obstructive sleep apnea.  Stress. What are the signs or symptoms? High blood pressure may not cause symptoms. Very high blood pressure (hypertensive crisis) may cause:  Headache.  Anxiety.  Shortness of breath.  Nosebleed.  Nausea and vomiting.  Vision changes.  Severe chest pain.  Seizures. How is this diagnosed? This condition is diagnosed by measuring your blood pressure while you are seated, with your arm resting on a flat surface, your legs uncrossed, and your feet flat on the floor. The cuff of the blood pressure monitor will be placed directly against the skin of your upper arm at the level of your heart. It should be measured at least twice using the same arm. Certain conditions can cause a difference in blood pressure between your right and left arms. Certain factors can cause blood pressure readings to be lower or higher than normal for a short period of time:  When your blood pressure is higher when you are in a health care provider's office than when you are at home, this is called white coat hypertension. Most people with this condition do not need medicines.  When your blood pressure is higher at home than when you are in a  health care provider's office, this is called masked hypertension. Most people with this condition may need medicines to control blood pressure. If you have a high blood pressure reading during one visit or you have normal blood pressure with other risk factors, you may be asked to:  Return on a different day to have your blood pressure checked again.  Monitor your blood pressure at home for 1 week or longer. If you are diagnosed with hypertension, you may have other blood or imaging tests to help your health care provider understand your overall risk for other conditions. How is this treated? This condition is treated by making healthy lifestyle changes, such as eating healthy foods, exercising more, and reducing your alcohol intake. Your health care provider may prescribe medicine if lifestyle changes are not enough to get your blood pressure under control, and if:  Your systolic blood pressure is above 130.  Your diastolic blood pressure is above 80. Your personal target blood pressure may vary depending on your medical conditions, your age, and other factors. Follow these instructions at home: Eating and drinking   Eat a diet that is high in fiber and potassium, and low in sodium, added sugar, and fat. An example eating plan is called the DASH (Dietary Approaches to Stop Hypertension) diet. To eat this way: ? Eat plenty of fresh fruits and vegetables. Try to fill one half of your plate at each meal with fruits and vegetables. ? Eat whole grains, such as whole-wheat pasta, brown rice, or whole-grain bread. Fill about one fourth of your plate with whole grains. ? Eat or drink low-fat dairy products, such as skim milk or low-fat yogurt. ? Avoid fatty cuts of meat, processed or cured meats, and poultry with skin. Fill about one fourth of your plate with lean proteins, such as fish, chicken without skin, beans, eggs, or tofu. ? Avoid pre-made and processed foods. These tend to be higher in  sodium, added sugar, and fat.  Reduce your daily sodium intake. Most people with hypertension should eat less than 1,500 mg of sodium a day.  Do not drink alcohol if: ? Your health care provider tells you not to drink. ? You are pregnant, may be pregnant, or are planning to become pregnant.  If you drink alcohol: ? Limit how much you use to:  0-1 drink a day for women.  0-2 drinks a day for men. ? Be aware of how much alcohol is in your drink. In the [place] one drink equals one 12 oz bottle of beer (355 mL),  one 5 oz glass of wine (148 mL), or one 1 oz glass of hard liquor (44 mL). Lifestyle   Work with your health care provider to maintain a healthy body weight or to lose weight. Ask what an ideal weight is for you.  Get at least 30 minutes of exercise most days of the week. Activities may include walking, swimming, or biking.  Include exercise to strengthen your muscles (resistance exercise), such as Pilates or lifting weights, as part of your weekly exercise routine. Try to do these types of exercises for 30 minutes at least 3 days a week.  Do not use any products that contain nicotine or tobacco, such as cigarettes, e-cigarettes, and chewing tobacco. If you need help quitting, ask your health care provider.  Monitor your blood pressure at home as told by your health care provider.  Keep all follow-up visits as told by your health care provider. This is important. Medicines  Take over-the-counter and prescription medicines only as told by your health care provider. Follow directions carefully. Blood pressure medicines must be taken as prescribed.  Do not skip doses of blood pressure medicine. Doing this puts you at risk for problems and can make the medicine less effective.  Ask your health care provider about side effects or reactions to medicines that you should watch for. Contact a health care provider if you:  Think you are having a reaction to a medicine you are  taking.  Have headaches that keep coming back (recurring).  Feel dizzy.  Have swelling in your ankles.  Have trouble with your vision. Get help right away if you:  Develop a severe headache or confusion.  Have unusual weakness or numbness.  Feel faint.  Have severe pain in your chest or abdomen.  Vomit repeatedly.  Have trouble breathing. Summary  Hypertension is when the force of blood pumping through your arteries is too strong. If this condition is not controlled, it may put you at risk for serious complications.  Your personal target blood pressure may vary depending on your medical conditions, your age, and other factors. For most people, a normal blood pressure is less than 120/80.  Hypertension is treated with lifestyle changes, medicines, or a combination of both. Lifestyle changes include losing weight, eating a healthy, low-sodium diet, exercising more, and limiting alcohol. This information is not intended to replace advice given to you by your health care provider. Make sure you discuss any questions you have with your health care provider. Document Revised: 10/03/2017 Document Reviewed: 10/03/2017 Elsevier Patient Education  2020 [employer]      Edwina Barth, MD [facility]

## 2019-07-28 NOTE — Patient Instructions (Addendum)
If you have lab work done today you will be contacted with your lab results within the next 2 weeks.  If you have not heard from [place] then please contact us. The fastest way to get your results is to register for My Chart.   IF you received an x-ray today, you will receive an invoice from [facility]. Please contact [facility] at [phone] with questions or concerns regarding your invoice.   IF you received labwork today, you will receive an invoice from [place]. Please contact [facility] at [phone] with questions or concerns regarding your invoice.   Our billing staff will not be able to assist you with questions regarding bills from these companies.  You will be contacted with the lab results as soon as they are available. The fastest way to get your results is to activate your My Chart account. Instructions are located on the last page of this paperwork. If you have not heard from [place] regarding the results in 2 weeks, please contact this office.        If you have lab work done today you will be contacted with your lab results within the next 2 weeks.  If you have not heard from [place] then please contact us. The fastest way to get your results is to register for My Chart.   IF you received an x-ray today, you will receive an invoice from [facility]. Please contact [facility] at [phone] with questions or concerns regarding your invoice.   IF you received labwork today, you will receive an invoice from [place]. Please contact [facility] at [phone] with questions or concerns regarding your invoice.   Our billing staff will not be able to assist you with questions regarding bills from these companies.  You will be contacted with the lab results as soon as they are available. The fastest way to get your results is to activate your My Chart account. Instructions are located on the last page of this paperwork. If you have not heard from [place]  regarding the results in 2 weeks, please contact this office.     Hypertension, Adult High blood pressure (hypertension) is when the force of blood pumping through the arteries is too strong. The arteries are the blood vessels that carry blood from the heart throughout the body. Hypertension forces the heart to work harder to pump blood and may cause arteries to become narrow or stiff. Untreated or uncontrolled hypertension can cause a heart attack, heart failure, a stroke, kidney disease, and other problems. A blood pressure reading consists of a higher number over a lower number. Ideally, your blood pressure should be below 120/80. The first ("top") number is called the systolic pressure. It is a measure of the pressure in your arteries as your heart beats. The second ("bottom") number is called the diastolic pressure. It is a measure of the pressure in your arteries as the heart relaxes. What are the causes? The exact cause of this condition is not known. There are some conditions that result in or are related to high blood pressure. What increases the risk? Some risk factors for high blood pressure are under your control. The following factors may make you more likely to develop this condition:  Smoking.  Having type 2 diabetes mellitus, high cholesterol, or both.  Not getting enough exercise or physical activity.  Being overweight.  Having too much fat, sugar, calories, or salt (sodium) in your diet.  Drinking too much alcohol. Some risk  factors for high blood pressure may be difficult or impossible to change. Some of these factors include:  Having chronic kidney disease.  Having a family history of high blood pressure.  Age. Risk increases with age.  Race. You may be at higher risk if you are African American.  Gender. Men are at higher risk than women before age 89. After age 61, women are at higher risk than men.  Having obstructive sleep apnea.  Stress. What are the  signs or symptoms? High blood pressure may not cause symptoms. Very high blood pressure (hypertensive crisis) may cause:  Headache.  Anxiety.  Shortness of breath.  Nosebleed.  Nausea and vomiting.  Vision changes.  Severe chest pain.  Seizures. How is this diagnosed? This condition is diagnosed by measuring your blood pressure while you are seated, with your arm resting on a flat surface, your legs uncrossed, and your feet flat on the floor. The cuff of the blood pressure monitor will be placed directly against the skin of your upper arm at the level of your heart. It should be measured at least twice using the same arm. Certain conditions can cause a difference in blood pressure between your right and left arms. Certain factors can cause blood pressure readings to be lower or higher than normal for a short period of time:  When your blood pressure is higher when you are in a health care provider's office than when you are at home, this is called white coat hypertension. Most people with this condition do not need medicines.  When your blood pressure is higher at home than when you are in a health care provider's office, this is called masked hypertension. Most people with this condition may need medicines to control blood pressure. If you have a high blood pressure reading during one visit or you have normal blood pressure with other risk factors, you may be asked to:  Return on a different day to have your blood pressure checked again.  Monitor your blood pressure at home for 1 week or longer. If you are diagnosed with hypertension, you may have other blood or imaging tests to help your health care provider understand your overall risk for other conditions. How is this treated? This condition is treated by making healthy lifestyle changes, such as eating healthy foods, exercising more, and reducing your alcohol intake. Your health care provider may prescribe medicine if lifestyle  changes are not enough to get your blood pressure under control, and if:  Your systolic blood pressure is above 130.  Your diastolic blood pressure is above 80. Your personal target blood pressure may vary depending on your medical conditions, your age, and other factors. Follow these instructions at home: Eating and drinking   Eat a diet that is high in fiber and potassium, and low in sodium, added sugar, and fat. An example eating plan is called the DASH (Dietary Approaches to Stop Hypertension) diet. To eat this way: ? Eat plenty of fresh fruits and vegetables. Try to fill one half of your plate at each meal with fruits and vegetables. ? Eat whole grains, such as whole-wheat pasta, brown rice, or whole-grain bread. Fill about one fourth of your plate with whole grains. ? Eat or drink low-fat dairy products, such as skim milk or low-fat yogurt. ? Avoid fatty cuts of meat, processed or cured meats, and poultry with skin. Fill about one fourth of your plate with lean proteins, such as fish, chicken without skin, beans, eggs, or  tofu. ? Avoid pre-made and processed foods. These tend to be higher in sodium, added sugar, and fat.  Reduce your daily sodium intake. Most people with hypertension should eat less than 1,500 mg of sodium a day.  Do not drink alcohol if: ? Your health care provider tells you not to drink. ? You are pregnant, may be pregnant, or are planning to become pregnant.  If you drink alcohol: ? Limit how much you use to:  0-1 drink a day for women.  0-2 drinks a day for men. ? Be aware of how much alcohol is in your drink. In the [place] one drink equals one 12 oz bottle of beer (355 mL), one 5 oz glass of wine (148 mL), or one 1 oz glass of hard liquor (44 mL). Lifestyle   Work with your health care provider to maintain a healthy body weight or to lose weight. Ask what an ideal weight is for you.  Get at least 30 minutes of exercise most days of the week. Activities  may include walking, swimming, or biking.  Include exercise to strengthen your muscles (resistance exercise), such as Pilates or lifting weights, as part of your weekly exercise routine. Try to do these types of exercises for 30 minutes at least 3 days a week.  Do not use any products that contain nicotine or tobacco, such as cigarettes, e-cigarettes, and chewing tobacco. If you need help quitting, ask your health care provider.  Monitor your blood pressure at home as told by your health care provider.  Keep all follow-up visits as told by your health care provider. This is important. Medicines  Take over-the-counter and prescription medicines only as told by your health care provider. Follow directions carefully. Blood pressure medicines must be taken as prescribed.  Do not skip doses of blood pressure medicine. Doing this puts you at risk for problems and can make the medicine less effective.  Ask your health care provider about side effects or reactions to medicines that you should watch for. Contact a health care provider if you:  Think you are having a reaction to a medicine you are taking.  Have headaches that keep coming back (recurring).  Feel dizzy.  Have swelling in your ankles.  Have trouble with your vision. Get help right away if you:  Develop a severe headache or confusion.  Have unusual weakness or numbness.  Feel faint.  Have severe pain in your chest or abdomen.  Vomit repeatedly.  Have trouble breathing. Summary  Hypertension is when the force of blood pumping through your arteries is too strong. If this condition is not controlled, it may put you at risk for serious complications.  Your personal target blood pressure may vary depending on your medical conditions, your age, and other factors. For most people, a normal blood pressure is less than 120/80.  Hypertension is treated with lifestyle changes, medicines, or a combination of both. Lifestyle  changes include losing weight, eating a healthy, low-sodium diet, exercising more, and limiting alcohol. This information is not intended to replace advice given to you by your health care provider. Make sure you discuss any questions you have with your health care provider. Document Revised: 10/03/2017 Document Reviewed: 10/03/2017 Elsevier Patient Education  2020 [employer].

## 2019-07-29 ENCOUNTER — Other Ambulatory Visit: Payer: Self-pay | Admitting: Emergency Medicine

## 2019-07-29 DIAGNOSIS — E785 Hyperlipidemia, unspecified: Secondary | ICD-10-CM

## 2019-07-29 LAB — COMPREHENSIVE METABOLIC PANEL
ALT: 30 IU/L (ref 0–32)
AST: 27 IU/L (ref 0–40)
Albumin/Globulin Ratio: 1.4 (ref 1.2–2.2)
Albumin: 4.4 g/dL (ref 3.8–4.9)
Alkaline Phosphatase: 105 IU/L (ref 48–121)
BUN/Creatinine Ratio: 19 (ref 9–23)
BUN: 16 mg/dL (ref 6–24)
Bilirubin Total: 0.6 mg/dL (ref 0.0–1.2)
CO2: 24 mmol/L (ref 20–29)
Calcium: 10 mg/dL (ref 8.7–10.2)
Chloride: 104 mmol/L (ref 96–106)
Creatinine, Ser: 0.83 mg/dL (ref 0.57–1.00)
GFR calc Af Amer: 90 mL/min/{1.73_m2} (ref >59)
GFR calc non Af Amer: 78 mL/min/{1.73_m2} (ref >59)
Globulin, Total: 3.1 g/dL (ref 1.5–4.5)
Glucose: 110 mg/dL — ABNORMAL HIGH (ref 65–99)
Potassium: 4.3 mmol/L (ref 3.5–5.2)
Sodium: 141 mmol/L (ref 134–144)
Total Protein: 7.5 g/dL (ref 6.0–8.5)

## 2019-07-29 LAB — CBC WITH DIFFERENTIAL/PLATELET
Basophils Absolute: 0 10*3/uL (ref 0.0–0.2)
Basos: 1 %
EOS (ABSOLUTE): 0.3 10*3/uL (ref 0.0–0.4)
Eos: 5 %
Hematocrit: 38 % (ref 34.0–46.6)
Hemoglobin: 12.5 g/dL (ref 11.1–15.9)
Immature Grans (Abs): 0 10*3/uL (ref 0.0–0.1)
Immature Granulocytes: 0 %
Lymphocytes Absolute: 2 10*3/uL (ref 0.7–3.1)
Lymphs: 36 %
MCH: 28 pg (ref 26.6–33.0)
MCHC: 32.9 g/dL (ref 31.5–35.7)
MCV: 85 fL (ref 79–97)
Monocytes Absolute: 0.4 10*3/uL (ref 0.1–0.9)
Monocytes: 8 %
Neutrophils Absolute: 2.8 10*3/uL (ref 1.4–7.0)
Neutrophils: 50 %
Platelets: 241 10*3/uL (ref 150–450)
RBC: 4.46 x10E6/uL (ref 3.77–5.28)
RDW: 12.2 % (ref 11.7–15.4)
WBC: 5.5 10*3/uL (ref 3.4–10.8)

## 2019-07-29 LAB — LIPID PANEL
Chol/HDL Ratio: 3.8 ratio (ref 0.0–4.4)
Cholesterol, Total: 203 mg/dL — ABNORMAL HIGH (ref 100–199)
HDL: 53 mg/dL (ref >39)
LDL Chol Calc (NIH): 126 mg/dL — ABNORMAL HIGH (ref 0–99)
Triglycerides: 134 mg/dL (ref 0–149)
VLDL Cholesterol Cal: 24 mg/dL (ref 5–40)

## 2019-07-29 LAB — HEMOGLOBIN A1C
Est. average glucose Bld gHb Est-mCnc: 111 mg/dL
Hgb A1c MFr Bld: 5.5 % (ref 4.8–5.6)

## 2019-07-29 MED ORDER — ROSUVASTATIN CALCIUM 10 MG PO TABS: 10.0000 mg | ORAL_TABLET | Freq: Every day | ORAL | 3 refills | Status: DC

## 2019-07-29 MED ORDER — SPIRONOLACTONE 25 MG PO TABS: 25.0000 mg | ORAL_TABLET | Freq: Every day | ORAL | 3 refills | Status: DC

## 2019-10-10 DIAGNOSIS — Z124 Encounter for screening for malignant neoplasm of cervix: Secondary | ICD-10-CM | POA: Diagnosis not present

## 2019-10-10 DIAGNOSIS — Z01419 Encounter for gynecological examination (general) (routine) without abnormal findings: Secondary | ICD-10-CM | POA: Diagnosis not present

## 2019-10-10 DIAGNOSIS — Z6841 Body Mass Index (BMI) 40.0 and over, adult: Secondary | ICD-10-CM | POA: Diagnosis not present

## 2019-12-01 DIAGNOSIS — M79643 Pain in unspecified hand: Secondary | ICD-10-CM | POA: Diagnosis not present

## 2019-12-01 DIAGNOSIS — M7072 Other bursitis of hip, left hip: Secondary | ICD-10-CM | POA: Diagnosis not present

## 2019-12-01 DIAGNOSIS — M109 Gout, unspecified: Secondary | ICD-10-CM | POA: Diagnosis not present

## 2019-12-01 DIAGNOSIS — M1990 Unspecified osteoarthritis, unspecified site: Secondary | ICD-10-CM | POA: Diagnosis not present

## 2019-12-19 DIAGNOSIS — Z1231 Encounter for screening mammogram for malignant neoplasm of breast: Secondary | ICD-10-CM | POA: Diagnosis not present

## 2020-01-14 ENCOUNTER — Encounter: Payer: Self-pay | Admitting: Cardiology

## 2020-01-14 ENCOUNTER — Other Ambulatory Visit: Payer: Self-pay

## 2020-01-14 ENCOUNTER — Ambulatory Visit (INDEPENDENT_AMBULATORY_CARE_PROVIDER_SITE_OTHER): Payer: BC Managed Care – PPO | Admitting: Cardiology

## 2020-01-14 VITALS — BP 142/72 | HR 77 | Temp 96.4°F | Ht 59.0 in | Wt 230.8 lb

## 2020-01-14 DIAGNOSIS — Z6841 Body Mass Index (BMI) 40.0 and over, adult: Secondary | ICD-10-CM

## 2020-01-14 DIAGNOSIS — I10 Essential (primary) hypertension: Secondary | ICD-10-CM

## 2020-01-14 DIAGNOSIS — Z7189 Other specified counseling: Secondary | ICD-10-CM

## 2020-01-14 NOTE — Patient Instructions (Signed)

## 2020-01-14 NOTE — Progress Notes (Signed)
Cardiology Office Note:    Date:  01/14/2020   ID:  Samantha Pearson, DOB 1960/02/26, MRN [id number]  PCP:  Samantha Quint, MD  Cardiologist: Samantha Red, MD PhD  Referring MD: Samantha Pearson, *   CC: follow up  History of Present Illness:    Samantha Pearson is a 59 y.o. female with a hx of hypertension and obesity who is seen in follow up today.   She was initially seen as as a new consult on 08/16/17 for evaluation and management of elevated BNP and shortness of breath. Noted initial symptoms of orthopnea and increased swelling in 06/2017. It was shortly after she stopped chlorthalidone because of issues with uric acid and gout. She was started on spironolactone and an ARB with improvement. Gradually over time her symptoms have improved, but she still has some shortness of breath with exertion. Also endorsed palpitations and fatigue.   Echo done 08/2017 showed mild diastolic dysfunction. Declined sleep study, not covered for in lab but could do home sleep study, but had out of pocket cost.   Today: Doing well overall. Just got second Covid vaccine, and since then has been feeling more anxious (had about 48 hours ago). Before the shot, was seeing 140s/70s. Hasn't taken BP at home since the shot. Recheck BP improved today but still over goal.   Tried chlorthalidone, caused severe gout. Has had foot swelling on amlodipine in the past, doesn't tolerate. We discussed adding a medication vs. Working on lifestyle. She would like to work on lifestyle.   Denies chest pain, shortness of breath at rest or with normal exertion. Shortness of breath only when wearing the mask. No PND, orthopnea, LE edema or unexpected weight gain. No syncope or palpitations.  Past Medical History:  Diagnosis Date  . Allergy   . Anxiety   . Arthritis   . Chest pain 08/03/08   Urgent care visit  . Depression   . Dyspnea   . Edema   . Heart murmur   . Hypertension   . Obesity     Past  Surgical History:  Procedure Laterality Date  . CESAREAN SECTION  04/16/1980  . DILATION AND CURETTAGE OF UTERUS     laparoscopic    Current Medications: Current Outpatient Medications on File Prior to Visit  Medication Sig  . olmesartan (BENICAR) 40 MG tablet Take 1 tablet (40 mg total) by mouth daily.  . Turmeric POWD 1,950 mg by Does not apply route 3 (three) times daily.  Marland Kitchen spironolactone (ALDACTONE) 25 MG tablet Take 1 tablet (25 mg total) by mouth daily.   No current facility-administered medications on file prior to visit.     Allergies:   Chlorthalidone, Doxycycline, Norvasc [amlodipine besylate], Septra [bactrim], and Latex   Social History   Tobacco Use  . Smoking status: Never Smoker  . Smokeless tobacco: Never Used  . Tobacco comment: Nonsmoker  Substance Use Topics  . Alcohol use: No    Alcohol/week: 0.0 standard drinks  . Drug use: No   Family History: The patient's family history includes Breast cancer (age of onset: 67) in her mother; Colon cancer in her maternal uncle; Colon polyps in her mother; Congestive Heart Failure in her father; Hyperlipidemia in her maternal grandmother and mother; Hypertension in her brother, maternal grandmother, mother, sister, sister, sister, and sister; Prostate cancer in her father. There is no history of Esophageal cancer, Stomach cancer, or Rectal cancer.   ROS:   Please see the history of  present illness.  Additional pertinent ROS otherwise unremarkable.  EKGs/Labs/Other Studies Reviewed:    The following studies were personally reviewed today: Echo 08/30/17 - Left ventricle: The cavity size was normal. Wall thickness was   normal. Systolic function was normal. The estimated ejection   fraction was in the range of 50% to 55%. Wall motion was normal;   there were no regional wall motion abnormalities. Doppler   parameters are consistent with abnormal left ventricular   relaxation (grade 1 diastolic  dysfunction).  Impressions: - Normal LV systolic function; mild diastolic dysfunction.  EKG:  EKG is personally reviewed today.  The ekg ordered today demonstrates normal sinus rhythm at 77 bpm  Recent Labs: 07/28/2019: ALT 30; BUN 16; Creatinine, Ser 0.83; Hemoglobin 12.5; Platelets 241; Potassium 4.3; Sodium 141  Recent Lipid Panel    Component Value Date/Time   CHOL 203 (H) 07/28/2019 0927   TRIG 134 07/28/2019 0927   HDL 53 07/28/2019 0927   CHOLHDL 3.8 07/28/2019 0927   CHOLHDL 3.5 11/27/2015 1538   VLDL 43 (H) 11/27/2015 1538   LDLCALC 126 (H) 07/28/2019 0927    Physical Exam:    VS:  BP (!) 142/72   Pulse 77   Temp (!) 96.4 F (35.8 C)   Ht 4\' 11"  (1.499 m)   Wt 230 lb 12.8 oz (104.7 kg)   LMP 04/09/2014   SpO2 98%   BMI 46.62 kg/m     Wt Readings from Last 3 Encounters:  01/14/20 230 lb 12.8 oz (104.7 kg)  07/28/19 227 lb (103 kg)  01/28/19 229 lb (103.9 kg)    GEN: Well nourished, well developed in no acute distress HEENT: Normal, moist mucous membranes NECK: No JVD CARDIAC: regular rhythm, normal S1 and S2, no rubs or gallops. No murmur. VASCULAR: Radial and DP pulses 2+ bilaterally. No carotid bruits RESPIRATORY:  Clear to auscultation without rales, wheezing or rhonchi  ABDOMEN: Soft, non-tender, non-distended MUSCULOSKELETAL:  Ambulates independently SKIN: Warm and dry, no edema NEUROLOGIC:  Alert and oriented x 3. No focal neuro deficits noted. PSYCHIATRIC:  Normal affect   ASSESSMENT:    1. Essential hypertension   2. Cardiac risk counseling   3. Counseling on health promotion and disease prevention   4. Class 3 severe obesity due to excess calories with serious comorbidity and body mass index (BMI) of 45.0 to 49.9 in adult [facility])    PLAN:    Hypertension: very elevated initially, but recheck improved. Both recheck and home numbers above goal of <130/80 -severe gout on chlorthalidone, swelling on amlodipine -we discussed adding a 3rd  medication vs. Intensive lifestyle. She will focus on diet and exercise for the next six months and we will reassess.  -continue spironolactone and olmesartan  Class 3 obesity counseled on lifestyle, diet, exercise today. She plans to focus on this to avoid additional BP medications  Prevention and health counseling: -recommend heart healthy/Mediterranean diet, with whole grains, fruits, vegetable, fish, lean meats, nuts, and olive oil. Limit salt. -recommend moderate walking, 3-5 times/week for 30-50 minutes each session. Aim for at least 150 minutes.week. Goal should be pace of 3 miles/hours, or walking 1.5 miles in 30 minutes -recommend avoidance of tobacco products. Avoid excess alcohol. -Additional risk factor control:  -Diabetes risk: A1c is 5.5 07/2019  -Lipids: from 07/28/19 Tchol 203, HDL 53, LDL 126, TG 134. No longer taking rosuvastatin  -Blood pressure control: as above  -Weight: BMI 46. Class 3 severe obesity. Counseled on diet and exercise today,  previously offered nutrition referral. -ASCVD risk score: The 10-year ASCVD risk score Denman George DC Montez Hageman., et al., 2013) is: 4.6%   Values used to calculate the score:     Age: 79 years     Sex: Female     Is Non-Hispanic African American: No     Diabetic: No     Tobacco smoker: No     Systolic Blood Pressure: 142 mmHg     Is BP treated: Yes     HDL Cholesterol: 53 mg/dL     Total Cholesterol: 203 mg/dL   Follow up in 6 mos or sooner PRN  Medication Adjustments/Labs and Tests Ordered: Current medicines are reviewed at length with the patient today.  Concerns regarding medicines are outlined above.  Orders Placed This Encounter  Procedures  . EKG 12-Lead   No orders of the defined types were placed in this encounter.   Patient Instructions  Medication Instructions:  Your Physician recommend you continue on your current medication as directed.    *If you need a refill on your cardiac medications before your next appointment,  please call your pharmacy*   Lab Work: None  Testing/Procedures: None   Follow-Up: At [facility], you and your health needs are our priority.  As part of our continuing mission to provide you with exceptional heart care, we have created designated Provider Care Teams.  These Care Teams include your primary Cardiologist (physician) and Advanced Practice Providers (APPs -  Physician Assistants and Nurse Practitioners) who all work together to provide you with the care you need, when you need it.  We recommend signing up for the patient portal called "MyChart".  Sign up information is provided on this After Visit Summary.  MyChart is used to connect with patients for Virtual Visits (Telemedicine).  Patients are able to view lab/test results, encounter notes, upcoming appointments, etc.  Non-urgent messages can be sent to your provider as well.   To learn more about what you can do with MyChart, go to [email or web address].    Your next appointment:   6 month(s)  The format for your next appointment:   In Person  Provider:   Jodelle Red, MD        Signed, Samantha Red, MD PhD 01/14/2020 4:51 PM    [facility]

## 2020-01-20 ENCOUNTER — Encounter: Payer: Self-pay | Admitting: Emergency Medicine

## 2020-01-20 ENCOUNTER — Ambulatory Visit (INDEPENDENT_AMBULATORY_CARE_PROVIDER_SITE_OTHER): Payer: BC Managed Care – PPO | Admitting: Emergency Medicine

## 2020-01-20 ENCOUNTER — Other Ambulatory Visit: Payer: Self-pay

## 2020-01-20 VITALS — BP 140/70 | HR 71 | Temp 97.7°F | Resp 16 | Ht 59.0 in | Wt 229.0 lb

## 2020-01-20 DIAGNOSIS — Z130 Encounter for screening for diseases of the blood and blood-forming organs and certain disorders involving the immune mechanism: Secondary | ICD-10-CM

## 2020-01-20 DIAGNOSIS — Z0001 Encounter for general adult medical examination with abnormal findings: Secondary | ICD-10-CM

## 2020-01-20 DIAGNOSIS — R7303 Prediabetes: Secondary | ICD-10-CM | POA: Diagnosis not present

## 2020-01-20 DIAGNOSIS — Z6841 Body Mass Index (BMI) 40.0 and over, adult: Secondary | ICD-10-CM

## 2020-01-20 DIAGNOSIS — Z13220 Encounter for screening for lipoid disorders: Secondary | ICD-10-CM

## 2020-01-20 DIAGNOSIS — Z8739 Personal history of other diseases of the musculoskeletal system and connective tissue: Secondary | ICD-10-CM

## 2020-01-20 DIAGNOSIS — I10 Essential (primary) hypertension: Secondary | ICD-10-CM | POA: Diagnosis not present

## 2020-01-20 DIAGNOSIS — Z0000 Encounter for general adult medical examination without abnormal findings: Secondary | ICD-10-CM

## 2020-01-20 DIAGNOSIS — E785 Hyperlipidemia, unspecified: Secondary | ICD-10-CM

## 2020-01-20 NOTE — Patient Instructions (Addendum)
   If you have lab work done today you will be contacted with your lab results within the next 2 weeks.  If you have not heard from us then please contact us. The fastest way to get your results is to register for My Chart.   IF you received an x-ray today, you will receive an invoice from [facility]. Please contact [facility] at [phone] with questions or concerns regarding your invoice.   IF you received labwork today, you will receive an invoice from [facility]. Please contact [facility] at [phone] with questions or concerns regarding your invoice.   Our billing staff will not be able to assist you with questions regarding bills from these companies.  You will be contacted with the lab results as soon as they are available. The fastest way to get your results is to activate your My Chart account. Instructions are located on the last page of this paperwork. If you have not heard from us regarding the results in 2 weeks, please contact this office.       Health Maintenance, Female Adopting a healthy lifestyle and getting preventive care are important in promoting health and wellness. Ask your health care provider about:  The right schedule for you to have regular tests and exams.  Things you can do on your own to prevent diseases and keep yourself healthy. What should I know about diet, weight, and exercise? Eat a healthy diet   Eat a diet that includes plenty of vegetables, fruits, low-fat dairy products, and lean protein.  Do not eat a lot of foods that are high in solid fats, added sugars, or sodium. Maintain a healthy weight Body mass index (BMI) is used to identify weight problems. It estimates body fat based on height and weight. Your health care provider can help determine your BMI and help you achieve or maintain a healthy weight. Get regular exercise Get regular exercise. This is one of the most important things you can do for your health. Most  adults should:  Exercise for at least 150 minutes each week. The exercise should increase your heart rate and make you sweat (moderate-intensity exercise).  Do strengthening exercises at least twice a week. This is in addition to the moderate-intensity exercise.  Spend less time sitting. Even light physical activity can be beneficial. Watch cholesterol and blood lipids Have your blood tested for lipids and cholesterol at 58 years of age, then have this test every 5 years. Have your cholesterol levels checked more often if:  Your lipid or cholesterol levels are high.  You are older than 58 years of age.  You are at high risk for heart disease. What should I know about cancer screening? Depending on your health history and family history, you may need to have cancer screening at various ages. This may include screening for:  Breast cancer.  Cervical cancer.  Colorectal cancer.  Skin cancer.  Lung cancer. What should I know about heart disease, diabetes, and high blood pressure? Blood pressure and heart disease  High blood pressure causes heart disease and increases the risk of stroke. This is more likely to develop in people who have high blood pressure readings, are of African descent, or are overweight.  Have your blood pressure checked: ? Every 3-5 years if you are 18-39 years of age. ? Every year if you are 40 years old or older. Diabetes Have regular diabetes screenings. This checks your fasting blood sugar level. Have the screening done:  Once   every three years after age 40 if you are at a normal weight and have a low risk for diabetes.  More often and at a younger age if you are overweight or have a high risk for diabetes. What should I know about preventing infection? Hepatitis B If you have a higher risk for hepatitis B, you should be screened for this virus. Talk with your health care provider to find out if you are at risk for hepatitis B infection. Hepatitis  C Testing is recommended for:  Everyone born from 1945 through 1965.  Anyone with known risk factors for hepatitis C. Sexually transmitted infections (STIs)  Get screened for STIs, including gonorrhea and chlamydia, if: ? You are sexually active and are younger than 58 years of age. ? You are older than 58 years of age and your health care provider tells you that you are at risk for this type of infection. ? Your sexual activity has changed since you were last screened, and you are at increased risk for chlamydia or gonorrhea. Ask your health care provider if you are at risk.  Ask your health care provider about whether you are at high risk for HIV. Your health care provider may recommend a prescription medicine to help prevent HIV infection. If you choose to take medicine to prevent HIV, you should first get tested for HIV. You should then be tested every 3 months for as long as you are taking the medicine. Pregnancy  If you are about to stop having your period (premenopausal) and you may become pregnant, seek counseling before you get pregnant.  Take 400 to 800 micrograms (mcg) of folic acid every day if you become pregnant.  Ask for birth control (contraception) if you want to prevent pregnancy. Osteoporosis and menopause Osteoporosis is a disease in which the bones lose minerals and strength with aging. This can result in bone fractures. If you are 65 years old or older, or if you are at risk for osteoporosis and fractures, ask your health care provider if you should:  Be screened for bone loss.  Take a calcium or vitamin D supplement to lower your risk of fractures.  Be given hormone replacement therapy (HRT) to treat symptoms of menopause. Follow these instructions at home: Lifestyle  Do not use any products that contain nicotine or tobacco, such as cigarettes, e-cigarettes, and chewing tobacco. If you need help quitting, ask your health care provider.  Do not use street  drugs.  Do not share needles.  Ask your health care provider for help if you need support or information about quitting drugs. Alcohol use  Do not drink alcohol if: ? Your health care provider tells you not to drink. ? You are pregnant, may be pregnant, or are planning to become pregnant.  If you drink alcohol: ? Limit how much you use to 0-1 drink a day. ? Limit intake if you are breastfeeding.  Be aware of how much alcohol is in your drink. In the [place] one drink equals one 12 oz bottle of beer (355 mL), one 5 oz glass of wine (148 mL), or one 1 oz glass of hard liquor (44 mL). General instructions  Schedule regular health, dental, and eye exams.  Stay current with your vaccines.  Tell your health care provider if: ? You often feel depressed. ? You have ever been abused or do not feel safe at home. Summary  Adopting a healthy lifestyle and getting preventive care are important in promoting health and   wellness.  Follow your health care provider's instructions about healthy diet, exercising, and getting tested or screened for diseases.  Follow your health care provider's instructions on monitoring your cholesterol and blood pressure. This information is not intended to replace advice given to you by your health care provider. Make sure you discuss any questions you have with your health care provider. Document Revised: 01/16/2018 Document Reviewed: 01/16/2018 Elsevier Patient Education  2020 Elsevier Inc.  

## 2020-01-20 NOTE — Progress Notes (Signed)
Samantha Pearson 59 y.o.   Chief Complaint  Patient presents with  . Annual Exam    HISTORY OF PRESENT ILLNESS: This is a 59 y.o. female here for her annual exam. History of hypertension on olmesartan 40 mg and Aldactone 25 mg daily. Blood pressure readings at home within normal limits. Fully vaccinated against Covid. Complaining about increasing weight. No other complaints or medical concerns today.  HPI   Prior to Admission medications   Medication Sig Start Date End Date Taking? Authorizing Provider  olmesartan (BENICAR) 40 MG tablet Take 1 tablet (40 mg total) by mouth daily. 07/28/19  Yes Shandrell Boda, Eilleen Kempf, MD  Turmeric POWD 1,950 mg by Does not apply route 3 (three) times daily.   Yes [provider]  spironolactone (ALDACTONE) 25 MG tablet Take 1 tablet (25 mg total) by mouth daily. 07/29/19 10/27/19  Georgina Quint, MD    Allergies  Allergen Reactions  . Chlorthalidone Other (See Comments)    Severe gout  . Doxycycline Hives, Itching and Swelling  . Norvasc [Amlodipine Besylate] Swelling    Feet swelling  . Septra [Bactrim] Hives, Itching and Swelling  . Latex Swelling and Rash    Patient Active Problem List   Diagnosis Date Noted  . 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 Class 3 severe obesity due to excess calories with serious comorbidity and body mass index (BMI) of 40.0 to 44.9 in adult (HCC) 08/18/2013  . Essential hypertension 08/07/2008    Past Medical History:  Diagnosis Date  . Allergy   . Anxiety   . Arthritis   . Chest pain 08/03/08   Urgent care visit  . Depression   . Dyspnea   . Edema   . Heart murmur   . Hypertension   . Obesity     Past Surgical History:  Procedure Laterality Date  . CESAREAN SECTION  04/16/1980  . DILATION AND CURETTAGE OF UTERUS     laparoscopic    Social History   Socioeconomic History  . Marital status: Single    Spouse name: Not on file  . Number of  children: 1  . Years of education: Not on file  . Highest education level: Not on file  Occupational History  . Occupation: 3 jobs    Comment: International aid/development worker  Tobacco Use  . Smoking status: Never Smoker  . Smokeless tobacco: Never Used  . Tobacco comment: Nonsmoker  Substance and Sexual Activity  . Alcohol use: No    Alcohol/week: 0.0 standard drinks  . Drug use: No  . Sexual activity: Yes    Birth control/protection: None  Other Topics Concern  . 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 Prostate cancer Father   . Congestive Heart Failure Father   . Colon cancer Maternal Uncle   . Colon polyps Mother   . Breast cancer Mother [id number]  . Hyperlipidemia Mother   . Hypertension Mother   . Hypertension Sister   . Hypertension Brother   . Hypertension Sister   . Hypertension Sister   . Hypertension Sister   . Hyperlipidemia Maternal Grandmother   . Hypertension Maternal Grandmother   . Esophageal cancer Neg Hx   . Stomach cancer Neg Hx   . Rectal cancer Neg Hx      Review of Systems  Constitutional: Negative.  Negative for chills and fever.  HENT: Negative.  Negative for congestion and sore throat.   Respiratory: Negative.  Negative for cough and shortness of breath.   Cardiovascular:  Negative.  Negative for chest pain and palpitations.  Gastrointestinal: Negative.  Negative for abdominal pain, diarrhea, nausea and vomiting.  Genitourinary: Negative.  Negative for dysuria and hematuria.  Musculoskeletal: Negative.  Negative for back pain, myalgias and neck pain.  Skin: Negative.  Negative for rash.  Neurological: Negative.  Negative for dizziness and headaches.     Today's Vitals   01/20/20 0827 01/20/20 0939  BP: (!) 163/73 140/70  Pulse: 71   Resp: 16   Temp: 97.7 F (36.5 C)   TempSrc: Temporal   SpO2: 99%   Weight: 229 lb (103.9 kg)   Height: 4\' 11"  (1.499 m)    Body mass index is 46.25 kg/m.   Physical Exam Vitals reviewed.  Constitutional:      Appearance: Normal appearance. She is obese.  HENT:     Head: Normocephalic.  Eyes:     Extraocular Movements: Extraocular movements intact.     Conjunctiva/sclera: Conjunctivae normal.     Pupils: Pupils are equal, round, and reactive to light.  Cardiovascular:     Rate and Rhythm: Normal rate and regular rhythm.     Pulses: Normal pulses.     Heart sounds: Normal heart sounds.  Pulmonary:     Effort: Pulmonary effort is normal.     Breath sounds: Normal breath sounds.  Abdominal:     General: Bowel sounds are normal. There is no distension.     Palpations: Abdomen is soft. There is no mass.     Tenderness: There is no abdominal tenderness.  Musculoskeletal:        General: Normal range of motion.     Cervical back: Normal range of motion and neck supple.  Skin:    General: Skin is warm and dry.     Capillary Refill: Capillary refill takes less than 2 seconds.  Neurological:     General: No focal deficit present.     Mental Status: She is alert and oriented to person, place, and time.  Psychiatric:        Mood and Affect: Mood normal.        Behavior: Behavior normal.      ASSESSMENT & PLAN:  Samantha Pearson was seen today for annual exam.  Diagnoses and all orders for this visit:  Routine  general medical examination at a health care facility -     Comprehensive metabolic panel  Dyslipidemia -     Lipid panel  Essential hypertension -     Comprehensive metabolic panel -     Lipid panel -     CBC  with Differential/Platelet  Prediabetes -     Hemoglobin A1c  Encounter for general adult medical examination with abnormal findings  Screening for deficiency anemia  Screening for lipoid disorders  Body mass index (BMI) of 40.1-44.9 in adult (HCC) -     Amb Ref to Medical Weight Management  Morbid obesity (HCC) -     Amb Ref to Medical Weight Management    Patient Instructions       If you have lab work done today you will be contacted with your lab results within the next 2 weeks.  If you have not heard from [place] then please contact us. The fastest way to get your results is to register for My Chart.   IF you received an x-ray today, you will receive an invoice from [facility]. Please contact [facility] at [phone] with questions or concerns regarding your invoice.   IF you received labwork today, you will receive an invoice from [facility]. Please contact [facility] at [phone] with questions or concerns regarding your invoice.   Our billing staff will not be able to assist you with questions regarding bills from these companies.  You will be contacted with the lab results as soon as they are available. The fastest way to get your results is to activate your My Chart account. Instructions are located on the last page of this paperwork. If you have not heard from [place] regarding the results in 2 weeks, please contact this office.      Health Maintenance, Female Adopting a healthy lifestyle and getting preventive care are important in promoting health and wellness. Ask your health care provider about:  The right schedule for you to have regular tests and exams.  Things you can do on your own to prevent diseases and keep yourself  healthy. What should I know about diet, weight, and exercise? Eat a healthy diet   Eat a diet that includes plenty of vegetables, fruits, low-fat dairy products, and lean protein.  Do not eat a lot of foods that are high in solid fats, added sugars, or sodium. Maintain a healthy weight Body mass index (BMI) is used to identify weight problems. It estimates body fat based on height and weight. Your health care provider can help determine your BMI and help you achieve or maintain a healthy weight. Get regular exercise Get regular exercise. This is one of the most important things you can do for your health. Most adults should:  Exercise for at least 150 minutes each week. The exercise should increase your heart rate and make you sweat (moderate-intensity exercise).  Do strengthening exercises at least twice a week. This is in addition to the moderate-intensity exercise.  Spend less time sitting. Even light physical activity can be beneficial. Watch cholesterol and blood lipids Have your blood tested for lipids and cholesterol at 59 years of age, then have this test every 5 years. Have your cholesterol levels checked more often if:  Your lipid or cholesterol levels are high.  You are older than 58 years of age.  You are at high risk for heart disease. What should I know about cancer screening? Depending on your health history and family history, you may need to have cancer screening at various ages. This may include screening for:  Breast cancer.  Cervical cancer.  Colorectal cancer.  Skin cancer.  Lung cancer. What should I know about heart disease, diabetes, and high blood pressure? Blood pressure and heart disease  High blood pressure causes heart  disease and increases the risk of stroke. This is more likely to develop in people who have high blood pressure readings, are of African descent, or are overweight.  Have your blood pressure checked: ? Every 3-5 years if you are  65-82 years of age. ? Every year if you are 65 years old or older. Diabetes Have regular diabetes screenings. This checks your fasting blood sugar level. Have the screening done:  Once every three years after age 68 if you are at a normal weight and have a low risk for diabetes.  More often and at a younger age if you are overweight or have a high risk for diabetes. What should I know about preventing infection? Hepatitis B If you have a higher risk for hepatitis B, you should be screened for this virus. Talk with your health care provider to find out if you are at risk for hepatitis B infection. Hepatitis C Testing is recommended for:  Everyone born from [birth year] through 1965.  Anyone with known risk factors for hepatitis C. Sexually transmitted infections (STIs)  Get screened for STIs, including gonorrhea and chlamydia, if: ? You are sexually active and are younger than 58 years of age. ? You are older than 58 years of age and your health care provider tells you that you are at risk for this type of infection. ? Your sexual activity has changed since you were last screened, and you are at increased risk for chlamydia or gonorrhea. Ask your health care provider if you are at risk.  Ask your health care provider about whether you are at high risk for HIV. Your health care provider may recommend a prescription medicine to help prevent HIV infection. If you choose to take medicine to prevent HIV, you should first get tested for HIV. You should then be tested every 3 months for as long as you are taking the medicine. Pregnancy  If you are about to stop having your period (premenopausal) and you may become pregnant, seek counseling before you get pregnant.  Take 400 to 800 micrograms (mcg) of folic acid every day if you become pregnant.  Ask for birth control (contraception) if you want to prevent pregnancy. Osteoporosis and menopause Osteoporosis is a disease in which the bones lose  minerals and strength with aging. This can result in bone fractures. If you are 44 years old or older, or if you are at risk for osteoporosis and fractures, ask your health care provider if you should:  Be screened for bone loss.  Take a calcium or vitamin D supplement to lower your risk of fractures.  Be given hormone replacement therapy (HRT) to treat symptoms of menopause. Follow these instructions at home: Lifestyle  Do not use any products that contain nicotine or tobacco, such as cigarettes, e-cigarettes, and chewing tobacco. If you need help quitting, ask your health care provider.  Do not use street drugs.  Do not share needles.  Ask your health care provider for help if you need support or information about quitting drugs. Alcohol use  Do not drink alcohol if: ? Your health care provider tells you not to drink. ? You are pregnant, may be pregnant, or are planning to become pregnant.  If you drink alcohol: ? Limit how much you use to 0-1 drink a day. ? Limit intake if you are breastfeeding.  Be aware of how much alcohol is in your drink. In the [place] one drink equals one 12 oz bottle of beer (355 mL), one  5 oz glass of wine (148 mL), or one 1 oz glass of hard liquor (44 mL). General instructions  Schedule regular health, dental, and eye exams.  Stay current with your vaccines.  Tell your health care provider if: ? You often feel depressed. ? You have ever been abused or do not feel safe at home. Summary  Adopting a healthy lifestyle and getting preventive care are important in promoting health and wellness.  Follow your health care provider's instructions about healthy diet, exercising, and getting tested or screened for diseases.  Follow your health care provider's instructions on monitoring your cholesterol and blood pressure. This information is not intended to replace advice given to you by your health care provider. Make sure you discuss any questions you  have with your health care provider. Document Revised: 01/16/2018 Document Reviewed: 01/16/2018 Elsevier Patient Education  2020 [employer]vier Inc.     Edwina Barth, MD [facility]

## 2020-01-21 LAB — CBC WITH DIFFERENTIAL/PLATELET
Basophils Absolute: 0 10*3/uL (ref 0.0–0.2)
Basos: 1 %
EOS (ABSOLUTE): 0.2 10*3/uL (ref 0.0–0.4)
Eos: 4 %
Hematocrit: 38.6 % (ref 34.0–46.6)
Hemoglobin: 12.4 g/dL (ref 11.1–15.9)
Immature Grans (Abs): 0 10*3/uL (ref 0.0–0.1)
Immature Granulocytes: 0 %
Lymphocytes Absolute: 2.1 10*3/uL (ref 0.7–3.1)
Lymphs: 37 %
MCH: 27.5 pg (ref 26.6–33.0)
MCHC: 32.1 g/dL (ref 31.5–35.7)
MCV: 86 fL (ref 79–97)
Monocytes Absolute: 0.4 10*3/uL (ref 0.1–0.9)
Monocytes: 7 %
Neutrophils Absolute: 2.9 10*3/uL (ref 1.4–7.0)
Neutrophils: 51 %
Platelets: 266 10*3/uL (ref 150–450)
RBC: 4.51 x10E6/uL (ref 3.77–5.28)
RDW: 13.1 % (ref 11.7–15.4)
WBC: 5.8 10*3/uL (ref 3.4–10.8)

## 2020-01-21 LAB — HEMOGLOBIN A1C
Est. average glucose Bld gHb Est-mCnc: 117 mg/dL
Hgb A1c MFr Bld: 5.7 % — ABNORMAL HIGH (ref 4.8–5.6)

## 2020-01-21 LAB — LIPID PANEL
Chol/HDL Ratio: 3.3 ratio (ref 0.0–4.4)
Cholesterol, Total: 192 mg/dL (ref 100–199)
HDL: 58 mg/dL (ref >39)
LDL Chol Calc (NIH): 108 mg/dL — ABNORMAL HIGH (ref 0–99)
Triglycerides: 150 mg/dL — ABNORMAL HIGH (ref 0–149)
VLDL Cholesterol Cal: 26 mg/dL (ref 5–40)

## 2020-01-21 LAB — COMPREHENSIVE METABOLIC PANEL
ALT: 27 IU/L (ref 0–32)
AST: 24 IU/L (ref 0–40)
Albumin/Globulin Ratio: 1.5 (ref 1.2–2.2)
Albumin: 4.5 g/dL (ref 3.8–4.9)
Alkaline Phosphatase: 104 IU/L (ref 44–121)
BUN/Creatinine Ratio: 18 (ref 9–23)
BUN: 14 mg/dL (ref 6–24)
Bilirubin Total: 0.8 mg/dL (ref 0.0–1.2)
CO2: 22 mmol/L (ref 20–29)
Calcium: 10.2 mg/dL (ref 8.7–10.2)
Chloride: 98 mmol/L (ref 96–106)
Creatinine, Ser: 0.79 mg/dL (ref 0.57–1.00)
GFR calc Af Amer: 95 mL/min/{1.73_m2} (ref >59)
GFR calc non Af Amer: 83 mL/min/{1.73_m2} (ref >59)
Globulin, Total: 3 g/dL (ref 1.5–4.5)
Glucose: 87 mg/dL (ref 65–99)
Potassium: 4.4 mmol/L (ref 3.5–5.2)
Sodium: 138 mmol/L (ref 134–144)
Total Protein: 7.5 g/dL (ref 6.0–8.5)

## 2020-02-03 ENCOUNTER — Other Ambulatory Visit: Payer: Self-pay

## 2020-02-03 ENCOUNTER — Ambulatory Visit: Payer: BC Managed Care – PPO | Admitting: Family Medicine

## 2020-02-03 ENCOUNTER — Encounter: Payer: Self-pay | Admitting: Family Medicine

## 2020-02-03 VITALS — BP 146/78 | HR 86 | Temp 97.9°F | Ht 59.0 in | Wt 233.0 lb

## 2020-02-03 DIAGNOSIS — M549 Dorsalgia, unspecified: Secondary | ICD-10-CM | POA: Diagnosis not present

## 2020-02-03 LAB — POCT URINALYSIS DIP (MANUAL ENTRY)
Bilirubin, UA: NEGATIVE
Blood, UA: NEGATIVE
Glucose, UA: NEGATIVE mg/dL
Ketones, POC UA: NEGATIVE mg/dL
Nitrite, UA: NEGATIVE
Protein Ur, POC: NEGATIVE mg/dL
Spec Grav, UA: 1.015 (ref 1.010–1.025)
Urobilinogen, UA: 0.2 E.U./dL
pH, UA: 6 (ref 5.0–8.0)

## 2020-02-03 MED ORDER — DICLOFENAC SODIUM 1 % EX GEL: 2.0000 g | Freq: Four times a day (QID) | CUTANEOUS | 3 refills | Status: AC

## 2020-02-03 MED ORDER — DICLOFENAC SODIUM 75 MG PO TBEC: 75.0000 mg | DELAYED_RELEASE_TABLET | Freq: Two times a day (BID) | ORAL | 1 refills | Status: DC | PRN

## 2020-02-03 NOTE — Progress Notes (Addendum)
12/28/20213:15 PM  Samantha Pearson 28-Feb-1960, 60 y.o., female [id number]  Chief Complaint  Patient presents with  . Back Pain    Along mid-back, left side for 3 to 4 weeks     HPI:   Patient is a 59 y.o. female with past medical history significant for gout, HTN who presents today for back pain.  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 She is off this week from work    Depression screen [facility] 2/9 01/20/2020 07/28/2019 01/21/2019  Decreased Interest 0 0 0  Down, Depressed, Hopeless 0 0 0  PHQ - 2 Score 0 0 0  Altered sleeping - - -  Tired, decreased energy - - -  Change in appetite - - -  Feeling bad or failure about yourself  - - -  Trouble concentrating - - -  Moving slowly or fidgety/restless - - -  Suicidal thoughts - - -  PHQ-9 Score - - -  Difficult doing work/chores - - -  Some recent data might be hidden    Fall Risk  02/03/2020 01/20/2020 07/28/2019 01/21/2019 09/16/2018  Falls in the past year? 0 0 0 0 0  Number falls in past yr: 0 - - - 0  Injury with Fall? 0 - - - 0  Follow up Falls evaluation completed Falls evaluation completed Falls evaluation completed Falls evaluation completed -     Allergies  Allergen Reactions  . Chlorthalidone Other (See Comments)    Severe gout  . Doxycycline Hives, Itching and Swelling  . Norvasc [Amlodipine Besylate] Swelling    Feet swelling  . Septra [Bactrim] Hives, Itching and  Swelling  . Latex Swelling and Rash    Prior to Admission medications   Medication Sig Start Date End Date Taking? Authorizing Provider  olmesartan (BENICAR) 40 MG tablet Take 1 tablet (40 mg total) by mouth daily. 07/28/19  Yes Sagardia, Eilleen Kempf, MD  spironolactone (ALDACTONE) 25 MG tablet Take 1 tablet (25 mg total) by mouth daily. 07/29/19 10/27/19 Yes Sagardia, Eilleen Kempf, MD  Turmeric POWD 1,950 mg by Does not apply route 3 (three) times daily.   Yes [provider]    Past Medical History:  Diagnosis Date  . Allergy   . Anxiety   . Arthritis   . Chest pain 08/03/08   Urgent care visit  . Depression   . Dyspnea   . Edema   . Heart murmur   . Hypertension   . Obesity     Past Surgical History:  Procedure Laterality Date  . CESAREAN SECTION  04/16/1980  . DILATION AND CURETTAGE OF UTERUS     laparoscopic    Social History   Tobacco Use  . Smoking status: Never Smoker  . Smokeless tobacco: Never Used  . Tobacco comment: Nonsmoker  Substance Use Topics  . Alcohol use: No    Alcohol/week: 0.0 standard drinks    Family History  Problem Relation Age of  Onset  . Prostate cancer Father   . Congestive Heart Failure Father   . Hypertension Father   . Colon cancer Maternal Uncle   . Colon polyps Mother   . Breast cancer Mother 53       Left  . Hyperlipidemia Mother   . Hypertension Mother   . Hypertension Sister   . Hypertension Brother   . Hypertension Sister   . Hypertension Sister   . Hypertension Sister   . Hyperlipidemia Maternal Grandmother   . Hypertension Maternal Grandmother   . Esophageal cancer Neg Hx   . Stomach cancer Neg Hx   . Rectal cancer Neg Hx     Review of Systems  Constitutional: Negative for chills, fever and malaise/fatigue.  Eyes: Negative for blurred vision and double vision.  Respiratory: Negative for cough, shortness of breath and wheezing.   Cardiovascular: Negative for chest pain, palpitations and leg swelling.   Gastrointestinal: Negative for abdominal pain, blood in stool, constipation, diarrhea, heartburn, nausea and vomiting.  Genitourinary: Positive for flank pain. Negative for dysuria, frequency, hematuria and urgency.  Musculoskeletal: Positive for back pain. Negative for joint pain.  Skin: Negative for rash.  Neurological: Positive for tingling. Negative for dizziness, sensory change, weakness and headaches.     OBJECTIVE:  Today's Vitals   02/03/20 1413  BP: (!) 146/78  Pulse: 86  Temp: 97.9 F (36.6 C)  SpO2: 98%  Weight: 233 lb (105.7 kg)  Height: 4\' 11"  (1.499 m)   Body mass index is 47.06 kg/m.   Physical Exam Constitutional:      General: She is not in acute distress.    Appearance: Normal appearance. She is not ill-appearing.  HENT:     Head: Normocephalic.  Cardiovascular:     Rate and Rhythm: Normal rate and regular rhythm.     Pulses: Normal pulses.     Heart sounds: Normal heart sounds. No murmur heard. No friction rub. No gallop.   Pulmonary:     Effort: Pulmonary effort is normal. No respiratory distress.     Breath sounds: Normal breath sounds. No stridor. No wheezing, rhonchi or rales.  Abdominal:     General: Bowel sounds are normal. There is no distension.     Palpations: Abdomen is soft.     Tenderness: There is no abdominal tenderness. There is left CVA tenderness. There is no right CVA tenderness, guarding or rebound.  Musculoskeletal:     Thoracic back: Normal.     Lumbar back: Normal.       Back:     Right lower leg: No edema.     Left lower leg: No edema.  Skin:    General: Skin is warm and dry.  Neurological:     Mental Status: She is alert and oriented to person, place, and time.  Psychiatric:        Mood and Affect: Mood normal.        Behavior: Behavior normal.     Results for orders placed or performed in visit on 02/03/20 (from the past 24 hour(s))  POCT urinalysis dipstick     Status: Abnormal   Collection Time: 02/03/20   2:45 PM  Result Value Ref Range   Color, UA yellow yellow   Clarity, UA clear clear   Glucose, UA negative negative mg/dL   Bilirubin, UA negative negative   Ketones, POC UA negative negative mg/dL   Spec Grav, UA 02/05/20 8.144 - 1.025   Blood, UA negative negative  pH, UA 6.0 5.0 - 8.0   Protein Ur, POC negative negative mg/dL   Urobilinogen, UA 0.2 0.2 or 1.0 E.U./dL   Nitrite, UA Negative Negative   Leukocytes, UA Small (1+) (A) Negative    No results found.   ASSESSMENT and PLAN  Problem List Items Addressed This Visit   None   Visit Diagnoses    Acute left-sided back pain, unspecified back location    -  Primary   Relevant Medications   diclofenac Sodium (VOLTAREN) 1 % GEL   diclofenac (VOLTAREN) 75 MG EC tablet   Other Relevant Orders   POCT urinalysis dipstick (Completed) positive small leukocytes   Ambulatory referral to Physical Therapy   Urine Culture (will follow up with results)  R/se/b of medications discussed RTC precautions provided Discussed conservative and nontraditional treatment options       Return if symptoms worsen or fail to improve, for next scheduled appt.    Macario Carls Stephanny Tsutsui, FNP-BC Primary Care at [facility] [address] [place], [place] [address] Ph.  [phone] Fax [phone]  I have reviewed and agree with above documentation. Edwina Barth, MD

## 2020-02-03 NOTE — Patient Instructions (Addendum)
Sciatica Rehab Ask your health care provider which exercises are safe for you. Do exercises exactly as told by your health care provider and adjust them as directed. It is normal to feel mild stretching, pulling, tightness, or discomfort as you do these exercises. Stop right away if you feel sudden pain or your pain gets worse. Do not begin these exercises until told by your health care provider. Stretching and range-of-motion exercises These exercises warm up your muscles and joints and improve the movement and flexibility of your hips and back. These exercises also help to relieve pain, numbness, and tingling. Sciatic nerve glide 1. Sit in a chair with your head facing down toward your chest. Place your hands behind your back. Let your shoulders slump forward. 2. Slowly straighten one of your legs while you tilt your head back as if you are looking toward the ceiling. Only straighten your leg as far as you can without making your symptoms worse. 3. Hold this position for __________ seconds. 4. Slowly return to the starting position. 5. Repeat with your other leg. Repeat __________ times. Complete this exercise __________ times a day. Knee to chest with hip adduction and internal rotation  1. Lie on your back on a firm surface with both legs straight. 2. Bend one of your knees and move it up toward your chest until you feel a gentle stretch in your lower back and buttock. Then, move your knee toward the shoulder that is on the opposite side from your leg. This is hip adduction and internal rotation. ? Hold your leg in this position by holding on to the front of your knee. 3. Hold this position for __________ seconds. 4. Slowly return to the starting position. 5. Repeat with your other leg. Repeat __________ times. Complete this exercise __________ times a day. Prone extension on elbows  1. Lie on your abdomen on a firm surface. A bed may be too soft for this exercise. 2. Prop yourself up  on your elbows. 3. Use your arms to help lift your chest up until you feel a gentle stretch in your abdomen and your lower back. ? This will place some of your body weight on your elbows. If this is uncomfortable, try stacking pillows under your chest. ? Your hips should stay down, against the surface that you are lying on. Keep your hip and back muscles relaxed. 4. Hold this position for __________ seconds. 5. Slowly relax your upper body and return to the starting position. Repeat __________ times. Complete this exercise __________ times a day. Strengthening exercises These exercises build strength and endurance in your back. Endurance is the ability to use your muscles for a long time, even after they get tired. Pelvic tilt This exercise strengthens the muscles that lie deep in the abdomen. 1. Lie on your back on a firm surface. Bend your knees and keep your feet flat on the floor. 2. Tense your abdominal muscles. Tip your pelvis up toward the ceiling and flatten your lower back into the floor. ? To help with this exercise, you may place a small towel under your lower back and try to push your back into the towel. 3. Hold this position for __________ seconds. 4. Let your muscles relax completely before you repeat this exercise. Repeat __________ times. Complete this exercise __________ times a day. Alternating arm and leg raises  1. Get on your hands and knees on a firm surface. If you are on a hard floor, you may want to  use padding, such as an exercise mat, to cushion your knees. 2. Line up your arms and legs. Your hands should be directly below your shoulders, and your knees should be directly below your hips. 3. Lift your left leg behind you. At the same time, raise your right arm and straighten it in front of you. ? Do not lift your leg higher than your hip. ? Do not lift your arm higher than your shoulder. ? Keep your abdominal and back muscles tight. ? Keep your hips facing the  ground. ? Do not arch your back. ? Keep your balance carefully, and do not hold your breath. 4. Hold this position for __________ seconds. 5. Slowly return to the starting position. 6. Repeat with your right leg and your left arm. Repeat __________ times. Complete this exercise __________ times a day. Posture and body mechanics Good posture and healthy body mechanics can help to relieve stress in your body's tissues and joints. Body mechanics refers to the movements and positions of your body while you do your daily activities. Posture is part of body mechanics. Good posture means:  Your spine is in its natural S-curve position (neutral).  Your shoulders are pulled back slightly.  Your head is not tipped forward. Follow these guidelines to improve your posture and body mechanics in your everyday activities. Standing   When standing, keep your spine neutral and your feet about hip width apart. Keep a slight bend in your knees. Your ears, shoulders, and hips should line up.  When you do a task in which you stand in one place for a long time, place one foot up on a stable object that is 2-4 inches (5-10 cm) high, such as a footstool. This helps keep your spine neutral. Sitting   When sitting, keep your spine neutral and keep your feet flat on the floor. Use a footrest, if necessary, and keep your thighs parallel to the floor. Avoid rounding your shoulders, and avoid tilting your head forward.  When working at a desk or a computer, keep your desk at a height where your hands are slightly lower than your elbows. Slide your chair under your desk so you are close enough to maintain good posture.  When working at a computer, place your monitor at a height where you are looking straight ahead and you do not have to tilt your head forward or downward to look at the screen. Resting  When lying down and resting, avoid positions that are most painful for you.  If you have pain with activities  such as sitting, bending, stooping, or squatting, lie in a position in which your body does not bend very much. For example, avoid curling up on your side with your arms and knees near your chest (fetal position).  If you have pain with activities such as standing for a long time or reaching with your arms, lie with your spine in a neutral position and bend your knees slightly. Try the following positions: ? Lying on your side with a pillow between your knees. ? Lying on your back with a pillow under your knees. Lifting   When lifting objects, keep your feet at least shoulder width apart and tighten your abdominal muscles.  Bend your knees and hips and keep your spine neutral. It is important to lift using the strength of your legs, not your back. Do not lock your knees straight out.  Always ask for help to lift heavy or awkward objects. This information is  not intended to replace advice given to you by your health care provider. Make sure you discuss any questions you have with your health care provider. Document Revised: 05/17/2018 Document Reviewed: 02/14/2018 Elsevier Patient Education  2020 [employer] Inc[employer]  Osteoarthritis  Osteoarthritis is a type of arthritis that affects tissue that covers the ends of bones in joints (cartilage). Cartilage acts as a cushion between the bones and helps them move smoothly. Osteoarthritis results when cartilage in the joints gets worn down. Osteoarthritis is sometimes called "wear and tear" arthritis. Osteoarthritis is the most common form of arthritis. It often occurs in older people. It is a condition that gets worse over time (a progressive condition). Joints that are most often affected by this condition are in:  Fingers.  Toes.  Hips.  Knees.  Spine, including neck and lower back. What are the causes? This condition is caused by age-related wearing down of cartilage that covers the ends of bones. What increases the risk? The following  factors may make you more likely to develop this condition:  Older age.  Being overweight or obese.  Overuse of joints, such as in athletes.  Past injury of a joint.  Past surgery on a joint.  Family history of osteoarthritis. What are the signs or symptoms? The main symptoms of this condition are pain, swelling, and stiffness in the joint. The joint may lose its shape over time. Small pieces of bone or cartilage may break off and float inside of the joint, which may cause more pain and damage to the joint. Small deposits of bone (osteophytes) may grow on the edges of the joint. Other symptoms may include:  A grating or scraping feeling inside the joint when you move it.  Popping or creaking sounds when you move. Symptoms may affect one or more joints. Osteoarthritis in a major joint, such as your knee or hip, can make it painful to walk or exercise. If you have osteoarthritis in your hands, you might not be able to grip items, twist your hand, or control small movements of your hands and fingers (fine motor skills). How is this diagnosed? This condition may be diagnosed based on:  Your medical history.  A physical exam.  Your symptoms.  X-rays of the affected joint(s).  Blood tests to rule out other types of arthritis. How is this treated? There is no cure for this condition, but treatment can help to control pain and improve joint function. Treatment plans may include:  A prescribed exercise program that allows for rest and joint relief. You may work with a physical therapist.  A weight control plan.  Pain relief techniques, such as: ? Applying heat and cold to the joint. ? Electric pulses delivered to nerve endings under the skin (transcutaneous electrical nerve stimulation, or TENS). ? Massage. ? Certain nutritional supplements.  NSAIDs or prescription medicines to help relieve pain.  Medicine to help relieve pain and inflammation (corticosteroids). This can be  given by mouth (orally) or as an injection.  Assistive devices, such as a brace, wrap, splint, specialized glove, or cane.  Surgery, such as: ? An osteotomy. This is done to reposition the bones and relieve pain or to remove loose pieces of bone and cartilage. ? Joint replacement surgery. You may need this surgery if you have very bad (advanced) osteoarthritis. Follow these instructions at home: Activity  Rest your affected joints as directed by your health care provider.  Do not drive or use heavy machinery while taking prescription pain medicine.  Exercise as directed. Your health care provider or physical therapist may recommend specific types of exercise, such as: ? Strengthening exercises. These are done to strengthen the muscles that support joints that are affected by arthritis. They can be performed with weights or with exercise bands to add resistance. ? Aerobic activities. These are exercises, such as brisk walking or water aerobics, that get your heart pumping. ? Range-of-motion activities. These keep your joints easy to move. ? Balance and agility exercises. Managing pain, stiffness, and swelling      If directed, apply heat to the affected area as often as told by your health care provider. Use the heat source that your health care provider recommends, such as a moist heat pack or a heating pad. ? If you have a removable assistive device, remove it as told by your health care provider. ? Place a towel between your skin and the heat source. If your health care provider tells you to keep the assistive device on while you apply heat, place a towel between the assistive device and the heat source. ? Leave the heat on for 20-30 minutes. ? Remove the heat if your skin turns bright red. This is especially important if you are unable to feel pain, heat, or cold. You may have a greater risk of getting burned.  If directed, put ice on the affected joint: ? If you have a removable  assistive device, remove it as told by your health care provider. ? Put ice in a plastic bag. ? Place a towel between your skin and the bag. If your health care provider tells you to keep the assistive device on during icing, place a towel between the assistive device and the bag. ? Leave the ice on for 20 minutes, 2-3 times a day. General instructions  Take over-the-counter and prescription medicines only as told by your health care provider.  Maintain a healthy weight. Follow instructions from your health care provider for weight control. These may include dietary restrictions.  Do not use any products that contain nicotine or tobacco, such as cigarettes and e-cigarettes. These can delay bone healing. If you need help quitting, ask your health care provider.  Use assistive devices as directed by your health care provider.  Keep all follow-up visits as told by your health care provider. This is important. Where to find more information  [employer] of Arthritis and Musculoskeletal and Skin Diseases: www.niams.http://www.myers.net/  [employer] on Aging: https://walker.com/  American College of Rheumatology: www.rheumatology.org Contact a health care provider if:  Your skin turns red.  You develop a rash.  You have pain that gets worse.  You have a fever along with joint or muscle aches. Get help right away if:  You lose a lot of weight.  You suddenly lose your appetite.  You have night sweats. Summary  Osteoarthritis is a type of arthritis that affects tissue covering the ends of bones in joints (cartilage).  This condition is caused by age-related wearing down of cartilage that covers the ends of bones.  The main symptom of this condition is pain, swelling, and stiffness in the joint.  There is no cure for this condition, but treatment can help to control pain and improve joint function. This information is not intended to replace advice given to you by your health care  provider. Make sure you discuss any questions you have with your health care provider. Document Revised: 01/05/2017 Document Reviewed: 09/27/2015 Elsevier Patient Education  2020 [employer].  If you have lab work done today you will be contacted with your lab results within the next 2 weeks.  If you have not heard from us then please contact us. The fastest way to get your results is to register for My Chart. ° ° °IF you received an x-ray today, you will receive an invoice from [facility]. Please contact [facility] at [phone] with questions or concerns regarding your invoice.  ° °IF you received labwork today, you will receive an invoice from [facility]. Please contact [facility] at [phone] with questions or concerns regarding your invoice.  ° °Our billing staff will not be able to assist you with questions regarding bills from these companies. ° °You will be contacted with the lab results as soon as they are available. The fastest way to get your results is to activate your My Chart account. Instructions are located on the last page of this paperwork. If you have not heard from us regarding the results in 2 weeks, please contact this office. °  ° ° ° °

## 2020-02-05 LAB — URINE CULTURE

## 2020-02-26 ENCOUNTER — Ambulatory Visit: Payer: BC Managed Care – PPO | Attending: Family Medicine

## 2020-02-26 ENCOUNTER — Other Ambulatory Visit: Payer: Self-pay

## 2020-02-26 DIAGNOSIS — M5442 Lumbago with sciatica, left side: Secondary | ICD-10-CM

## 2020-02-26 DIAGNOSIS — R293 Abnormal posture: Secondary | ICD-10-CM

## 2020-02-26 IMAGING — DX DG FINGER THUMB 2+V*R*
3 series · 3 of 3 positions shown · non-contrast
Comparison: None.

CLINICAL DATA: Redness, warmth, pain and swelling about the right
thumb.

EXAM:
RIGHT THUMB 2+V

[finger ap]
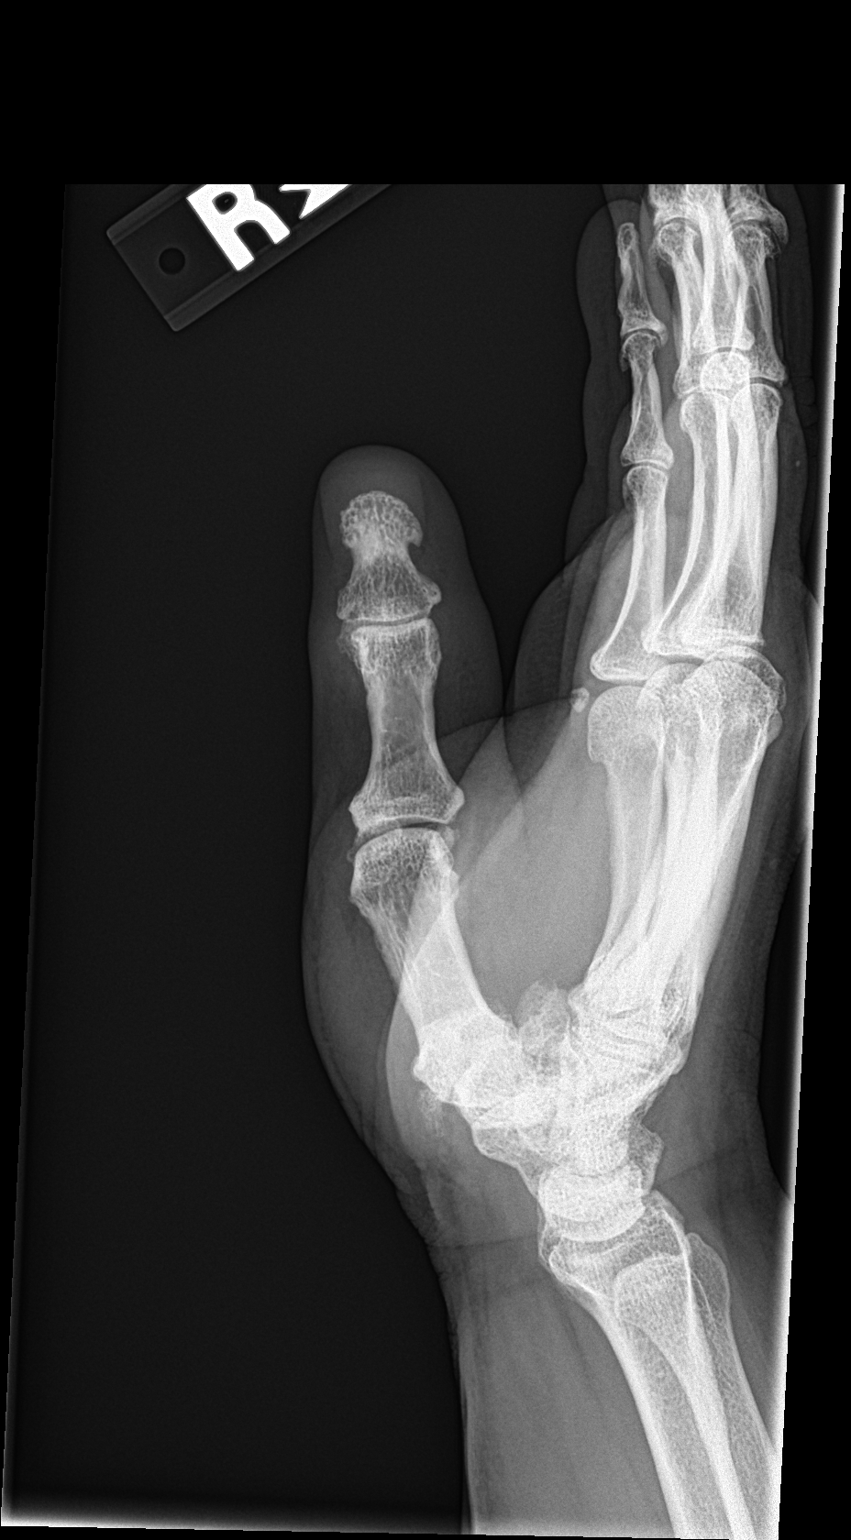

[finger obl]
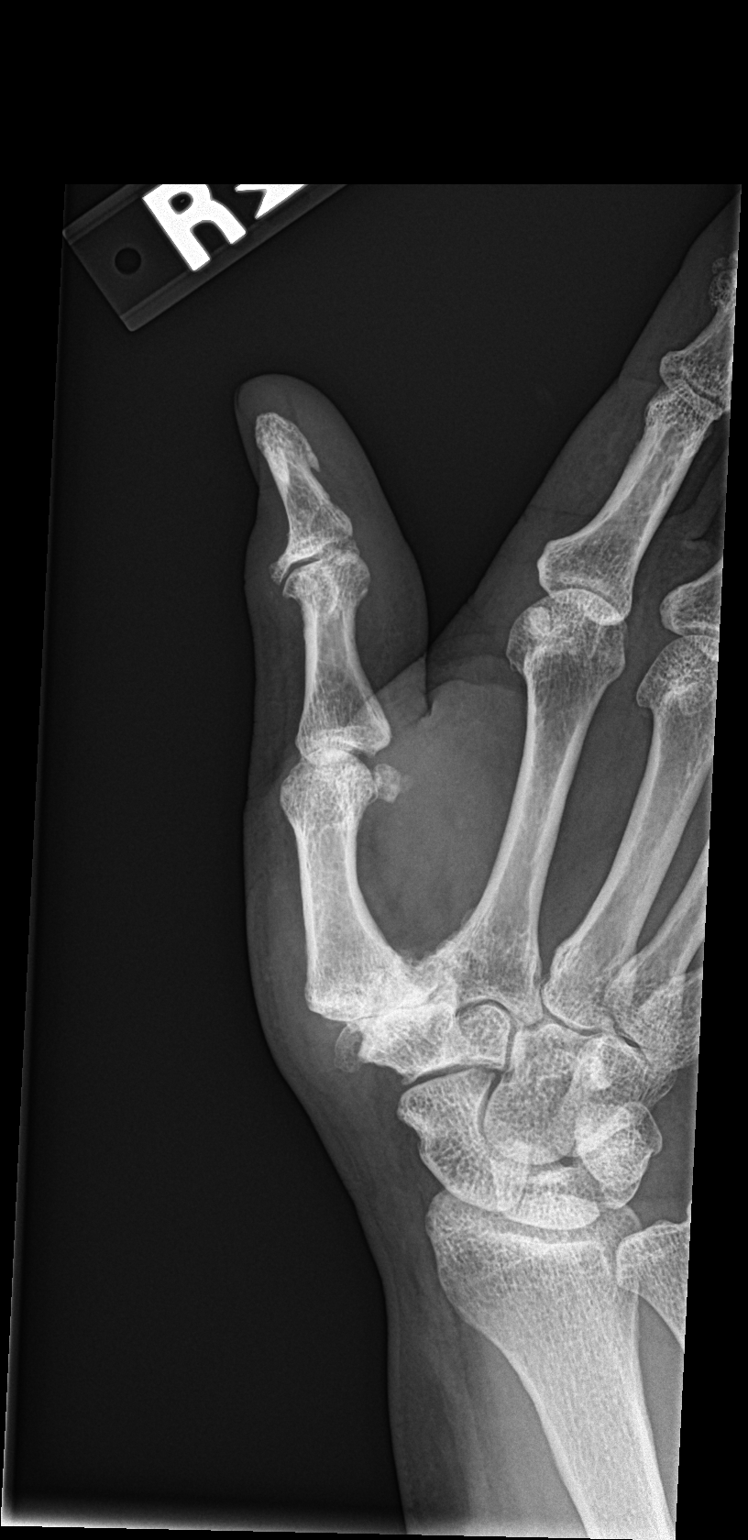

[finger lat]
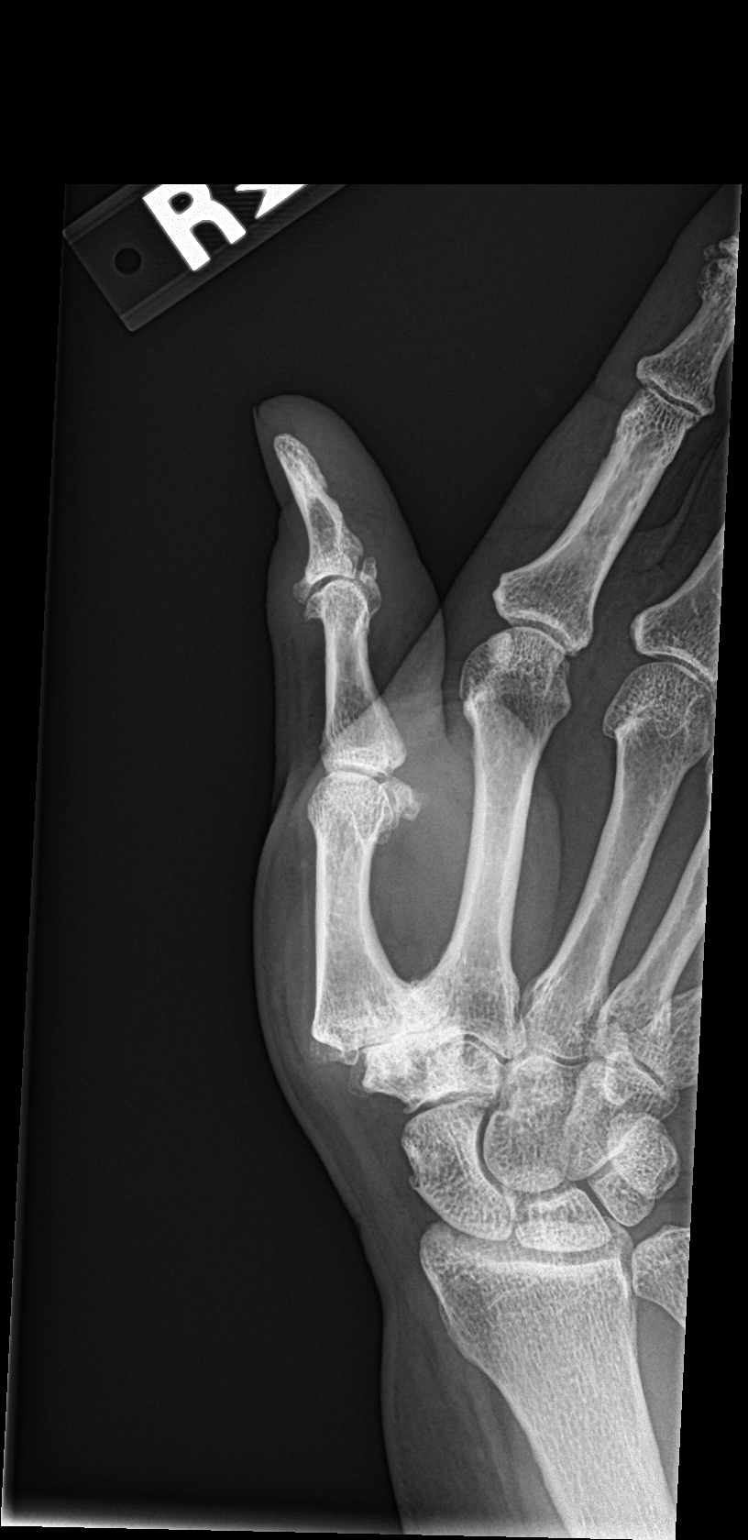

[3 of 3 positions shown; findings below may reference images not displayed]

FINDINGS: The patient has joint space narrowing and osteophytosis about both
the IP joint of the thumb and first CMC joint. Degenerative change
is advanced about both joints but worse at the first CMC. No erosion
is identified. Mineralization and alignment are normal. No fracture
or dislocation.
IMPRESSION: Negative for gout.

Osteoarthritis IP joint of the thumb and first CMC joint appears
more severe at the first CMC joint.

## 2020-02-27 NOTE — Therapy (Signed)
[facility] Outpatient Rehabilitation [facility] [address] [place], [place], [address] Phone: [phone]   Fax:  [phone]  Physical Therapy Evaluation  Patient Details  Name: Samantha Pearson MRN: [id number] Date of Birth: 01-Feb-1961 Referring Provider (PT): Just, Azalee Course, FNP   Encounter Date: 02/26/2020   PT End of Session - 02/27/20 1546    Visit Number 1    Number of Visits 7    Date for PT Re-Evaluation 03/24/20    Authorization Type BCBS COMM PPO    Authorization Time Period FOTO on the 6th and 10th visits    PT Start Time 1645    PT Stop Time 1735    PT Time Calculation (min) 50 min    Activity Tolerance Patient tolerated treatment well    Behavior During Therapy [facility] for tasks assessed/performed           Past Medical History:  Diagnosis Date   Allergy    Anxiety    Arthritis    Chest pain 08/03/08   Urgent care visit   Depression    Dyspnea    Edema    Heart murmur    Hypertension    Obesity     Past Surgical History:  Procedure Laterality Date   CESAREAN SECTION  04/16/1980   DILATION AND CURETTAGE OF UTERUS     laparoscopic    There were no vitals filed for this visit.    Subjective Assessment - 02/26/20 1625    Subjective Pt reports low back pain 2 months ago which usually has gone away in the past. It is continuing to flare up c muscle spasms with pain into the L LE extending to the knee or foot when it is bad. Needs a L TKR, Hx of a DDD and gout.    Diagnostic tests Lumbar X-rays from 2017    Patient Stated Goals To have less pain and manage it better.    Currently in Pain? Yes    Pain Score 3    10/10 at the wrose   Pain Location Back   hip   Pain Orientation Right;Posterior;Lower    Pain Descriptors / Indicators Aching;Sharp;Throbbing;Tightness    Pain Type Chronic pain    Pain Radiating Towards R hip and LE to knee or foot when at it's wrose    Aggravating Factors  Sleeping then moving    Pain Relieving  Factors Deep massge, stretching    Effect of Pain on Daily Activities Continuing to work              [facility] PT Assessment - 02/27/20 0001      Assessment   Medical Diagnosis Acute left-sided back pain, unspecified back location    Referring Provider (PT) Just, Azalee Course, FNP    Onset Date/Surgical Date --   Most recently for the past 2 months, off/on for years   Hand Dominance Left    Prior Therapy No      Precautions   Precautions None      Restrictions   Weight Bearing Restrictions No      Balance Screen   Has the patient fallen in the past 6 months No      Home Environment   Living Environment Private residence    Living Arrangements Parent    Type of Home Apartment    Home Access Stairs to enter    Entrance Stairs-Number of Steps 4    Entrance Stairs-Rails Right;Left    Home Layout Two level  Alternate Level Stairs-Number of Steps 10    Alternate Level Stairs-Rails Right      Prior Function   Level of Independence Independent    Vocation Full time employment    Actor, alot of walking      Cognition   Overall Cognitive Status Within Functional Limits for tasks assessed      Observation/Other Assessments   Focus on Therapeutic Outcomes (FOTO)  47% ability      Sensation   Light Touch Appears Intact      Posture/Postural Control   Posture/Postural Control Postural limitations    Postural Limitations Rounded Shoulders;Forward head;Increased lumbar lordosis   lordosis starting at approx T7     ROM / Strength   AROM / PROM / Strength AROM;Strength      AROM   AROM Assessment Site Lumbar    Lumbar Flexion Full FB c lordiosis not reversed    Lumbar Extension Full c discomfort at endrange    Lumbar - Right Side Bend Full    Lumbar - Left Side Bend Full c discofort at endrange    Lumbar - Right Rotation Full    Lumbar - Left Rotation Full      Strength   Overall Strength Comments LE myotomal screen is neg      Flexibility    Soft Tissue Assessment /Muscle Length yes    Hamstrings L 60d; R 60d      Palpation   SI assessment  ASIS and medial malleoli are level. Compression and distraction are both neg    Palpation comment TTP L upper gluteal area      Transfers   Transfers Sit to Stand;Stand to Sit    Sit to Stand 7: Independent      Ambulation/Gait   Ambulation/Gait Yes    Gait Pattern Within Functional Limits;Step-through pattern                      Objective measurements completed on examination: See above findings.               PT Education - 02/27/20 1545    Education Details Eval findings, POC, HEP    Person(s) Educated Patient    Methods Explanation;Demonstration;Tactile cues;Verbal cues;Handout;Other (comment)    Comprehension Verbalized understanding;Returned demonstration;Verbal cues required;Tactile cues required;Need further instruction            PT Short Term Goals - 02/27/20 1610      PT SHORT TERM GOAL #1   Title Pt will be Ind in a HEP    Status New    Target Date 03/26/20      PT SHORT TERM GOAL #2   Title Pt will voice understanding of measures to assist in the reduction of her low back and L LE pain.    Status New    Target Date 03/26/20             PT Long Term Goals - 02/27/20 1612      PT LONG TERM GOAL #1   Title Pt will be Ind in a final HEP to maintain or progress her achieved level of function    Status New    Target Date 04/03/20      PT LONG TERM GOAL #2   Title Pt will report an improved pain level with her daily and work related activities to 4/10 or less    Baseline 3-10/10    Target Date 04/03/20  PT LONG TERM GOAL #3   Title Pt will return demonstration of proper Optometrist for work and household actitivies    Status New    Target Date 04/03/20      PT LONG TERM GOAL #4   Title Pt's FOTO score will improved to the predicted value of 72%    Baseline 47%    Status New    Target Date 04/03/20                   Plan - 02/27/20 1553    Clinical Impression Statement Pt presents with a history of chronic low back pain which usually resolves, however with her lastest episode, which started 2 months ago, the pain is still present. Today, the pt demonstrated good ROM for her trunk with being able to bend forward and touch the floor, however the lordosis of lower thoracic and lumbar vertebral levels her did not reverse. Ther ex was started to address her lumbar flexibility and abdominal strengthening to reduce the compressive forces of her low back and promote lumbopelvic stability. PT will benefit from PT 1w6 for ROM, strengthening, modalities and manaul therapy to reduce pain and optimize pt's functional mobility.    Personal Factors and Comorbidities Comorbidity 2;Time since onset of injury/illness/exacerbation;Profession    Comorbidities Obsesity, Pt reports she needs a L TKR, and has a Hx of a DDD and gout    Examination-Activity Limitations Locomotion Level;Squat;Lift    Stability/Clinical Decision Making Stable/Uncomplicated    Clinical Decision Making Low    Rehab Potential Good    PT Frequency 1x / week    PT Duration 6 weeks    PT Treatment/Interventions ADLs/Self Care Home Management;Cryotherapy;Ship broker;Iontophoresis 4mg /ml Dexamethasone;Therapeutic activities;Therapeutic exercise;Manual techniques;Patient/family education;Passive range of motion;Dry needling;Taping    PT Next Visit Plan Assess response to HEP, review FOTO, progress ther ex as indicated, use modalities or manual therapy as indicated    PT Home Exercise Plan [id number]    Consulted and Agree with Plan of Care Patient           Patient will benefit from skilled therapeutic intervention in order to improve the following deficits and impairments:  Postural dysfunction,Decreased strength,Pain,Obesity,Difficulty walking  Visit Diagnosis: Acute left-sided low  back pain with left-sided sciatica  Abnormal posture     Problem List Patient Active Problem List   Diagnosis Date Noted   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   Class 3 severe obesity due to excess calories with serious comorbidity and body mass index (BMI) of 40.0 to 44.9 in adult [facility]) 08/18/2013   Essential hypertension 08/07/2008    10/08/2008 MS, PT 02/27/20 4:20 PM   [facility] Outpatient Rehabilitation [facility] [address] [place], [place], [place] Phone: [phone]   Fax:  [phone]  Name: Samantha Pearson MRN: [name] Date of Birth: 08-12-1960

## 2020-03-17 ENCOUNTER — Other Ambulatory Visit: Payer: Self-pay

## 2020-03-17 ENCOUNTER — Ambulatory Visit: Payer: BC Managed Care – PPO | Attending: Family Medicine | Admitting: Physical Therapy

## 2020-03-17 DIAGNOSIS — M5442 Lumbago with sciatica, left side: Secondary | ICD-10-CM | POA: Insufficient documentation

## 2020-03-17 DIAGNOSIS — R293 Abnormal posture: Secondary | ICD-10-CM | POA: Diagnosis not present

## 2020-03-18 ENCOUNTER — Encounter: Payer: Self-pay | Admitting: Physical Therapy

## 2020-03-18 NOTE — Therapy (Signed)
[facility] Outpatient Rehabilitation [facility] [address] [place], [place], [address] Phone: [phone]   Fax:  [phone]  Physical Therapy Treatment  Patient Details  Name: Samantha Pearson MRN: [id number] Date of Birth: 12-19-1960 Referring Provider (PT): Just, Azalee Course, FNP   Encounter Date: 03/17/2020   PT End of Session - 03/18/20 1614    Visit Number 2    Number of Visits 7    Date for PT Re-Evaluation 03/24/20    Authorization Type BCBS COMM PPO    Authorization Time Period FOTO on the 6th and 10th visits    PT Start Time 1545    PT Stop Time 1623    PT Time Calculation (min) 38 min    Activity Tolerance Patient tolerated treatment well    Behavior During Therapy [facility] for tasks assessed/performed           Past Medical History:  Diagnosis Date  . Allergy   . Anxiety   . Arthritis   . Chest pain 08/03/08   Urgent care visit  . Depression   . Dyspnea   . Edema   . Heart murmur   . Hypertension   . Obesity     Past Surgical History:  Procedure Laterality Date  . CESAREAN SECTION  04/16/1980  . DILATION AND CURETTAGE OF UTERUS     laparoscopic    There were no vitals filed for this visit.   Subjective Assessment - 03/18/20 1609    Subjective Patinet  reports she has been doing her stretches and feels like it is better. She is still feleing it when she had been standing for a long period of time.    Diagnostic tests Lumbar X-rays from 2017    Patient Stated Goals To have less pain and manage it better.    Currently in Pain? No/denies                             [facility] Adult PT Treatment/Exercise - 03/18/20 0001      Exercises   Exercises Lumbar      Lumbar Exercises: Stretches   Active Hamstring Stretch Limitations reviewed seated hamstring stretch for work. Reviewed how to use to improve knee extension    Piriformis Stretch Limitations reviewed seated for work 3x20 sec hold each leg    Other Lumbar Stretch  Exercise given tennis ball for slef trigger point release      Lumbar Exercises: Seated   Other Seated Lumbar Exercises bilateral ER 2x10 red      Lumbar Exercises: Supine   Pelvic Tilt Limitations 2x10    Clam Limitations 2x10    Bridge Limitations x10      Manual Therapy   Manual Therapy Soft tissue mobilization    Manual therapy comments unable to tolerate LAD 2nd to lack of knee extension.                  PT Education - 03/18/20 1612    Education Details reviewed HEp and symptom mangement    Person(s) Educated Patient    Methods Explanation;Demonstration;Tactile cues;Verbal cues    Comprehension Verbalized understanding;Returned demonstration;Verbal cues required;Tactile cues required            PT Short Term Goals - 02/27/20 1610      PT SHORT TERM GOAL #1   Title Pt will be Ind in a HEP    Status New    Target Date 03/26/20  PT SHORT TERM GOAL #2   Title Pt will voice understanding of measures to assist in the reduction of her low back and L LE pain.    Status New    Target Date 03/26/20             PT Long Term Goals - 02/27/20 1612      PT LONG TERM GOAL #1   Title Pt will be Ind in a final HEP to maintain or progress her achieved level of function    Status New    Target Date 04/03/20      PT LONG TERM GOAL #2   Title Pt will report an improved pain level with her daily and work related activities to 4/10 or less    Baseline 3-10/10    Target Date 04/03/20      PT LONG TERM GOAL #3   Title Pt will return demonstration of proper Optometrist for work and household actitivies    Status New    Target Date 04/03/20      PT LONG TERM GOAL #4   Title Pt's FOTO score will improved to the predicted value of 72%    Baseline 47%    Status New    Target Date 04/03/20                 Plan - 03/17/20 1549    Clinical Impression Statement Patient tolerated treatment well. She had decreased spasming compared to her intial eval. She  reports overall it is improving. Therapy reviewed self soift tissue mobilization. She alos reviewed her HEP. She was given updated HEP to give her more exercises for her core. she was given an upper extremity core strengthening exercise. She was advised on days her knee is hiurting she can do other things with her UE.    Personal Factors and Comorbidities Comorbidity 2;Time since onset of injury/illness/exacerbation;Profession    Comorbidities Obsesity, Pt reports she needs a L TKR, and has a Hx of a DDD and gout    Examination-Activity Limitations Locomotion Level;Squat;Lift    Stability/Clinical Decision Making Stable/Uncomplicated    Clinical Decision Making Low    Rehab Potential Good    PT Frequency 1x / week    PT Duration 6 weeks    PT Treatment/Interventions ADLs/Self Care Home Management;Cryotherapy;Ship broker;Iontophoresis 4mg /ml Dexamethasone;Therapeutic activities;Therapeutic exercise;Manual techniques;Patient/family education;Passive range of motion;Dry needling;Taping    PT Next Visit Plan Assess response to HEP, review FOTO, progress ther ex as indicated, use modalities or manual therapy as indicated    PT Home Exercise Plan [id number]    Consulted and Agree with Plan of Care Patient           Patient will benefit from skilled therapeutic intervention in order to improve the following deficits and impairments:  Postural dysfunction,Decreased strength,Pain,Obesity,Difficulty walking  Visit Diagnosis: Acute left-sided low back pain with left-sided sciatica  Abnormal posture     Problem List Patient Active Problem List   Diagnosis Date Noted  . 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 Class 3 severe obesity due to excess calories with serious comorbidity and body mass index (BMI) of 40.0 to 44.9 in adult (HCC) 08/18/2013  . Essential hypertension 08/07/2008    10/08/2008 PT DPT   03/18/2020, 4:19 PM  [facility] [address] [place], [place], [place] Phone: [phone]   Fax:  [phone]  Name: Samantha Pearson MRN: [name] Date of  Birth: Feb 28, 1960

## 2020-03-23 ENCOUNTER — Other Ambulatory Visit: Payer: Self-pay | Admitting: Family Medicine

## 2020-03-23 DIAGNOSIS — M549 Dorsalgia, unspecified: Secondary | ICD-10-CM

## 2020-03-23 NOTE — Telephone Encounter (Signed)
Requested Prescriptions  Pending Prescriptions Disp Refills  . diclofenac (VOLTAREN) 75 MG EC tablet [Pharmacy Med Name: DICLOFENAC SOD EC 75 MG TAB] 60 tablet 1    Sig: TAKE 1 TABLET BY MOUTH 2 TIMES DAILY AS NEEDED FOR MODERATE PAIN.     Analgesics:  NSAIDS Passed - 03/23/2020  1:17 AM      Passed - Cr in normal range and within 360 days    Creat  Date Value Ref Range Status  11/27/2015 0.86 0.50 - 1.05 mg/dL Final    Comment:      For patients > or = 59 years of age: The upper reference limit for Creatinine is approximately 13% higher for people identified as African-American.      Creatinine, Ser  Date Value Ref Range Status  01/20/2020 0.79 0.57 - 1.00 mg/dL Final         Passed - HGB in normal range and within 360 days    Hemoglobin  Date Value Ref Range Status  01/20/2020 12.4 11.1 - 15.9 g/dL Final         Passed - Patient is not pregnant      Passed - Valid encounter within last 12 months    Recent Outpatient Visits          1 month ago Acute left-sided back pain, unspecified back location   [facility] at [place] Just, Azalee Course, FNP   2 months ago Routine general medical examination at a health care facility   [facility] Care at [facility], Eilleen Kempf, MD   7 months ago Essential hypertension   Primary Care at [place], Eilleen Kempf, MD   1 year ago Routine general medical examination at a health care facility   Primary Care at [place], Eilleen Kempf, MD   1 year ago Vaginal discharge   Primary Care at [facility], Meda Coffee, MD      Future Appointments            In 3 months Sagardia, Eilleen Kempf, MD Primary Care at [place], [facility]

## 2020-03-24 ENCOUNTER — Other Ambulatory Visit: Payer: Self-pay

## 2020-03-24 ENCOUNTER — Ambulatory Visit: Payer: BC Managed Care – PPO

## 2020-03-24 DIAGNOSIS — M5442 Lumbago with sciatica, left side: Secondary | ICD-10-CM | POA: Diagnosis not present

## 2020-03-24 DIAGNOSIS — R293 Abnormal posture: Secondary | ICD-10-CM

## 2020-03-25 NOTE — Therapy (Signed)
[facility] Outpatient Rehabilitation [facility] [address] [place], [place], [address] Phone: [phone]   Fax:  [phone]  Physical Therapy Treatment  Patient Details  Name: RIDHIMA GOLBERG MRN: [id number] Date of Birth: 29-Mar-1960 Referring Provider (PT): Just, Azalee Course, FNP   Encounter Date: 03/24/2020   PT End of Session - 03/24/20 1539    Visit Number 3    Date for PT Re-Evaluation 03/24/20    Authorization Type BCBS COMM PPO    Authorization Time Period FOTO on the 6th and 10th visits    PT Start Time 1335    PT Stop Time 1418    PT Time Calculation (min) 43 min    Activity Tolerance Patient tolerated treatment well    Behavior During Therapy [facility] for tasks assessed/performed           Past Medical History:  Diagnosis Date  . Allergy   . Anxiety   . Arthritis   . Chest pain 08/03/08   Urgent care visit  . Depression   . Dyspnea   . Edema   . Heart murmur   . Hypertension   . Obesity     Past Surgical History:  Procedure Laterality Date  . CESAREAN SECTION  04/16/1980  . DILATION AND CURETTAGE OF UTERUS     laparoscopic    There were no vitals filed for this visit.   Subjective Assessment - 03/24/20 1536    Subjective Pt reports she has been doing good and has been completing her HEP. Pt is currently having no pain, but will experoience 4/10 depending on her activity, esp with the amount of walking she does at work. pt estimates her pain is approx 85% better..    Patient Stated Goals To have less pain and manage it better.    Currently in Pain? No/denies    Pain Score 0-No pain    Pain Location Back    Pain Orientation Right;Posterior    Pain Descriptors / Indicators Aching;Sharp;Throbbing;Tightness    Pain Type Chronic pain                             [facility] Adult PT Treatment/Exercise - 03/25/20 0001      Exercises   Exercises Lumbar      Lumbar Exercises: Stretches   Active Hamstring Stretch Right;Left;2  reps;20 seconds    Single Knee to Chest Stretch Right;Left;2 reps;10 seconds    Piriformis Stretch Right;Left;2 reps;10 seconds    Piriformis Stretch Limitations strap    Other Lumbar Stretch Exercise Seated forward flexion as tolerated 3x, 10 sec      Lumbar Exercises: Standing   Row Both;15 reps    Theraband Level (Row) Level 3 (Green)    Shoulder Extension Both;15 reps    Theraband Level (Shoulder Extension) Level 3 (Green)      Lumbar Exercises: Supine   Pelvic Tilt Limitations x10    Clam Limitations 2x10    Bridge Limitations x10                    PT Short Term Goals - 02/27/20 1610      PT SHORT TERM GOAL #1   Title Pt will be Ind in a HEP    Status New    Target Date 03/26/20      PT SHORT TERM GOAL #2   Title Pt will voice understanding of measures to assist in the reduction of her  low back and L LE pain.    Status New    Target Date 03/26/20             PT Long Term Goals - 02/27/20 1612      PT LONG TERM GOAL #1   Title Pt will be Ind in a final HEP to maintain or progress her achieved level of function    Status New    Target Date 04/03/20      PT LONG TERM GOAL #2   Title Pt will report an improved pain level with her daily and work related activities to 4/10 or less    Baseline 3-10/10    Target Date 04/03/20      PT LONG TERM GOAL #3   Title Pt will return demonstration of proper Optometrist for work and household actitivies    Status New    Target Date 04/03/20      PT LONG TERM GOAL #4   Title Pt's FOTO score will improved to the predicted value of 72%    Baseline 47%    Status New    Target Date 04/03/20                 Plan - 03/25/20 0744    Clinical Impression Statement Pt's subjective report indicates good improvement in her low back pain. PT was completed for lumbopelvic mobility and strengthening with emphais on core strengthening, Pt completed ther ex/HEP properly and tolerated the session with out adverse  effects.    Personal Factors and Comorbidities Comorbidity 2;Time since onset of injury/illness/exacerbation;Profession    Comorbidities Obsesity, Pt reports she needs a L TKR, and has a Hx of a DDD and gout    Examination-Activity Limitations Locomotion Level;Squat;Lift    Stability/Clinical Decision Making Stable/Uncomplicated    Clinical Decision Making Low    Rehab Potential Good    PT Frequency 1x / week    PT Duration 6 weeks    PT Treatment/Interventions ADLs/Self Care Home Management;Cryotherapy;Ship broker;Iontophoresis 4mg /ml Dexamethasone;Therapeutic activities;Therapeutic exercise;Manual techniques;Patient/family education;Passive range of motion;Dry needling;Taping    PT Next Visit Plan review FOTO, progress ther ex as indicated, use modalities or manual therapy as indicated    PT Home Exercise Plan [id number]    Consulted and Agree with Plan of Care Patient           Patient will benefit from skilled therapeutic intervention in order to improve the following deficits and impairments:  Postural dysfunction,Decreased strength,Pain,Obesity,Difficulty walking  Visit Diagnosis: Acute left-sided low back pain with left-sided sciatica  Abnormal posture     Problem List Patient Active Problem List   Diagnosis Date Noted  . 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 Class 3 severe obesity due to excess calories with serious comorbidity and body mass index (BMI) of 40.0 to 44.9 in adult (HCC) 08/18/2013  . Essential hypertension 08/07/2008    10/08/2008 MS, PT 03/25/20 7:55 AM  [facility] [address] [place], [place], [place] Phone: [phone]   Fax:  [phone]  Name: ETIENNE MILLWARD MRN: [name] Date of Birth: 07-16-1960

## 2020-03-27 ENCOUNTER — Ambulatory Visit: Payer: BC Managed Care – PPO

## 2020-03-27 ENCOUNTER — Other Ambulatory Visit: Payer: Self-pay

## 2020-03-27 DIAGNOSIS — R293 Abnormal posture: Secondary | ICD-10-CM | POA: Diagnosis not present

## 2020-03-27 DIAGNOSIS — M5442 Lumbago with sciatica, left side: Secondary | ICD-10-CM | POA: Diagnosis not present

## 2020-03-27 NOTE — Therapy (Signed)
[facility]patient [facility] [address] [place], [place], [address] Phone: [phone]   Fax:  [phone]  Physical Therapy Treatment/Re-Certification  Patient Details  Name: Samantha Pearson MRN: [id number] Date of Birth: 1960/08/02 Referring Provider (PT): Just, Azalee Course, FNP   Encounter Date: 03/27/2020   PT End of Session - 03/27/20 0821    Visit Number 4    Number of Visits 7    Date for PT Re-Evaluation 04/24/20    Authorization Type BCBS COMM PPO    Authorization Time Period FOTO on the 6th and 10th visits    PT Start Time 0815    PT Stop Time 0905    PT Time Calculation (min) 50 min    Activity Tolerance Patient tolerated treatment well    Behavior During Therapy [facility] for tasks assessed/performed           Past Medical History:  Diagnosis Date  . Allergy   . Anxiety   . Arthritis   . Chest pain 08/03/08   Urgent care visit  . Depression   . Dyspnea   . Edema   . Heart murmur   . Hypertension   . Obesity     Past Surgical History:  Procedure Laterality Date  . CESAREAN SECTION  04/16/1980  . DILATION AND CURETTAGE OF UTERUS     laparoscopic    There were no vitals filed for this visit.   Subjective Assessment - 03/27/20 0825    Subjective Pt reports being anxious yesterday and her back bothers her more when she is stressed. Pt states she did some stretches and the tennis ball massage and her low back felt better.    Diagnostic tests Lumbar X-rays from 2017    Patient Stated Goals To have less pain and manage it better.    Currently in Pain? No/denies    Pain Score 0-No pain    Pain Location Back    Pain Orientation Posterior;Left    Pain Descriptors / Indicators Aching;Throbbing;Tightness;Sharp    Pain Type Chronic pain    Pain Radiating Towards infrequent L hip/gluteal    Aggravating Factors  Sleeping then moving    Pain Relieving Factors Deep massge, stretching    Effect of Pain on Daily Activities Continues  to work                             [facility] Adult PT Treatment/Exercise - 03/27/20 0001      Exercises   Exercises Lumbar      Lumbar Exercises: Stretches   Active Hamstring Stretch Right;Left;2 reps;20 seconds    Single Knee to Chest Stretch Right;Left;2 reps;10 seconds    Lower Trunk Rotation 5 reps   5 sec   Piriformis Stretch Right;Left;2 reps;10 seconds    Piriformis Stretch Limitations Seated reach across    Other Lumbar Stretch Exercise Seated forward flexion as tolerated 3x, 10 sec      Lumbar Exercises: Aerobic   Nustep L5; 8 mins; UE/LE      Lumbar Exercises: Seated   Other Seated Lumbar Exercises bilateral ER 2x10 red      Lumbar Exercises: Supine   Pelvic Tilt Limitations x10    Clam Limitations 15x; green Tband    Bent Knee Raise 10 reps    Bent Knee Raise Limitations 2 sets    Bridge Limitations x10  PT Education - 03/27/20 0840    Education Details Body mechanics for household activities. HEP    Methods Explanation;Demonstration;Tactile cues;Verbal cues    Comprehension Verbalized understanding;Returned demonstration;Verbal cues required;Tactile cues required            PT Short Term Goals - 03/27/20 1049      PT SHORT TERM GOAL #2   Title Pt will voice understanding of measures to assist in the reduction of her low back and L LE pain.    Status Achieved    Target Date 03/26/20             PT Long Term Goals - 03/27/20 1049      PT LONG TERM GOAL #1   Title Pt will be Ind in a final HEP to maintain or progress her achieved level of function    Status On-going    Target Date 04/24/20      PT LONG TERM GOAL #2   Title Pt will report an improved pain level with her daily and work related activities to 4/10 or less    Baseline 3-10/10    Status On-going    Target Date 04/24/20      PT LONG TERM GOAL #3   Title Pt will return demonstration of proper Optometrist for work and household actitivies     Status On-going    Target Date 03/27/20      PT LONG TERM GOAL #4   Title Pt's FOTO score will improved to the predicted value of 72%    Baseline 47%    Status On-going    Target Date 04/24/20                 Plan - 03/27/20 1032    Clinical Impression Statement Instructed pt in back care/body mechanic priniples for household activities to manage low back strain. Pt voiced understanding. PT was completed for lumbopelvic flexibility and strengthening to promote decompression and stability. Piriformis stretch was adapted for comfort, and  ther exs were added for the abdominals and glutes. Pt is reponding well to PT with decrease in pain and pt demonstrating the understanding of how to manage/decrease her symptoms per ther ex and massage with tennis ball. Pt will benefit from continued PT 2w1, 1w1 for back care education and to progress ther ex to reduce pain and optimize function.    Personal Factors and Comorbidities Comorbidity 2;Time since onset of injury/illness/exacerbation;Profession    Comorbidities Obsesity, Pt reports she needs a L TKR, and has a Hx of a DDD and gout    Examination-Activity Limitations Locomotion Level;Squat;Lift    Stability/Clinical Decision Making Stable/Uncomplicated    Clinical Decision Making Low    Rehab Potential Good    PT Frequency 2x / week   then 1w1   PT Duration 3 weeks    PT Treatment/Interventions ADLs/Self Care Home Management;Cryotherapy;Ship broker;Iontophoresis 4mg /ml Dexamethasone;Therapeutic activities;Therapeutic exercise;Manual techniques;Patient/family education;Passive range of motion;Dry needling;Taping    PT Next Visit Plan reassess FOTO, progress ther ex as indicated, use modalities or manual therapy as indicated, continue back care Ed    PT Home Exercise Plan [id number]    Consulted and Agree with Plan of Care Patient           Patient will benefit from skilled therapeutic  intervention in order to improve the following deficits and impairments:  Postural dysfunction,Decreased strength,Pain,Obesity,Difficulty walking  Visit Diagnosis: Acute left-sided low back pain with left-sided sciatica  Abnormal posture  Problem List Patient Active Problem List   Diagnosis Date Noted  . 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 Class 3 severe obesity due to excess calories with serious comorbidity and body mass index (BMI) of 40.0 to 44.9 in adult (HCC) 08/18/2013  . Essential hypertension 08/07/2008   Joellyn Rued MS, PT 03/27/20 10:55 AM  [facility] [address] [place], [place], [address] Phone: [phone]   Fax:  [phone]  Name: Samantha Pearson MRN: [id number] Date of Birth: [birth year]

## 2020-03-29 ENCOUNTER — Ambulatory Visit: Payer: BC Managed Care – PPO | Admitting: Family Medicine

## 2020-03-29 ENCOUNTER — Other Ambulatory Visit: Payer: Self-pay

## 2020-03-29 ENCOUNTER — Encounter: Payer: Self-pay | Admitting: Family Medicine

## 2020-03-29 VITALS — BP 136/76 | HR 80 | Temp 97.7°F | Ht 59.0 in | Wt 234.0 lb

## 2020-03-29 DIAGNOSIS — M549 Dorsalgia, unspecified: Secondary | ICD-10-CM

## 2020-03-29 DIAGNOSIS — H60391 Other infective otitis externa, right ear: Secondary | ICD-10-CM

## 2020-03-29 MED ORDER — DICLOFENAC SODIUM 75 MG PO TBEC: DELAYED_RELEASE_TABLET | ORAL | 1 refills | Status: DC

## 2020-03-29 MED ORDER — CIPROFLOXACIN-DEXAMETHASONE 0.3-0.1 % OT SUSP: 4.0000 [drp] | Freq: Two times a day (BID) | OTIC | 0 refills | Status: AC

## 2020-03-29 NOTE — Progress Notes (Addendum)
2/21/20224:19 PM  Samantha Pearson 02/09/1960, 60 y.o., female [id number]  Chief Complaint  Patient presents with  . R ear preassure    Off balance x 3 days    HPI:   Patient is a 60 y.o. female with past medical history significant for HTN and gout who presents today for right ear pain  Back pain from last OV is much better Continues to go to PT Noticed right ear pain and pressure for the past 3 days Denies systemic symptoms  Health Maintenance  Topic Date Due  . INFLUENZA VACCINE  05/06/2020 (Originally 09/07/2019)  . COVID-19 Vaccine (3 - Booster for Pfizer series) 07/12/2020  . PAP SMEAR-Modifier  09/10/2021  . MAMMOGRAM  11/20/2021  . TETANUS/TDAP  10/13/2024  . COLONOSCOPY (Pts 45-8yrs Insurance coverage will need to be confirmed)  02/06/2025  . Hepatitis C Screening  Completed  . HIV Screening  Completed     Depression screen [facility] 2/9 03/29/2020 01/20/2020 07/28/2019  Decreased Interest 0 0 0  Down, Depressed, Hopeless 0 0 0  PHQ - 2 Score 0 0 0  Altered sleeping - - -  Tired, decreased energy - - -  Change in appetite - - -  Feeling bad or failure about yourself  - - -  Trouble concentrating - - -  Moving slowly or fidgety/restless - - -  Suicidal thoughts - - -  PHQ-9 Score - - -  Difficult doing work/chores - - -  Some recent data might be hidden    Fall Risk  03/29/2020 02/03/2020 01/20/2020 07/28/2019 01/21/2019  Falls in the past year? 0 0 0 0 0  Number falls in past yr: 0 0 - - -  Injury with Fall? 0 0 - - -  Follow up Falls evaluation completed Falls evaluation completed Falls evaluation completed Falls evaluation completed Falls evaluation completed     Allergies  Allergen Reactions  . Chlorthalidone Other (See Comments)    Severe gout  . Doxycycline Hives, Itching and Swelling  . Norvasc [Amlodipine Besylate] Swelling    Feet swelling  . Septra [Bactrim] Hives, Itching and Swelling  . Latex Swelling and Rash    Prior to Admission  medications   Medication Sig Start Date End Date Taking? Authorizing Provider  diclofenac (VOLTAREN) 75 MG EC tablet TAKE 1 TABLET BY MOUTH 2 TIMES DAILY AS NEEDED FOR MODERATE PAIN. 03/23/20  Yes Ashawn Rinehart, Azalee Course, FNP  diclofenac Sodium (VOLTAREN) 1 % GEL Apply 2 g topically 4 (four) times daily. 02/03/20  Yes Kary Sugrue, Azalee Course, FNP  NON FORMULARY Oregano oil   Yes [provider]  olmesartan (BENICAR) 40 MG tablet Take 1 tablet (40 mg total) by mouth daily. 07/28/19  Yes Sagardia, Eilleen Kempf, MD  Turmeric POWD 1,950 mg by Does not apply route 3 (three) times daily.   Yes [provider]  spironolactone (ALDACTONE) 25 MG tablet Take 1 tablet (25 mg total) by mouth daily. 07/29/19 10/27/19  Georgina Quint, MD    Past Medical History:  Diagnosis Date  . Allergy   . Anxiety   . Arthritis   . Chest pain 08/03/08   Urgent care visit  . Depression   . Dyspnea   . Edema   . Heart murmur   . Hypertension   . Obesity     Past Surgical History:  Procedure Laterality Date  . CESAREAN SECTION  04/16/1980  . DILATION AND CURETTAGE OF UTERUS     laparoscopic  Social History   Tobacco Use  . Smoking status: Never Smoker  . Smokeless tobacco: Never Used  . Tobacco comment: Nonsmoker  Substance Use Topics  . Alcohol use: No    Alcohol/week: 0.0 standard drinks    Family History  Problem Relation Age of Onset  . Prostate cancer Father   . Congestive Heart Failure Father   . Hypertension Father   . Colon cancer Maternal Uncle   . Colon polyps Mother   . Breast cancer Mother 73       Left  . Hyperlipidemia Mother   . Hypertension Mother   . Hypertension Sister   . Hypertension Brother   . Hypertension Sister   . Hypertension Sister   . Hypertension Sister   . Hyperlipidemia Maternal Grandmother   . Hypertension Maternal Grandmother   . Esophageal cancer Neg Hx   . Stomach cancer Neg Hx   . Rectal cancer Neg Hx     Review of Systems  Constitutional:  Negative for chills, fever and malaise/fatigue.  HENT: Positive for ear pain (right) and hearing loss (muffled to the right). Negative for congestion, ear discharge, sinus pain and tinnitus.   Respiratory: Negative for cough and hemoptysis.   Cardiovascular: Negative for chest pain and palpitations.  Musculoskeletal: Negative for myalgias and neck pain.  Neurological: Positive for headaches. Negative for dizziness.     OBJECTIVE:  Today's Vitals   03/29/20 1553  BP: 136/76  Pulse: 80  Temp: 97.7 F (36.5 C)  SpO2: 100%  Weight: 234 lb (106.1 kg)  Height: 4\' 11"  (1.499 m)   Body mass index is 47.26 kg/m.   Physical Exam Constitutional:      General: She is not in acute distress.    Appearance: Normal appearance. She is not ill-appearing.  HENT:     Head: Normocephalic.     Right Ear: Tympanic membrane and external ear normal. There is no impacted cerumen.     Left Ear: Tympanic membrane, ear canal and external ear normal. There is no impacted cerumen.     Ears:     Comments: Right ear canal inflamed Cardiovascular:     Rate and Rhythm: Normal rate and regular rhythm.     Pulses: Normal pulses.     Heart sounds: Normal heart sounds. No murmur heard. No friction rub. No gallop.   Pulmonary:     Effort: Pulmonary effort is normal. No respiratory distress.     Breath sounds: Normal breath sounds. No stridor. No wheezing, rhonchi or rales.  Abdominal:     General: Bowel sounds are normal.     Palpations: Abdomen is soft.     Tenderness: There is no abdominal tenderness.  Musculoskeletal:     Right lower leg: No edema.     Left lower leg: No edema.  Lymphadenopathy:     Head:     Right side of head: No preauricular or posterior auricular adenopathy.     Left side of head: No preauricular or posterior auricular adenopathy.     Cervical:     Right cervical: No superficial or deep cervical adenopathy.    Left cervical: No superficial or deep cervical adenopathy.   Skin:    General: Skin is warm and dry.  Neurological:     Mental Status: She is alert and oriented to person, place, and time.  Psychiatric:        Mood and Affect: Mood normal.        Behavior: Behavior normal.  No results found for this or any previous visit (from the past 24 hour(s)).  No results found.   ASSESSMENT and PLAN  Problem List Items Addressed This Visit   None   Visit Diagnoses    Infective otitis externa of right ear    -  Primary   Relevant Medications   ciprofloxacin-dexamethasone (CIPRODEX) OTIC suspension   Acute left-sided back pain, unspecified back location       Relevant Medications   diclofenac (VOLTAREN) 75 MG EC tablet      Plan . Follow up if no improvement 7 days . RTC/ ED precautions provided . R/se/b of medication discussed   Return in about 3 months (around 06/26/2020).    Macario Carls Ashtan Girtman, FNP-BC [facility] [address] [place], [place] [address] Ph.  [phone] Fax [phone]  I have reviewed and agree with above documentation. Edwina Barth, MD

## 2020-03-29 NOTE — Patient Instructions (Addendum)
  Otitis Externa  Otitis externa is an infection of the outer ear canal. The outer ear canal is the area between the outside of the ear and the eardrum. Otitis externa is sometimes called swimmer's ear. What are the causes? Common causes of this condition include:  Swimming in dirty water.  Moisture in the ear.  An injury to the inside of the ear.  An object stuck in the ear.  A cut or scrape on the outside of the ear. What increases the risk? You are more likely to get this condition if you go swimming often. What are the signs or symptoms?  Itching in the ear. This is often the first symptom.  Swelling of the ear.  Redness in the ear.  Ear pain. The pain may get worse when you pull on your ear.  Pus coming from the ear. How is this treated? This condition may be treated with:  Antibiotic ear drops. These are often given for 10-14 days.  Medicines to reduce itching and swelling. Follow these instructions at home:  If you were given antibiotic ear drops, use them as told by your doctor. Do not stop using them even if your condition gets better.  Take over-the-counter and prescription medicines only as told by your doctor.  Avoid getting water in your ears as told by your doctor. You may be told to avoid swimming or water sports for a few days.  Keep all follow-up visits as told by your doctor. This is important. How is this prevented?  Keep your ears dry. Use the corner of a towel to dry your ears after you swim or bathe.  Try not to scratch or put things in your ear. Doing these things makes it easier for germs to grow in your ear.  Avoid swimming in lakes, dirty water, or pools that may not have the right amount of a chemical called chlorine. Contact a doctor if:  You have a fever.  Your ear is still red, swollen, or painful after 3 days.  You still have pus coming from your ear after 3 days.  Your redness, swelling, or pain gets worse.  You have a  really bad headache.  You have redness, swelling, pain, or tenderness behind your ear. Summary  Otitis externa is an infection of the outer ear canal.  Symptoms include pain, redness, and swelling of the ear.  If you were given antibiotic ear drops, use them as told by your doctor. Do not stop using them even if your condition gets better.  Try not to scratch or put things in your ear. This information is not intended to replace advice given to you by your health care provider. Make sure you discuss any questions you have with your health care provider. Document Revised: 06/29/2017 Document Reviewed: 06/29/2017 Elsevier Patient Education  2021 Elsevier Inc.   If you have lab work done today you will be contacted with your lab results within the next 2 weeks.  If you have not heard from us then please contact us. The fastest way to get your results is to register for My Chart.   IF you received an x-ray today, you will receive an invoice from [facility]. Please contact [facility] at [phone] with questions or concerns regarding your invoice.   IF you received labwork today, you will receive an invoice from [facility]. Please contact [facility] at [phone] with questions or concerns regarding your invoice.   Our billing staff will not be able   to assist you with questions regarding bills from these companies.  You will be contacted with the lab results as soon as they are available. The fastest way to get your results is to activate your My Chart account. Instructions are located on the last page of this paperwork. If you have not heard from us regarding the results in 2 weeks, please contact this office.      

## 2020-03-31 ENCOUNTER — Ambulatory Visit: Payer: BC Managed Care – PPO

## 2020-03-31 ENCOUNTER — Other Ambulatory Visit: Payer: Self-pay

## 2020-03-31 DIAGNOSIS — R293 Abnormal posture: Secondary | ICD-10-CM

## 2020-03-31 DIAGNOSIS — M5442 Lumbago with sciatica, left side: Secondary | ICD-10-CM

## 2020-04-01 NOTE — Therapy (Signed)
[facility] Outpatient Rehabilitation [facility] [address] [place], [place], [address] Phone: [phone]   Fax:  [phone]  Physical Therapy Treatment  Patient Details  Name: Samantha Pearson MRN: [id number] Date of Birth: 04/04/1960 Referring Provider (PT): Just, Azalee Course, FNP   Encounter Date: 03/31/2020   PT End of Session - 03/31/20 1538    Visit Number 5    Number of Visits 7    Date for PT Re-Evaluation 04/24/20    Authorization Type BCBS COMM PPO    Authorization Time Period FOTO on the 5th and 10th visits    PT Start Time 1535    PT Stop Time 1625    PT Time Calculation (min) 50 min    Activity Tolerance Patient tolerated treatment well    Behavior During Therapy [facility] for tasks assessed/performed           Past Medical History:  Diagnosis Date  . Allergy   . Anxiety   . Arthritis   . Chest pain 08/03/08   Urgent care visit  . Depression   . Dyspnea   . Edema   . Heart murmur   . Hypertension   . Obesity     Past Surgical History:  Procedure Laterality Date  . CESAREAN SECTION  04/16/1980  . DILATION AND CURETTAGE OF UTERUS     laparoscopic    There were no vitals filed for this visit.   Subjective Assessment - 03/31/20 1602    Subjective Pt reports she is not having back pain today. She has had some pain at work c prolonged walking a couple of times since the last PT session. She does report having a R ear infection and receiving drops for that, She does report she is experiencing some dizzines and is trying to take it easy.    Patient Stated Goals To have less pain and manage it better.    Currently in Pain? No/denies    Pain Score 0-No pain    Pain Location Back    Pain Orientation Posterior;Left    Pain Descriptors / Indicators Aching;Throbbing;Tightness              [facility] PT Assessment - 03/31/20 0001      Observation/Other Assessments   Focus on Therapeutic Outcomes (FOTO)  69% ability                          [facility] Adult PT Treatment/Exercise - 04/01/20 0001      Exercises   Exercises Lumbar;Knee/Hip      Lumbar Exercises: Standing   Row Both;15 reps    Theraband Level (Row) Level 3 (Green)    Shoulder Extension Both;15 reps    Theraband Level (Shoulder Extension) Level 3 (Green)      Lumbar Exercises: Seated   Other Seated Lumbar Exercises PPT 10x, 3 sec    Other Seated Lumbar Exercises Marching 10x3 c PPT      Knee/Hip Exercises: Seated   Ball Squeeze 15x; 3 sec    Abduction/Adduction  Right;Left;2 sets;10 reps    Abd/Adduction Limitations green Tband                  PT Education - 03/31/20 1641    Education Details HEP or posterior chain strengthening    Person(s) Educated Patient    Methods Explanation;Demonstration;Tactile cues;Verbal cues;Handout    Comprehension Verbalized understanding;Returned demonstration;Verbal cues required;Tactile cues required  PT Short Term Goals - 03/27/20 1049      PT SHORT TERM GOAL #2   Title Pt will voice understanding of measures to assist in the reduction of her low back and L LE pain.    Status Achieved    Target Date 03/26/20             PT Long Term Goals - 03/31/20 1654      PT LONG TERM GOAL #4   Title Pt's FOTO score will improved to the predicted value of 72%. 03/31/20: 69%    Baseline 47%    Status On-going    Target Date 04/24/20                 Plan - 03/31/20 1652    Clinical Impression Statement PT was completed in sitting and standing due to pt experiencing inner ear infection issues. PT was continued for lumbopelvic flexility and strengthening. Pt's FOTO score has improved reflecting pt's verbal report of improved low back pain. Pt reports experiencing pain most consistently at work with prolonged standing and walking on concrete floors. Pt reports utilizing body mechanics as part of her household activities to help reduce low back strain.    Personal  Factors and Comorbidities Comorbidity 2;Time since onset of injury/illness/exacerbation;Profession    Comorbidities Obsesity, Pt reports she needs a L TKR, and has a Hx of a DDD and gout    Examination-Activity Limitations Locomotion Level;Squat;Lift    Stability/Clinical Decision Making Stable/Uncomplicated    Clinical Decision Making Low    Rehab Potential Good    PT Frequency 2x / week    PT Duration 3 weeks    PT Treatment/Interventions ADLs/Self Care Home Management;Cryotherapy;Ship broker;Iontophoresis 4mg /ml Dexamethasone;Therapeutic activities;Therapeutic exercise;Manual techniques;Patient/family education;Passive range of motion;Dry needling;Taping    PT Next Visit Plan Progress ther ex as indicated, use modalities or manual therapy as indicated, continue back care Ed    PT Home Exercise Plan [id number]    Consulted and Agree with Plan of Care Patient           Patient will benefit from skilled therapeutic intervention in order to improve the following deficits and impairments:  Postural dysfunction,Decreased strength,Pain,Obesity,Difficulty walking  Visit Diagnosis: Acute left-sided low back pain with left-sided sciatica  Abnormal posture     Problem List Patient Active Problem List   Diagnosis Date Noted  . 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 Class 3 severe obesity due to excess calories with serious comorbidity and body mass index (BMI) of 40.0 to 44.9 in adult (HCC) 08/18/2013  . Essential hypertension 08/07/2008   10/08/2008 MS, PT 04/01/20 7:13 AM  [facility] [address] [place], [place], [place] Phone: [phone]   Fax:  [phone]  Name: LANELLE LINDO MRN: [name] Date of Birth: [birth year]

## 2020-04-03 ENCOUNTER — Other Ambulatory Visit: Payer: Self-pay

## 2020-04-03 ENCOUNTER — Ambulatory Visit: Payer: BC Managed Care – PPO

## 2020-04-03 DIAGNOSIS — M5442 Lumbago with sciatica, left side: Secondary | ICD-10-CM

## 2020-04-03 DIAGNOSIS — R293 Abnormal posture: Secondary | ICD-10-CM

## 2020-04-03 NOTE — Therapy (Addendum)
[place], [place], [address] Phone: [phone]   Fax:  [phone]  Physical Therapy Treatment/Discharge  Patient Details  Name: Samantha Pearson MRN: [id number] Date of Birth: Jun 08, 1960 Referring Provider (PT): Just, Laurita Quint, FNP   Encounter Date: 04/03/2020   PT End of Session - 04/03/20 0944     Visit Number 6    Number of Visits 7    Date for PT Re-Evaluation 04/24/20    Authorization Type [employer] COMM PPO    Authorization Time Period FOTO on the 5th and 10th visits    PT Start Time 0944    PT Stop Time 1030    PT Time Calculation (min) 46 min    Activity Tolerance Patient tolerated treatment well    Behavior During Therapy [facility] for tasks assessed/performed             Past Medical History:  Diagnosis Date   Allergy    Anxiety    Arthritis    Chest pain 08/03/08   Urgent care visit   Depression    Dyspnea    Edema    Heart murmur    Hypertension    Obesity     Past Surgical History:  Procedure Laterality Date   CESAREAN SECTION  04/16/1980   DILATION AND CURETTAGE OF UTERUS     laparoscopic    There were no vitals filed for this visit.   Subjective Assessment - 04/03/20 0951     Subjective Pt reports she is contining to do well. No issues with back pain even at work.    Patient Stated Goals To have less pain and manage it better.    Currently in Pain? Yes    Pain Score 0-No pain    Pain Location Back    Pain Orientation Left    Pain Descriptors / Indicators Aching;Throbbing;Tightness    Pain Type Chronic pain    Pain Onset More than a month ago    Pain Frequency Intermittent                               [facility] Adult PT Treatment/Exercise - 04/03/20 0001       Ambulation/Gait   Stairs Yes    Stairs Assistance 7: Independent    Stair Management Technique One rail Left;Step to pattern   lead c good leg up, and bad leg down   Number of Stairs 4       Exercises   Exercises Lumbar;Knee/Hip      Lumbar Exercises: Stretches   Active Hamstring Stretch Right;Left;2 reps;20 seconds    Single Knee to Chest Stretch Right;Left;2 reps;10 seconds    Lower Trunk Rotation 5 reps   5 sec   Other Lumbar Stretch Exercise Seated forward flexion as tolerated 3x, 10 sec      Lumbar Exercises: Standing   Row Both;15 reps   2 sets   Theraband Level (Row) Level 3 (Green)    Shoulder Extension Both;15 reps   2 sets   Theraband Level (Shoulder Extension) Level 3 (Green)    Other Standing Lumbar Exercises Palloff press 10x; green Tband    Other Standing Lumbar Exercises Hinged hip lift c 10lb 10x from 15 inches      Lumbar Exercises: Supine   Pelvic Tilt Limitations x10    Clam Limitations 15x; green Tband    Bent Knee Raise 10 reps  Dead Bug 10 reps    Dead Bug Limitations legs only    Bridge Limitations x15                    PT Education - 04/03/20 1431     Education Details Ed on hinged hip lifting              PT Short Term Goals - 03/27/20 1049       PT SHORT TERM GOAL #2   Title Pt will voice understanding of measures to assist in the reduction of her low back and L LE pain.    Status Achieved    Target Date 03/26/20               PT Long Term Goals - 03/31/20 1654       PT LONG TERM GOAL #4   Title Pt's FOTO score will improved to the predicted value of 72%. 03/31/20: 69%    Baseline 47%    Status On-going    Target Date 04/24/20                   Plan - 04/03/20 0945     Clinical Impression Statement Pt is responding well to the course of PT. Provided instruction concerning hinged hip lifting. Pt returned proper  demonstration of technique.    Personal Factors and Comorbidities Comorbidity 2;Time since onset of injury/illness/exacerbation;Profession    Comorbidities Obsesity, Pt reports she needs a L TKR, and has a Hx of a DDD and gout    Examination-Activity Limitations Locomotion  Level;Squat;Lift    Stability/Clinical Decision Making Stable/Uncomplicated    Clinical Decision Making Low    Rehab Potential Good    PT Frequency 2x / week    PT Duration 3 weeks    PT Treatment/Interventions ADLs/Self Care Home Management;Cryotherapy;Surveyor, minerals;Iontophoresis 61m/ml Dexamethasone;Therapeutic activities;Therapeutic exercise;Manual techniques;Patient/family education;Passive range of motion;Dry needling;Taping;Joint Manipulations    PT Next Visit Plan Re-assess status. Complete FOTO. Possible DC next visit.    PT Home Exercise Plan [id number]    [id number] Agree with Plan of Care Patient             Patient will benefit from skilled therapeutic intervention in order to improve the following deficits and impairments:  Postural dysfunction,Decreased strength,Pain,Obesity,Difficulty walking  Visit Diagnosis: Acute left-sided low back pain with left-sided sciatica  Abnormal posture     Problem List Patient Active Problem List   Diagnosis Date Noted   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   Class 3 severe obesity due to excess calories with serious comorbidity and body mass index (BMI) of 40.0 to 44.9 in adult ([facility] 08/18/2013   Essential hypertension 08/07/2008    AGar PontoMS, PT 04/03/20 3:07 PM  [facility] [address]Phone: [phone]  Fax:  [phone] Name: Samantha DORIAMRN: [id number] of Birth: [birth year] PHYSICAL THERAPY DISCHARGE SUMMARY  Visits from Start of Care: 6  Current functional level related to goals / functional outcomes: See above   Remaining deficits: See above   Education / Equipment: HEP  Patient agrees to discharge. Patient goals were partially met. Patient is being discharged due to not returning to PT after he last visit  AGar PontoMS,  PT 09/04/20 7:52 AM

## 2020-04-19 IMAGING — DX DG CHEST 2V
2 series · 2 of 2 positions shown · non-contrast
Comparison: 08/03/2008 radiographs

CLINICAL DATA: Shortness of breath and LOWER extremity edema.

EXAM:
CHEST - 2 VIEW

[chest pa]
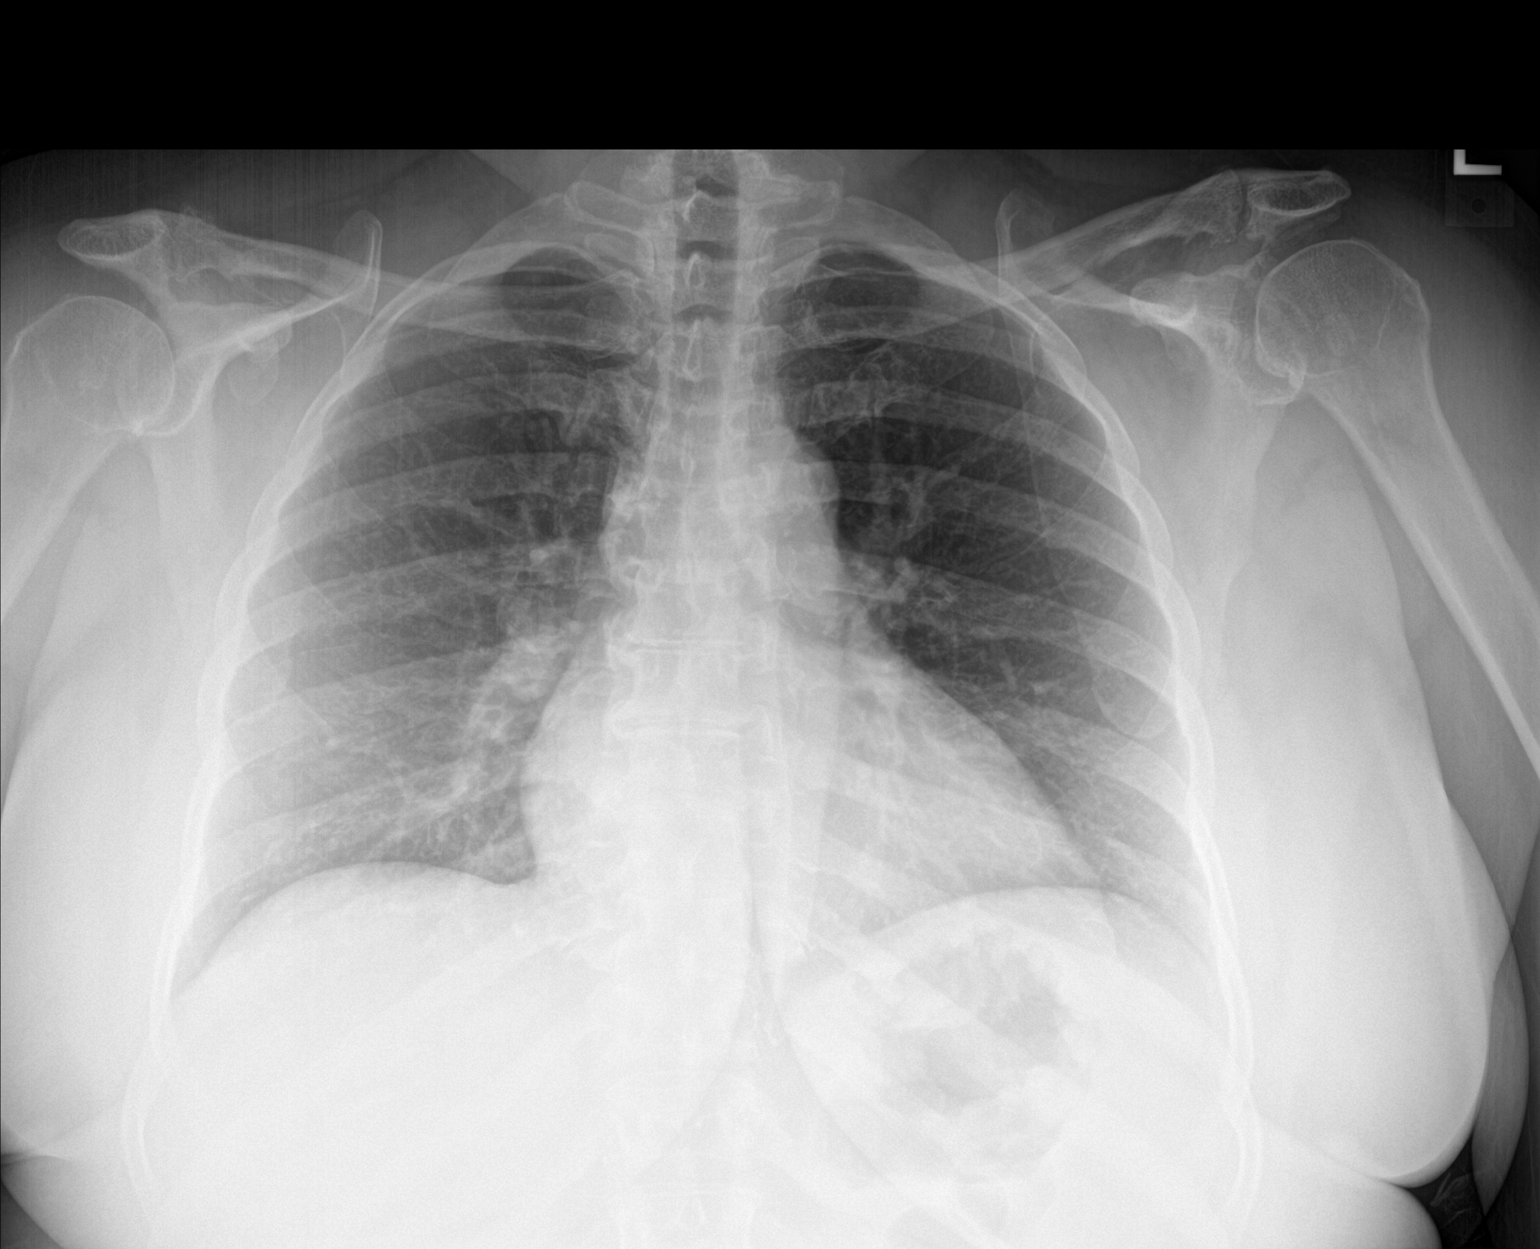

[chest lat]
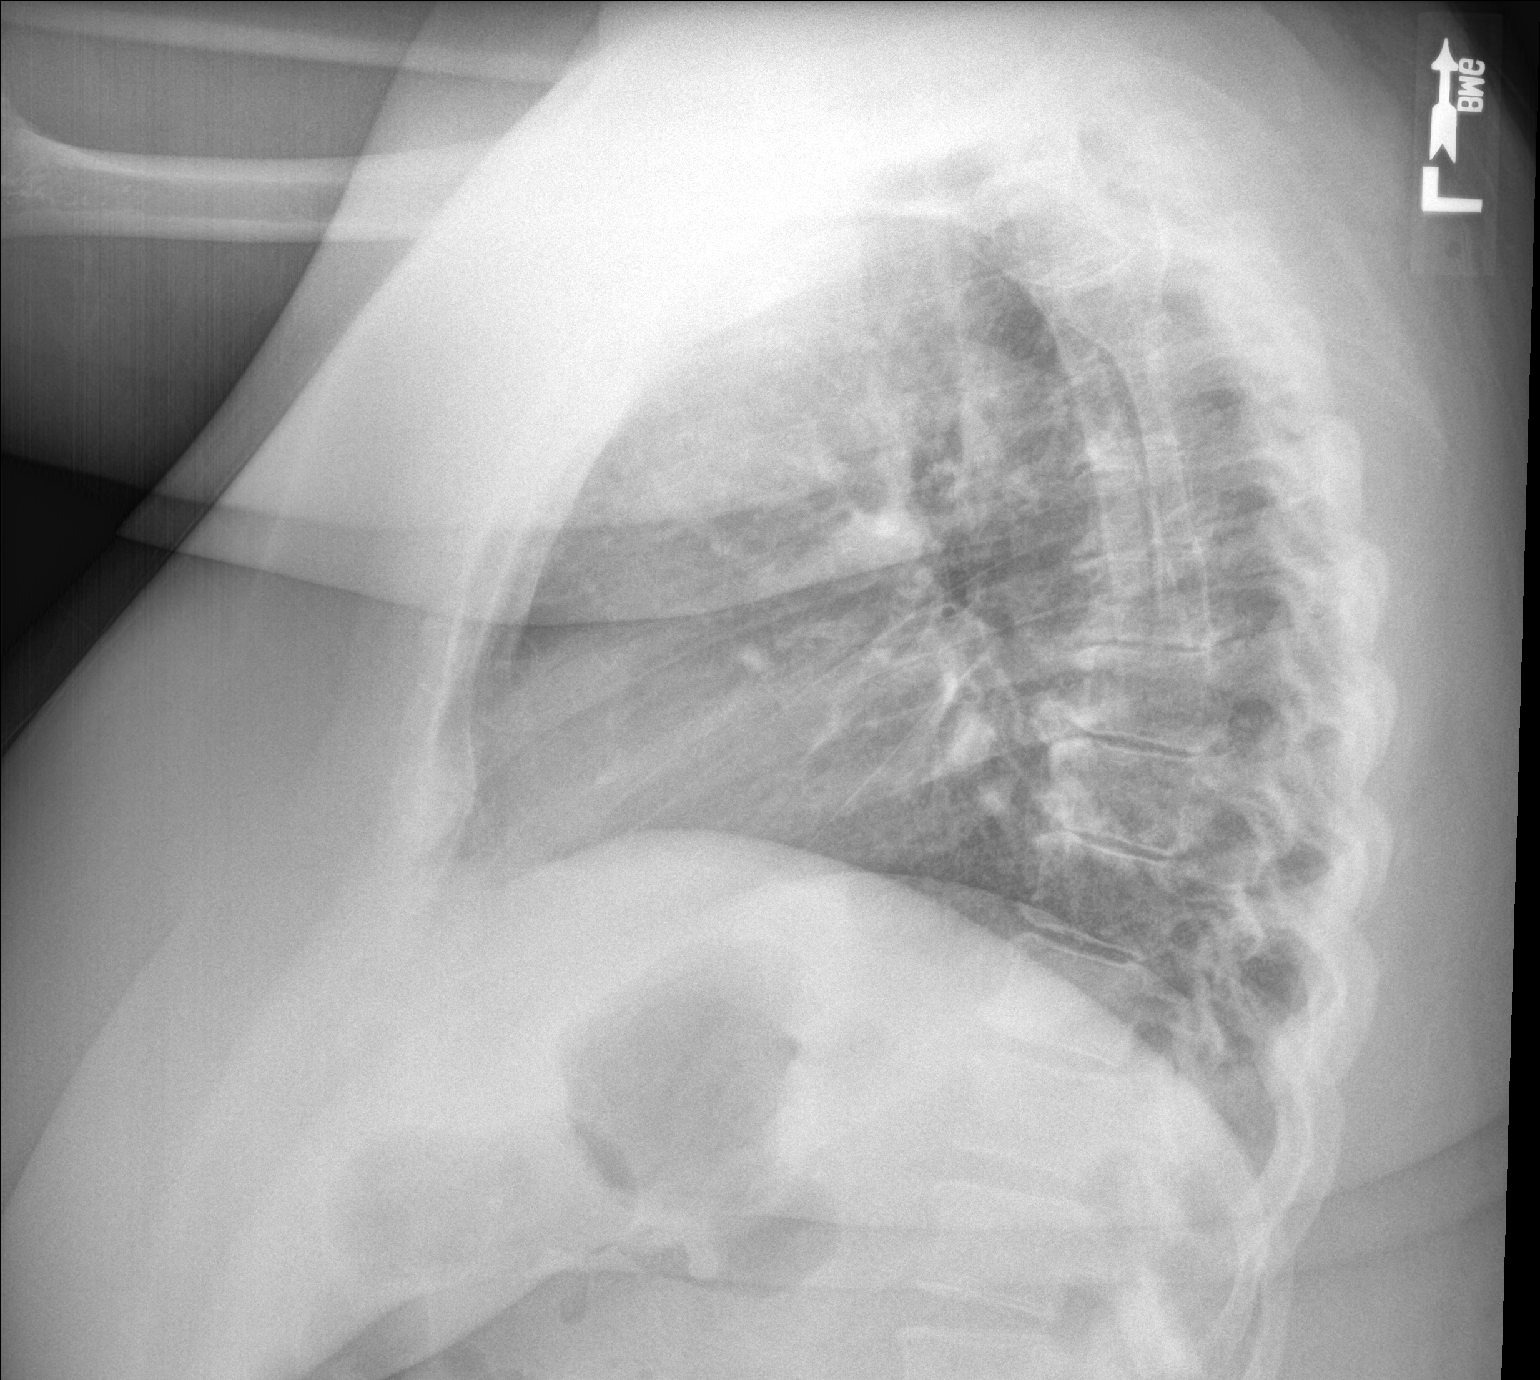

[2 of 2 positions shown; findings below may reference images not displayed]

FINDINGS: The cardiomediastinal silhouette is unremarkable.

There is no evidence of focal airspace disease, pulmonary edema,
suspicious pulmonary nodule/mass, pleural effusion, or pneumothorax.

No acute bony abnormalities are identified.
IMPRESSION: No active cardiopulmonary disease.

## 2020-04-21 ENCOUNTER — Ambulatory Visit: Payer: BC Managed Care – PPO

## 2020-05-22 ENCOUNTER — Emergency Department (HOSPITAL_COMMUNITY): Payer: BC Managed Care – PPO

## 2020-05-22 ENCOUNTER — Encounter (HOSPITAL_COMMUNITY): Payer: Self-pay

## 2020-05-22 ENCOUNTER — Emergency Department (HOSPITAL_COMMUNITY)
Admission: EM | Admit: 2020-05-22 | Discharge: 2020-05-23 | Disposition: A | Discharge status: Home / Self Care | Payer: BC Managed Care – PPO | Attending: Emergency Medicine | Admitting: Emergency Medicine

## 2020-05-22 ENCOUNTER — Other Ambulatory Visit: Payer: Self-pay

## 2020-05-22 DIAGNOSIS — J0100 Acute maxillary sinusitis, unspecified: Secondary | ICD-10-CM

## 2020-05-22 DIAGNOSIS — R059 Cough, unspecified: Secondary | ICD-10-CM | POA: Diagnosis not present

## 2020-05-22 DIAGNOSIS — Z79899 Other long term (current) drug therapy: Secondary | ICD-10-CM | POA: Insufficient documentation

## 2020-05-22 DIAGNOSIS — Z20822 Contact with and (suspected) exposure to covid-19: Secondary | ICD-10-CM | POA: Insufficient documentation

## 2020-05-22 DIAGNOSIS — I10 Essential (primary) hypertension: Secondary | ICD-10-CM | POA: Diagnosis not present

## 2020-05-22 DIAGNOSIS — R0981 Nasal congestion: Secondary | ICD-10-CM | POA: Diagnosis not present

## 2020-05-22 DIAGNOSIS — Z91040 Latex allergy status: Secondary | ICD-10-CM | POA: Insufficient documentation

## 2020-05-22 NOTE — ED Provider Notes (Signed)
Emergency Medicine Provider Triage Evaluation Note  Samantha Pearson , a 60 y.o. female  was evaluated in triage.  Pt complains of URI sx since yesterday. Feels more short of breath and congested in nose when laying down last night. No fever or sick contacts. No LE swelling.   Review of Systems  Positive: Scratchy, throat,HA, congestion, dry cough Negative: fever  Physical Exam  BP (!) 169/95 (BP Location: Right Arm)   Pulse (!) 103   Temp 98.9 F (37.2 C) (Oral)   Resp 18   Ht 4\' 11"  (1.499 m)   Wt 101.2 kg   LMP 04/09/2014   SpO2 96%   BMI 45.04 kg/m  Gen:   Awake, no distress   HEENT:  Atraumatic  Resp:  Normal effort  Cardiac:  Normal rate  Abd:   Nondistended, MSK:   Moves extremities without difficulty  Neuro:  Speech clear   Medical Decision Making  Medically screening exam initiated at 11:05 PM.  Appropriate orders placed.  Samantha Pearson was informed that the remainder of the evaluation will be completed by another provider, this initial triage assessment does not replace that evaluation, and the importance of remaining in the ED until their evaluation is complete.  Clinical Impression  URI sx   Lavelle Akel, [name] N, PA-C 05/22/20 2307    2308, MD 05/27/20 1021

## 2020-05-22 NOTE — ED Triage Notes (Signed)
Patient arrived with complaints of a scratchy throat, headache, and nasal drainage since yesterday. States she started taking otc medication but still having a dry cough and congestion. Reports when laying down she felt short of breath which resolved when standing.

## 2020-05-23 LAB — SARS CORONAVIRUS 2 (TAT 6-24 HRS): SARS Coronavirus 2: NEGATIVE

## 2020-05-23 MED ORDER — BENZONATATE 100 MG PO CAPS: 100.0000 mg | ORAL_CAPSULE | Freq: Two times a day (BID) | ORAL | 0 refills | Status: DC | PRN

## 2020-05-23 MED ORDER — IPRATROPIUM BROMIDE 0.03 % NA SOLN: 2.0000 | Freq: Two times a day (BID) | NASAL | 0 refills | Status: DC

## 2020-05-23 MED ORDER — SALINE SPRAY 0.65 % NA SOLN: 1.0000 | NASAL | 0 refills | Status: DC | PRN

## 2020-05-23 NOTE — ED Provider Notes (Signed)
[facility]-EMERGENCY DEPT Provider Note   CSN: [id number] Arrival date & time: 05/22/20  2247     History Chief Complaint  Patient presents with  . Nasal Congestion    Samantha Pearson is a 60 y.o. female.  Patient presents to the emergency department with chief complaint of sinus congestion.  She states her symptoms started yesterday.  She denies any fever.  She reports associated sore throat and cough.  She states that she might have seasonal allergies.  She states that she had a coughing spell tonight that was hard to catch her breath.  She denies any successful treatments prior to arrival.  The history is provided by the patient. No language interpreter was used.       Past Medical History:  Diagnosis Date  . Allergy   . Anxiety   . Arthritis   . Chest pain 08/03/08   Urgent care visit  . Depression   . Dyspnea   . Edema   . Heart murmur   . Hypertension   . Obesity     Patient Active Problem List   Diagnosis Date Noted  . 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 Class 3 severe obesity due to excess calories with serious comorbidity and body mass index (BMI) of 40.0 to 44.9 in adult ([facility]) 08/18/2013  . Essential hypertension 08/07/2008    Past Surgical History:  Procedure Laterality Date  . CESAREAN SECTION  04/16/1980  . DILATION AND CURETTAGE OF UTERUS     laparoscopic     OB History   No obstetric history on file.     Family History  Problem Relation Age of Onset  . Prostate cancer Father   . Congestive Heart Failure Father   . Hypertension Father   . Colon cancer Maternal Uncle   . Colon polyps Mother   . Breast cancer Mother 30       Left  . Hyperlipidemia Mother   . Hypertension Mother   . Hypertension Sister   . Hypertension Brother   . Hypertension Sister   . Hypertension Sister   . Hypertension Sister   . Hyperlipidemia Maternal Grandmother   . Hypertension Maternal Grandmother   .  Esophageal cancer Neg Hx   . Stomach cancer Neg Hx   . Rectal cancer Neg Hx     Social History   Tobacco Use  . Smoking status: Never Smoker  . Smokeless tobacco: Never Used  . Tobacco comment: Nonsmoker  Substance Use Topics  . Alcohol use: No    Alcohol/week: 0.0 standard drinks  . Drug use: No    Home Medications Prior to Admission medications   Medication Sig Start Date End Date Taking? Authorizing Provider  benzonatate (TESSALON) 100 MG capsule Take 1 capsule (100 mg total) by mouth 2 (two) times daily as needed for cough. 05/23/20  Yes Roxy Horseman, PA-C  ipratropium (ATROVENT) 0.03 % nasal spray Place 2 sprays into both nostrils every 12 (twelve) hours. 05/23/20  Yes Roxy Horseman, PA-C  sodium chloride (OCEAN) 0.65 % SOLN nasal spray Place 1 spray into both nostrils as needed for congestion. 05/23/20  Yes Roxy Horseman, PA-C  diclofenac (VOLTAREN) 75 MG EC tablet TAKE 1 TABLET BY MOUTH 2 TIMES DAILY AS NEEDED FOR MODERATE PAIN. 03/29/20   Just, Azalee Course, FNP  diclofenac Sodium (VOLTAREN) 1 % GEL Apply 2 g topically 4 (four) times daily. 02/03/20   Just, Azalee Course, FNP  NON FORMULARY Dalphine Handing  oil    [provider]  olmesartan (BENICAR) 40 MG tablet Take 1 tablet (40 mg total) by mouth daily. 07/28/19   Georgina Quint, MD  spironolactone (ALDACTONE) 25 MG tablet Take 1 tablet (25 mg total) by mouth daily. 07/29/19 10/27/19  Georgina Quint, MD  Turmeric POWD 1,950 mg by Does not apply route 3 (three) times daily.    [provider]    Allergies    Chlorthalidone, Doxycycline, Norvasc [amlodipine besylate], Septra [bactrim], and Latex  Review of Systems   Review of Systems  All other systems reviewed and are negative.   Physical Exam Updated Vital Signs BP (!) 139/110   Pulse 70   Temp 98.9 F (37.2 C) (Oral)   Resp 18   Ht 4\' 11"  (1.499 m)   Wt 101.2 kg   LMP 04/09/2014   SpO2 100%   BMI 45.04 kg/m   Physical Exam Vitals and  nursing note reviewed.  Constitutional:      General: She is not in acute distress.    Appearance: She is well-developed.  HENT:     Head: Normocephalic and atraumatic.     Right Ear: Tympanic membrane normal.     Left Ear: Tympanic membrane normal.     Nose: Congestion present.     Comments: Maxillary sinus tenderness    Mouth/Throat:     Pharynx: Oropharynx is clear. No oropharyngeal exudate or posterior oropharyngeal erythema.  Eyes:     Conjunctiva/sclera: Conjunctivae normal.  Cardiovascular:     Rate and Rhythm: Normal rate.     Heart sounds: No murmur heard.   Pulmonary:     Effort: Pulmonary effort is normal. No respiratory distress.  Abdominal:     General: There is no distension.  Musculoskeletal:     Cervical back: Neck supple.     Comments: Moves all extremities  Skin:    General: Skin is warm and dry.  Neurological:     Mental Status: She is alert and oriented to person, place, and time.  Psychiatric:        Mood and Affect: Mood normal.        Behavior: Behavior normal.     ED Results / Procedures / Treatments   Labs (all labs ordered are listed, but only abnormal results are displayed) Labs Reviewed  SARS CORONAVIRUS 2 (TAT 6-24 HRS)    EKG None  Radiology DG Chest Port 1 View  Result Date: 05/22/2020 CLINICAL DATA:  Cough EXAM: PORTABLE CHEST 1 VIEW COMPARISON:  06/08/2017 FINDINGS: Low lung volumes. Heart and mediastinal contours are within normal limits. No focal opacities or effusions. No acute bony abnormality. IMPRESSION: No active disease. Electronically Signed   By: 08/08/2017 M.D.   On: 05/22/2020 23:26    Procedures Procedures   Medications Ordered in ED Medications - No data to display  ED Course  I have reviewed the triage vital signs and the nursing notes.  Pertinent labs & imaging results that were available during my care of the patient were reviewed by me and considered in my medical decision making (see chart for  details).    MDM Rules/Calculators/A&P                          Patient here with symptoms consistent with sinus infection.  Presumed viral at this point.  Symptoms started yesterday.  Patient is afebrile. Final Clinical Impression(s) / ED Diagnoses Final diagnoses:  Acute maxillary  sinusitis, recurrence not specified    Rx / DC Orders ED Discharge Orders         Ordered    ipratropium (ATROVENT) 0.03 % nasal spray  Every 12 hours        05/23/20 0133    sodium chloride (OCEAN) 0.65 % SOLN nasal spray  As needed        05/23/20 0133    benzonatate (TESSALON) 100 MG capsule  2 times daily PRN        05/23/20 0133           Roxy Horseman, PA-C 05/23/20 0135    Molpus, Jonny Ruiz, MD 05/23/20 [phone]

## 2020-05-23 NOTE — ED Notes (Signed)
Discharged at this time AVS reviewed no concerns at this time

## 2020-06-01 ENCOUNTER — Telehealth (HOSPITAL_BASED_OUTPATIENT_CLINIC_OR_DEPARTMENT_OTHER): Payer: Self-pay | Admitting: Cardiology

## 2020-06-01 DIAGNOSIS — M109 Gout, unspecified: Secondary | ICD-10-CM | POA: Diagnosis not present

## 2020-06-01 DIAGNOSIS — M1990 Unspecified osteoarthritis, unspecified site: Secondary | ICD-10-CM | POA: Diagnosis not present

## 2020-06-01 DIAGNOSIS — M79672 Pain in left foot: Secondary | ICD-10-CM | POA: Diagnosis not present

## 2020-06-01 DIAGNOSIS — I10 Essential (primary) hypertension: Secondary | ICD-10-CM | POA: Diagnosis not present

## 2020-06-01 NOTE — Telephone Encounter (Signed)
Patient called stating she would like to stay with Dr. Cristal Deer at [facility]

## 2020-07-15 ENCOUNTER — Other Ambulatory Visit: Payer: Self-pay

## 2020-07-15 ENCOUNTER — Ambulatory Visit: Payer: BC Managed Care – PPO | Admitting: Emergency Medicine

## 2020-07-15 ENCOUNTER — Encounter: Payer: Self-pay | Admitting: Emergency Medicine

## 2020-07-15 VITALS — BP 150/80 | HR 67 | Temp 98.8°F | Ht 59.0 in | Wt 230.2 lb

## 2020-07-15 DIAGNOSIS — Z6841 Body Mass Index (BMI) 40.0 and over, adult: Secondary | ICD-10-CM | POA: Diagnosis not present

## 2020-07-15 DIAGNOSIS — I10 Essential (primary) hypertension: Secondary | ICD-10-CM | POA: Diagnosis not present

## 2020-07-15 LAB — HEMOGLOBIN A1C: Hgb A1c MFr Bld: 5.7 % (ref 4.6–6.5)

## 2020-07-15 LAB — CBC WITH DIFFERENTIAL/PLATELET
Basophils Absolute: 0 10*3/uL (ref 0.0–0.1)
Basophils Relative: 0.7 % (ref 0.0–3.0)
Eosinophils Absolute: 0.3 10*3/uL (ref 0.0–0.7)
Eosinophils Relative: 4.1 % (ref 0.0–5.0)
HCT: 37 % (ref 36.0–46.0)
Hemoglobin: 12.2 g/dL (ref 12.0–15.0)
Lymphocytes Relative: 40.3 % (ref 12.0–46.0)
Lymphs Abs: 2.6 10*3/uL (ref 0.7–4.0)
MCHC: 33 g/dL (ref 30.0–36.0)
MCV: 84.5 fl (ref 78.0–100.0)
Monocytes Absolute: 0.5 10*3/uL (ref 0.1–1.0)
Monocytes Relative: 8.1 % (ref 3.0–12.0)
Neutro Abs: 3.1 10*3/uL (ref 1.4–7.7)
Neutrophils Relative %: 46.8 % (ref 43.0–77.0)
Platelets: 255 10*3/uL (ref 150.0–400.0)
RBC: 4.38 Mil/uL (ref 3.87–5.11)
RDW: 13.7 % (ref 11.5–15.5)
WBC: 6.5 10*3/uL (ref 4.0–10.5)

## 2020-07-15 LAB — LIPID PANEL
Cholesterol: 206 mg/dL — ABNORMAL HIGH (ref 0–200)
HDL: 56.6 mg/dL (ref >39.00)
LDL Cholesterol: 119 mg/dL — ABNORMAL HIGH (ref 0–99)
NonHDL: 149.88
Total CHOL/HDL Ratio: 4
Triglycerides: 152 mg/dL — ABNORMAL HIGH (ref 0.0–149.0)
VLDL: 30.4 mg/dL (ref 0.0–40.0)

## 2020-07-15 LAB — COMPREHENSIVE METABOLIC PANEL
ALT: 59 U/L — ABNORMAL HIGH (ref 0–35)
AST: 46 U/L — ABNORMAL HIGH (ref 0–37)
Albumin: 4.6 g/dL (ref 3.5–5.2)
Alkaline Phosphatase: 74 U/L (ref 39–117)
BUN: 32 mg/dL — ABNORMAL HIGH (ref 6–23)
CO2: 28 mEq/L (ref 19–32)
Calcium: 10 mg/dL (ref 8.4–10.5)
Chloride: 101 mEq/L (ref 96–112)
Creatinine, Ser: 1.2 mg/dL (ref 0.40–1.20)
GFR: 49.68 mL/min — ABNORMAL LOW (ref >60.00)
Glucose, Bld: 85 mg/dL (ref 70–99)
Potassium: 4.6 mEq/L (ref 3.5–5.1)
Sodium: 138 mEq/L (ref 135–145)
Total Bilirubin: 1 mg/dL (ref 0.2–1.2)
Total Protein: 8 g/dL (ref 6.0–8.3)

## 2020-07-15 NOTE — Assessment & Plan Note (Signed)
Elevated blood pressure readings at home and in the office. Will increase Aldactone to 50 mg daily.  Continue olmesartan 40 mg daily. Dietary approaches to stop hypertension discussed with patient. Follow-up in 3 months.

## 2020-07-15 NOTE — Progress Notes (Signed)
Cardiology Office Note:    Date:  07/16/2020   ID:  Samantha Pearson, DOB 07-26-1960, MRN [id number]  PCP:  Georgina Quint, MD  Cardiologist: Jodelle Red, MD PhD  Referring MD: Georgina Quint, *   CC: follow up  History of Present Illness:    Samantha Pearson is a 60 y.o. female with a hx of hypertension and obesity who is seen in follow up today.   Echo done 08/2017 showed mild diastolic dysfunction. Declined sleep study, not covered for in lab but could do home sleep study, but had out of pocket cost.   Medication history: Tried chlorthalidone, caused severe gout. Has had foot swelling on amlodipine in the past, doesn't tolerate.   Today: Overall she is feeling okay, however she recently lost a friend. She has no new cardiovascular complaints today. Lately she had a minor flare-up of gout and LE edema, possibly due to something she ate. However she is doing better today.  Yesterday she visited her PCP, who increased her dose of spironolactone to 50 mg rather than begin a third blood pressure medication. Spironolactone had previously been decreased to 25 mg due to gradually increasing potassium levels. She is trying to drink more water. She will follow-up with her PCP for labs and monitoring.  She has been trying to contact a nutritionist, but is working third shift and has been unsuccessful with this. Sometimes she is able to sleep well, but not often. She may have 4 hours to 10 hours of sleep at a time. Her mother lives with her and they live in an apartment complex. Also she has a part time job cleaning a building.    She denies any chest pain, shortness of breath, palpitations, or exertional symptoms. No headaches, lightheadedness, or syncope to report. Also has no orthopnea or PND.   Past Medical History:  Diagnosis Date   Allergy    Anxiety    Arthritis    Chest pain 08/03/08   Urgent care visit   Depression    Dyspnea    Edema    Heart murmur     Hypertension    Obesity     Past Surgical History:  Procedure Laterality Date   CESAREAN SECTION  04/16/1980   DILATION AND CURETTAGE OF UTERUS     laparoscopic    Current Medications: Current Outpatient Medications on File Prior to Visit  Medication Sig   diclofenac Sodium (VOLTAREN) 1 % GEL Apply 2 g topically 4 (four) times daily.   ipratropium (ATROVENT) 0.03 % nasal spray Place 2 sprays into both nostrils every 12 (twelve) hours.   NON FORMULARY Oregano oil   olmesartan (BENICAR) 40 MG tablet Take 1 tablet (40 mg total) by mouth daily.   sodium chloride (OCEAN) 0.65 % SOLN nasal spray Place 1 spray into both nostrils as needed for congestion.   spironolactone (ALDACTONE) 25 MG tablet Take 1 tablet (25 mg total) by mouth daily. (Patient taking differently: Take 50 mg by mouth daily.)   Turmeric POWD 1,950 mg by Does not apply route 3 (three) times daily.   No current facility-administered medications on file prior to visit.     Allergies:   Chlorthalidone, Doxycycline, Norvasc [amlodipine besylate], Septra [bactrim], and Latex   Social History   Tobacco Use   Smoking status: Never   Smokeless tobacco: Never   Tobacco comments:    Nonsmoker  Substance Use Topics   Alcohol use: No    Alcohol/week: 0.0 standard drinks  Drug use: No   Family History: The patient's family history includes Breast cancer (age of onset: 26) in her mother; Colon cancer in her maternal uncle; Colon polyps in her mother; Congestive Heart Failure in her father; Hyperlipidemia in her maternal grandmother and mother; Hypertension in her brother, father, maternal grandmother, mother, sister, sister, sister, and sister; Prostate cancer in her father. There is no history of Esophageal cancer, Stomach cancer, or Rectal cancer.   ROS:   Please see the history of present illness.   (+) LE edema Additional pertinent ROS otherwise unremarkable.  EKGs/Labs/Other Studies Reviewed:    The following  studies were personally reviewed today:  Echo 08/30/17 - Left ventricle: The cavity size was normal. Wall thickness was   normal. Systolic function was normal. The estimated ejection   fraction was in the range of 50% to 55%. Wall motion was normal;   there were no regional wall motion abnormalities. Doppler   parameters are consistent with abnormal left ventricular   relaxation (grade 1 diastolic dysfunction).   Impressions:  - Normal LV systolic function; mild diastolic dysfunction.  EKG:  EKG is personally reviewed today.   07/16/2020: EKG is not ordered today.  01/14/2020: normal sinus rhythm at 77 bpm  Recent Labs: 07/15/2020: ALT 59; BUN 32; Creatinine, Ser 1.20; Hemoglobin 12.2; Platelets 255.0; Potassium 4.6; Sodium 138  Recent Lipid Panel    Component Value Date/Time   CHOL 206 (H) 07/15/2020 0914   CHOL 192 01/20/2020 1104   TRIG 152.0 (H) 07/15/2020 0914   HDL 56.60 07/15/2020 0914   HDL 58 01/20/2020 1104   CHOLHDL 4 07/15/2020 0914   VLDL 30.4 07/15/2020 0914   LDLCALC 119 (H) 07/15/2020 0914   LDLCALC 108 (H) 01/20/2020 1104    Physical Exam:    VS:  BP 138/70 (BP Location: Left Arm, Patient Position: Sitting, Cuff Size: Large)   Pulse 82   Ht 4\' 11"  (1.499 m)   Wt 227 lb (103 kg)   LMP 04/09/2014   SpO2 96%   BMI 45.85 kg/m     Wt Readings from Last 3 Encounters:  07/16/20 227 lb (103 kg)  07/15/20 230 lb 3.2 oz (104.4 kg)  05/22/20 223 lb (101.2 kg)    GEN: Well nourished, well developed in no acute distress HEENT: Normal, moist mucous membranes NECK: No JVD CARDIAC: regular rhythm, normal S1 and S2, no rubs or gallops. No murmur. VASCULAR: Radial and DP pulses 2+ bilaterally. No carotid bruits RESPIRATORY:  Clear to auscultation without rales, wheezing or rhonchi  ABDOMEN: Soft, non-tender, non-distended MUSCULOSKELETAL:  Ambulates independently SKIN: Warm and dry, no edema NEUROLOGIC:  Alert and oriented x 3. No focal neuro deficits  noted. PSYCHIATRIC:  Normal affect   ASSESSMENT:    1. Essential hypertension   2. Class 3 severe obesity due to excess calories with serious comorbidity and body mass index (BMI) of 45.0 to 49.9 in adult [facility])   3. Cardiac risk counseling   4. Counseling on health promotion and disease prevention     PLAN:    Hypertension: goal of <130/80. Not yet at goal but improved today compared to yesterday -severe gout on chlorthalidone, swelling on amlodipine -continue spironolactone and olmesartan. Spironolactone increased yesterday by Dr. [facility]. Has been on this dose in the past but had to decrease due to elevated potassium. Monitoring labs and response with Dr. Alvy Bimler.  Class 3 obesity counseled on lifestyle, diet, exercise today. She plans to focus on this to avoid additional  BP medications. We did discuss GLP1RA for weight loss today, she will think about it.  Prevention and health counseling: -recommend heart healthy/Mediterranean diet, with whole grains, fruits, vegetable, fish, lean meats, nuts, and olive oil. Limit salt. -recommend moderate walking, 3-5 times/week for 30-50 minutes each session. Aim for at least 150 minutes.week. Goal should be pace of 3 miles/hours, or walking 1.5 miles in 30 minutes -recommend avoidance of tobacco products. Avoid excess alcohol. -Additional risk factor control:  -Diabetes risk: A1c is 5.5 07/2019  -Lipids: from 07/28/19 Tchol 203, HDL 53, LDL 126, TG 134. No longer taking rosuvastatin  -Blood pressure control: as above  -Weight: BMI 45. Class 3 severe obesity. Counseled on diet and exercise today, planning to work with nutritionist. -ASCVD risk score: The 10-year ASCVD risk score Denman George DC Montez Hageman., et al., 2013) is: 4.7%   Values used to calculate the score:     Age: 23 years     Sex: Female     Is Non-Hispanic African American: No     Diabetic: No     Tobacco smoker: No     Systolic Blood Pressure: 138 mmHg     Is BP treated: Yes     HDL  Cholesterol: 56.6 mg/dL     Total Cholesterol: 206 mg/dL   Follow up in 1 year or sooner PRN  Medication Adjustments/Labs and Tests Ordered: Current medicines are reviewed at length with the patient today.  Concerns regarding medicines are outlined above.  No orders of the defined types were placed in this encounter.  No orders of the defined types were placed in this encounter.   Patient Instructions  Medication Instructions:  Your Physician recommend you continue on your current medication as directed.    *If you need a refill on your cardiac medications before your next appointment, please call your pharmacy*   Lab Work: None ordered today   Testing/Procedures: None ordered today   Follow-Up: At [facility], you and your health needs are our priority.  As part of our continuing mission to provide you with exceptional heart care, we have created designated Provider Care Teams.  These Care Teams include your primary Cardiologist (physician) and Advanced Practice Providers (APPs -  Physician Assistants and Nurse Practitioners) who all work together to provide you with the care you need, when you need it.  We recommend signing up for the patient portal called "MyChart".  Sign up information is provided on this After Visit Summary.  MyChart is used to connect with patients for Virtual Visits (Telemedicine).  Patients are able to view lab/test results, encounter notes, upcoming appointments, etc.  Non-urgent messages can be sent to your provider as well.   To learn more about what you can do with MyChart, go to [email or web address].    Your next appointment:   1 year(s) @ [address] [place], [place] [address]   The format for your next appointment:   In Person  Provider:   Jodelle Red, MD     [facility],acting as a scribe for Jodelle Red, MD.,have documented all relevant documentation on the behalf of Jodelle Red, MD,as  directed by  Jodelle Red, MD while in the presence of Jodelle Red, MD.  I, Jodelle Red, MD, have reviewed all documentation for this visit. The documentation on 07/16/20 for the exam, diagnosis, procedures, and orders are all accurate and complete.   Signed, Jodelle Red, MD PhD 07/16/2020 5:56 PM    [facility]

## 2020-07-15 NOTE — Progress Notes (Signed)
Samantha Pearson 60 y.o.   Chief Complaint  Patient presents with   Follow-up    6 months    HISTORY OF PRESENT ILLNESS: This is a 60 y.o. female with history of hypertension here for follow-up. Presently taking olmesartan 40 mg daily and Aldactone 25 mg daily. Struggling with recent loss of a good friend.  Increased emotional stress. Also started working third shift which has not changed her internal clock. Struggling with weight management and constipation. She was referred to medical weight management clinic last December and she was reached by phone but has not been able to schedule an appointment yet. No other complaints or medical concerns today.  HPI   Prior to Admission medications   Medication Sig Start Date End Date Taking? Authorizing Provider  diclofenac (VOLTAREN) 75 MG EC tablet TAKE 1 TABLET BY MOUTH 2 TIMES DAILY AS NEEDED FOR MODERATE PAIN. 03/29/20  Yes Just, Azalee CourseKelsea J, FNP  diclofenac Sodium (VOLTAREN) 1 % GEL Apply 2 g topically 4 (four) times daily. 02/03/20  Yes Just, Azalee CourseKelsea J, FNP  NON FORMULARY Oregano oil   Yes [provider]  olmesartan (BENICAR) 40 MG tablet Take 1 tablet (40 mg total) by mouth daily. 07/28/19  Yes Airi Copado, Eilleen KempfMiguel Jose, MD  sodium chloride (OCEAN) 0.65 % SOLN nasal spray Place 1 spray into both nostrils as needed for congestion. 05/23/20  Yes Roxy HorsemanBrowning, Robert, PA-C  Turmeric POWD 1,950 mg by Does not apply route 3 (three) times daily.   Yes [provider]  benzonatate (TESSALON) 100 MG capsule Take 1 capsule (100 mg total) by mouth 2 (two) times daily as needed for cough. Patient not taking: Reported on 07/15/2020 05/23/20   Roxy HorsemanBrowning, Robert, PA-C  ipratropium (ATROVENT) 0.03 % nasal spray Place 2 sprays into both nostrils every 12 (twelve) hours. Patient not taking: Reported on 07/15/2020 05/23/20   Roxy HorsemanBrowning, Robert, PA-C  spironolactone (ALDACTONE) 25 MG tablet Take 1 tablet (25 mg total) by mouth daily. 07/29/19 10/27/19   Georgina QuintSagardia, Niemah Schwebke Jose, MD    Allergies  Allergen Reactions   Chlorthalidone Other (See Comments)    Severe gout   Doxycycline Hives, Itching and Swelling   Norvasc [Amlodipine Besylate] Swelling    Feet swelling   Septra [Bactrim] Hives, Itching and Swelling   Latex Swelling and Rash    Patient Active Problem List   Diagnosis Date Noted   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   Class 3 severe obesity due to excess calories with serious comorbidity and body mass index (BMI) of 40.0 to 44.9 in adult [facility](HCC) 08/18/2013   Essential hypertension 08/07/2008    Past Medical History:  Diagnosis Date   Allergy    Anxiety    Arthritis    Chest pain 08/03/08   Urgent care visit   Depression    Dyspnea    Edema    Heart murmur    Hypertension    Obesity     Past Surgical History:  Procedure Laterality Date   CESAREAN SECTION  04/16/1980   DILATION AND CURETTAGE OF UTERUS     laparoscopic    Social History   Socioeconomic History   Marital status: Single    Spouse name: Not on file   Number of children: 1   Years of education: Not on file   Highest education level: Not on file  Occupational History   Occupation: 3 jobs    Comment: Education officer, environmentalCleaning and textile  Tobacco Use   Smoking  status: Never   Smokeless tobacco: Never   Tobacco comments:    Nonsmoker  Substance and Sexual Activity   Alcohol use: No    Alcohol/week: 0.0 standard drinks   Drug use: No   Sexual activity: Yes    Birth control/protection: None  Other Topics Concern   Not on file  Social History Narrative   Lives with mother   Marital status: no    Children: 1 daughter   Grandchildren - 2 (granddaughter and grandson)   Lives with: alone   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   Tobacco:  none   Alcohol:  none   Drugs:  none   Exercise:  Active job   Seatbelt: 100%   Guns in home: none      Filed for bankruptcy in 2010       Social Determinants of Health   Financial Resource Strain: Not on file  Food Insecurity: Not on file  Transportation Needs: Not on file  Physical Activity: Not on file  Stress: Not on file  Social Connections: Not on file  Intimate Partner Violence: Not on file    Family History  Problem Relation Age of Onset   Prostate cancer Father    Congestive Heart Failure Father    Hypertension Father    Colon cancer Maternal Uncle    Colon polyps Mother    Breast cancer Mother 72       Left   Hyperlipidemia Mother    Hypertension Mother    Hypertension Sister    Hypertension Brother    Hypertension Sister    Hypertension Sister    Hypertension Sister    Hyperlipidemia Maternal Grandmother    Hypertension Maternal Grandmother    Esophageal cancer Neg Hx    Stomach cancer Neg Hx    Rectal cancer Neg Hx      Review of Systems  Constitutional: Negative.  Negative for chills and fever.  HENT: Negative.  Negative for congestion and sore throat.   Respiratory: Negative.  Negative for cough and shortness of breath.   Cardiovascular: Negative.  Negative for chest pain and palpitations.  Gastrointestinal:  Positive for constipation. Negative for abdominal pain, nausea and vomiting.  Genitourinary: Negative.   Skin: Negative.  Negative for rash.  Neurological: Negative.  Negative for dizziness and headaches.  All other systems reviewed and are negative.  Today's Vitals   07/15/20 0832  BP: (!) 150/80  Pulse: 67  Temp: 98.8 F (37.1 C)  TempSrc: Oral  SpO2: 98%  Weight: 230 lb 3.2 oz (104.4 kg)  Height:  (1.499 m)   Body mass index is 46.49 kg/m. Wt Readings from Last 3 Encounters:  07/15/20 230 lb 3.2 oz (104.4 kg)  05/22/20 223 lb (101.2 kg)  03/29/20 234 lb (106.1 kg)    Physical Exam Vitals reviewed.  Constitutional:      Appearance: She is obese.  HENT:     Head: Normocephalic.  Eyes:     Extraocular Movements: Extraocular movements intact.      Pupils: Pupils are equal, round, and reactive to light.  Cardiovascular:     Rate and Rhythm: Normal rate and regular rhythm.     Pulses: Normal pulses.     Heart sounds: Normal heart sounds.  Pulmonary:     Effort: Pulmonary effort is normal.     Breath sounds: Normal breath sounds.  Musculoskeletal:        General: Normal range  of motion.     Cervical back: Normal range of motion and neck supple.     Right lower leg: No edema.     Left lower leg: No edema.  Skin:    General: Skin is warm and dry.  Neurological:     General: No focal deficit present.     Mental Status: She is alert and oriented to person, place, and time.  Psychiatric:        Mood and Affect: Mood normal.        Behavior: Behavior normal.     ASSESSMENT & PLAN: A total of 30 minutes was spent with the patient and counseling/coordination of care regarding hypertension and cardiovascular risks associated with this condition, review of all medications and changes made including increasing dose of Aldactone, education on nutrition, need to follow-up with medical weight management clinic, review of most recent office visit note, review of most recent blood work results, health maintenance items, stress management, prognosis, documentation and need for follow-up.  Essential hypertension Elevated blood pressure readings at home and in the office. Will increase Aldactone to 50 mg daily.  Continue olmesartan 40 mg daily. Dietary approaches to stop hypertension discussed with patient. Follow-up in 3 months.  Morbid obesity (HCC) Diet and nutrition discussed.  Advised to lower the amount of daily carbohydrate intake. Advised to contact medical weight management clinic to schedule appointment. Evola was seen today for follow-up.  Diagnoses and all orders for this visit:  Essential hypertension -     CBC with Differential/Platelet -     Comprehensive metabolic panel  Morbid obesity (HCC) -     CBC with  Differential/Platelet -     Hemoglobin A1c -     Lipid panel  Patient Instructions  Continue olmesartan 40 mg daily. Increase Aldactone to 50 mg daily.Hypertension, Adult High blood pressure (hypertension) is when the force of blood pumping through the arteries is too strong. The arteries are the blood vessels that carry blood from the heart throughout the body. Hypertension forces the heart to work harder to pump blood and may cause arteries to become narrow or stiff. Untreated or uncontrolled hypertension can cause a heart attack, heart failure, a stroke, kidney disease, and other problems. A blood pressure reading consists of a higher number over a lower number. Ideally, your blood pressure should be below 120/80. The first ("top") number is called the systolic pressure. It is a measure of the pressure in your arteries as your heart beats. The second ("bottom") number is called the diastolic pressure. It is a measure of the pressure in your arteries as the heart relaxes. What are the causes? The exact cause of this condition is not known. There are some conditions that result in or are related to high blood pressure. What increases the risk? Some risk factors for high blood pressure are under your control. The following factors may make you more likely to develop this condition: Smoking. Having type 2 diabetes mellitus, high cholesterol, or both. Not getting enough exercise or physical activity. Being overweight. Having too much fat, sugar, calories, or salt (sodium) in your diet. Drinking too much alcohol. Some risk factors for high blood pressure may be difficult or impossible to change. Some of these factors include: Having chronic kidney disease. Having a family history of high blood pressure. Age. Risk increases with age. Race. You may be at higher risk if you are African American. Gender. Men are at higher risk than women before age 56. After  age 35, women are at higher risk than  men. Having obstructive sleep apnea. Stress. What are the signs or symptoms? High blood pressure may not cause symptoms. Very high blood pressure (hypertensive crisis) may cause: Headache. Anxiety. Shortness of breath. Nosebleed. Nausea and vomiting. Vision changes. Severe chest pain. Seizures. How is this diagnosed? This condition is diagnosed by measuring your blood pressure while you are seated, with your arm resting on a flat surface, your legs uncrossed, and your feet flat on the floor. The cuff of the blood pressure monitor will be placed directly against the skin of your upper arm at the level of your heart. It should be measured at least twice using the same arm. Certain conditions can cause a difference in blood pressure between your right and left arms. Certain factors can cause blood pressure readings to be lower or higher than normal for a short period of time: When your blood pressure is higher when you are in a health care provider's office than when you are at home, this is called white coat hypertension. Most people with this condition do not need medicines. When your blood pressure is higher at home than when you are in a health care provider's office, this is called masked hypertension. Most people with this condition may need medicines to control blood pressure. If you have a high blood pressure reading during one visit or you have normal blood pressure with other risk factors, you may be asked to: Return on a different day to have your blood pressure checked again. Monitor your blood pressure at home for 1 week or longer. If you are diagnosed with hypertension, you may have other blood or imaging tests to help your health care provider understand your overall risk for other conditions. How is this treated? This condition is treated by making healthy lifestyle changes, such as eating healthy foods, exercising more, and reducing your alcohol intake. Your health care provider  may prescribe medicine if lifestyle changes are not enough to get your blood pressure under control, and if: Your systolic blood pressure is above 130. Your diastolic blood pressure is above 80. Your personal target blood pressure may vary depending on your medical conditions, your age, and other factors. Follow these instructions at home: Eating and drinking Eat a diet that is high in fiber and potassium, and low in sodium, added sugar, and fat. An example eating plan is called the DASH (Dietary Approaches to Stop Hypertension) diet. To eat this way: Eat plenty of fresh fruits and vegetables. Try to fill one half of your plate at each meal with fruits and vegetables. Eat whole grains, such as whole-wheat pasta, brown rice, or whole-grain bread. Fill about one fourth of your plate with whole grains. Eat or drink low-fat dairy products, such as skim milk or low-fat yogurt. Avoid fatty cuts of meat, processed or cured meats, and poultry with skin. Fill about one fourth of your plate with lean proteins, such as fish, chicken without skin, beans, eggs, or tofu. Avoid pre-made and processed foods. These tend to be higher in sodium, added sugar, and fat. Reduce your daily sodium intake. Most people with hypertension should eat less than 1,500 mg of sodium a day. Do not drink alcohol if: Your health care provider tells you not to drink. You are pregnant, may be pregnant, or are planning to become pregnant. If you drink alcohol: Limit how much you use to: 0-1 drink a day for women. 0-2 drinks a day for men. Be  aware of how much alcohol is in your drink. In the [place] one drink equals one 12 oz bottle of beer (355 mL), one 5 oz glass of wine (148 mL), or one 1 oz glass of hard liquor (44 mL).   Lifestyle Work with your health care provider to maintain a healthy body weight or to lose weight. Ask what an ideal weight is for you. Get at least 30 minutes of exercise most days of the week. Activities may  include walking, swimming, or biking. Include exercise to strengthen your muscles (resistance exercise), such as Pilates or lifting weights, as part of your weekly exercise routine. Try to do these types of exercises for 30 minutes at least 3 days a week. Do not use any products that contain nicotine or tobacco, such as cigarettes, e-cigarettes, and chewing tobacco. If you need help quitting, ask your health care provider. Monitor your blood pressure at home as told by your health care provider. Keep all follow-up visits as told by your health care provider. This is important.   Medicines Take over-the-counter and prescription medicines only as told by your health care provider. Follow directions carefully. Blood pressure medicines must be taken as prescribed. Do not skip doses of blood pressure medicine. Doing this puts you at risk for problems and can make the medicine less effective. Ask your health care provider about side effects or reactions to medicines that you should watch for. Contact a health care provider if you: Think you are having a reaction to a medicine you are taking. Have headaches that keep coming back (recurring). Feel dizzy. Have swelling in your ankles. Have trouble with your vision. Get help right away if you: Develop a severe headache or confusion. Have unusual weakness or numbness. Feel faint. Have severe pain in your chest or abdomen. Vomit repeatedly. Have trouble breathing. Summary Hypertension is when the force of blood pumping through your arteries is too strong. If this condition is not controlled, it may put you at risk for serious complications. Your personal target blood pressure may vary depending on your medical conditions, your age, and other factors. For most people, a normal blood pressure is less than 120/80. Hypertension is treated with lifestyle changes, medicines, or a combination of both. Lifestyle changes include losing weight, eating a healthy,  low-sodium diet, exercising more, and limiting alcohol. This information is not intended to replace advice given to you by your health care provider. Make sure you discuss any questions you have with your health care provider. Document Revised: 10/03/2017 Document Reviewed: 10/03/2017 Elsevier Patient Education  2021 [employer].   Edwina Barth, MD [facility]

## 2020-07-15 NOTE — Assessment & Plan Note (Signed)
Diet and nutrition discussed.  Advised to lower the amount of daily carbohydrate intake. Advised to contact medical weight management clinic to schedule appointment.

## 2020-07-15 NOTE — Patient Instructions (Signed)
Continue olmesartan 40 mg daily. Increase Aldactone to 50 mg daily.Hypertension, Adult High blood pressure (hypertension) is when the force of blood pumping through the arteries is too strong. The arteries are the blood vessels that carry blood from the heart throughout the body. Hypertension forces the heart to work harder to pump blood and may cause arteries to become narrow or stiff. Untreated or uncontrolled hypertension can cause a heart attack, heart failure, a stroke, kidney disease, and other problems. A blood pressure reading consists of a higher number over a lower number. Ideally, your blood pressure should be below 120/80. The first ("top") number is called the systolic pressure. It is a measure of the pressure in your arteries as your heart beats. The second ("bottom") number is called the diastolic pressure. It is a measure of the pressure in your arteries as the heart relaxes. What are the causes? The exact cause of this condition is not known. There are some conditions that result in or are related to high blood pressure. What increases the risk? Some risk factors for high blood pressure are under your control. The following factors may make you more likely to develop this condition: Smoking. Having type 2 diabetes mellitus, high cholesterol, or both. Not getting enough exercise or physical activity. Being overweight. Having too much fat, sugar, calories, or salt (sodium) in your diet. Drinking too much alcohol. Some risk factors for high blood pressure may be difficult or impossible to change. Some of these factors include: Having chronic kidney disease. Having a family history of high blood pressure. Age. Risk increases with age. Race. You may be at higher risk if you are African American. Gender. Men are at higher risk than women before age 31. After age 2, women are at higher risk than men. Having obstructive sleep apnea. Stress. What are the signs or symptoms? High blood  pressure may not cause symptoms. Very high blood pressure (hypertensive crisis) may cause: Headache. Anxiety. Shortness of breath. Nosebleed. Nausea and vomiting. Vision changes. Severe chest pain. Seizures. How is this diagnosed? This condition is diagnosed by measuring your blood pressure while you are seated, with your arm resting on a flat surface, your legs uncrossed, and your feet flat on the floor. The cuff of the blood pressure monitor will be placed directly against the skin of your upper arm at the level of your heart. It should be measured at least twice using the same arm. Certain conditions can cause a difference in blood pressure between your right and left arms. Certain factors can cause blood pressure readings to be lower or higher than normal for a short period of time: When your blood pressure is higher when you are in a health care provider's office than when you are at home, this is called white coat hypertension. Most people with this condition do not need medicines. When your blood pressure is higher at home than when you are in a health care provider's office, this is called masked hypertension. Most people with this condition may need medicines to control blood pressure. If you have a high blood pressure reading during one visit or you have normal blood pressure with other risk factors, you may be asked to: Return on a different day to have your blood pressure checked again. Monitor your blood pressure at home for 1 week or longer. If you are diagnosed with hypertension, you may have other blood or imaging tests to help your health care provider understand your overall risk for other conditions.  How is this treated? This condition is treated by making healthy lifestyle changes, such as eating healthy foods, exercising more, and reducing your alcohol intake. Your health care provider may prescribe medicine if lifestyle changes are not enough to get your blood pressure under  control, and if: Your systolic blood pressure is above 130. Your diastolic blood pressure is above 80. Your personal target blood pressure may vary depending on your medical conditions, your age, and other factors. Follow these instructions at home: Eating and drinking Eat a diet that is high in fiber and potassium, and low in sodium, added sugar, and fat. An example eating plan is called the DASH (Dietary Approaches to Stop Hypertension) diet. To eat this way: Eat plenty of fresh fruits and vegetables. Try to fill one half of your plate at each meal with fruits and vegetables. Eat whole grains, such as whole-wheat pasta, brown rice, or whole-grain bread. Fill about one fourth of your plate with whole grains. Eat or drink low-fat dairy products, such as skim milk or low-fat yogurt. Avoid fatty cuts of meat, processed or cured meats, and poultry with skin. Fill about one fourth of your plate with lean proteins, such as fish, chicken without skin, beans, eggs, or tofu. Avoid pre-made and processed foods. These tend to be higher in sodium, added sugar, and fat. Reduce your daily sodium intake. Most people with hypertension should eat less than 1,500 mg of sodium a day. Do not drink alcohol if: Your health care provider tells you not to drink. You are pregnant, may be pregnant, or are planning to become pregnant. If you drink alcohol: Limit how much you use to: 0-1 drink a day for women. 0-2 drinks a day for men. Be aware of how much alcohol is in your drink. In the [place] one drink equals one 12 oz bottle of beer (355 mL), one 5 oz glass of wine (148 mL), or one 1 oz glass of hard liquor (44 mL).   Lifestyle Work with your health care provider to maintain a healthy body weight or to lose weight. Ask what an ideal weight is for you. Get at least 30 minutes of exercise most days of the week. Activities may include walking, swimming, or biking. Include exercise to strengthen your muscles  (resistance exercise), such as Pilates or lifting weights, as part of your weekly exercise routine. Try to do these types of exercises for 30 minutes at least 3 days a week. Do not use any products that contain nicotine or tobacco, such as cigarettes, e-cigarettes, and chewing tobacco. If you need help quitting, ask your health care provider. Monitor your blood pressure at home as told by your health care provider. Keep all follow-up visits as told by your health care provider. This is important.   Medicines Take over-the-counter and prescription medicines only as told by your health care provider. Follow directions carefully. Blood pressure medicines must be taken as prescribed. Do not skip doses of blood pressure medicine. Doing this puts you at risk for problems and can make the medicine less effective. Ask your health care provider about side effects or reactions to medicines that you should watch for. Contact a health care provider if you: Think you are having a reaction to a medicine you are taking. Have headaches that keep coming back (recurring). Feel dizzy. Have swelling in your ankles. Have trouble with your vision. Get help right away if you: Develop a severe headache or confusion. Have unusual weakness or numbness. Feel faint.  Have severe pain in your chest or abdomen. Vomit repeatedly. Have trouble breathing. Summary Hypertension is when the force of blood pumping through your arteries is too strong. If this condition is not controlled, it may put you at risk for serious complications. Your personal target blood pressure may vary depending on your medical conditions, your age, and other factors. For most people, a normal blood pressure is less than 120/80. Hypertension is treated with lifestyle changes, medicines, or a combination of both. Lifestyle changes include losing weight, eating a healthy, low-sodium diet, exercising more, and limiting alcohol. This information is not  intended to replace advice given to you by your health care provider. Make sure you discuss any questions you have with your health care provider. Document Revised: 10/03/2017 Document Reviewed: 10/03/2017 Elsevier Patient Education  2021 [employer].

## 2020-07-16 ENCOUNTER — Encounter: Payer: Self-pay | Admitting: Cardiology

## 2020-07-16 ENCOUNTER — Ambulatory Visit (INDEPENDENT_AMBULATORY_CARE_PROVIDER_SITE_OTHER): Payer: BC Managed Care – PPO | Admitting: Cardiology

## 2020-07-16 VITALS — BP 138/70 | HR 82 | Ht 59.0 in | Wt 227.0 lb

## 2020-07-16 DIAGNOSIS — I10 Essential (primary) hypertension: Secondary | ICD-10-CM

## 2020-07-16 DIAGNOSIS — Z6841 Body Mass Index (BMI) 40.0 and over, adult: Secondary | ICD-10-CM

## 2020-07-16 DIAGNOSIS — Z7189 Other specified counseling: Secondary | ICD-10-CM

## 2020-07-16 NOTE — Patient Instructions (Signed)
Medication Instructions:  Your Physician recommend you continue on your current medication as directed.    *If you need a refill on your cardiac medications before your next appointment, please call your pharmacy*   Lab Work: None ordered today   Testing/Procedures: None ordered today   Follow-Up: At [facility], you and your health needs are our priority.  As part of our continuing mission to provide you with exceptional heart care, we have created designated Provider Care Teams.  These Care Teams include your primary Cardiologist (physician) and Advanced Practice Providers (APPs -  Physician Assistants and Nurse Practitioners) who all work together to provide you with the care you need, when you need it.  We recommend signing up for the patient portal called "MyChart".  Sign up information is provided on this After Visit Summary.  MyChart is used to connect with patients for Virtual Visits (Telemedicine).  Patients are able to view lab/test results, encounter notes, upcoming appointments, etc.  Non-urgent messages can be sent to your provider as well.   To learn more about what you can do with MyChart, go to [email or web address].    Your next appointment:   1 year(s) @ [address] [place], [place] [address]   The format for your next appointment:   In Person  Provider:   Bridgette Christopher, MD     

## 2020-07-17 ENCOUNTER — Other Ambulatory Visit: Payer: Self-pay | Admitting: Emergency Medicine

## 2020-09-20 ENCOUNTER — Other Ambulatory Visit: Payer: Self-pay | Admitting: Emergency Medicine

## 2020-09-20 DIAGNOSIS — I10 Essential (primary) hypertension: Secondary | ICD-10-CM

## 2020-10-20 ENCOUNTER — Other Ambulatory Visit: Payer: Self-pay

## 2020-10-20 ENCOUNTER — Encounter: Payer: Self-pay | Admitting: Emergency Medicine

## 2020-10-20 ENCOUNTER — Ambulatory Visit: Payer: BC Managed Care – PPO | Admitting: Emergency Medicine

## 2020-10-20 VITALS — BP 122/70 | HR 74 | Temp 98.1°F | Ht 59.0 in | Wt 227.0 lb

## 2020-10-20 DIAGNOSIS — E785 Hyperlipidemia, unspecified: Secondary | ICD-10-CM

## 2020-10-20 DIAGNOSIS — I10 Essential (primary) hypertension: Secondary | ICD-10-CM | POA: Diagnosis not present

## 2020-10-20 DIAGNOSIS — Z01419 Encounter for gynecological examination (general) (routine) without abnormal findings: Secondary | ICD-10-CM | POA: Diagnosis not present

## 2020-10-20 DIAGNOSIS — Z124 Encounter for screening for malignant neoplasm of cervix: Secondary | ICD-10-CM | POA: Diagnosis not present

## 2020-10-20 DIAGNOSIS — N1831 Chronic kidney disease, stage 3a: Secondary | ICD-10-CM | POA: Diagnosis not present

## 2020-10-20 DIAGNOSIS — Z6841 Body Mass Index (BMI) 40.0 and over, adult: Secondary | ICD-10-CM | POA: Diagnosis not present

## 2020-10-20 MED ORDER — ROSUVASTATIN CALCIUM 10 MG PO TABS: 10.0000 mg | ORAL_TABLET | Freq: Every day | ORAL | 3 refills | Status: DC

## 2020-10-20 NOTE — Patient Instructions (Signed)
Health Maintenance, Female Adopting a healthy lifestyle and getting preventive care are important in promoting health and wellness. Ask your health care provider about: The right schedule for you to have regular tests and exams. Things you can do on your own to prevent diseases and keep yourself healthy. What should I know about diet, weight, and exercise? Eat a healthy diet  Eat a diet that includes plenty of vegetables, fruits, low-fat dairy products, and lean protein. Do not eat a lot of foods that are high in solid fats, added sugars, or sodium. Maintain a healthy weight Body mass index (BMI) is used to identify weight problems. It estimates body fat based on height and weight. Your health care provider can help determine your BMI and help you achieve or maintain a healthy weight. Get regular exercise Get regular exercise. This is one of the most important things you can do for your health. Most adults should: Exercise for at least 150 minutes each week. The exercise should increase your heart rate and make you sweat (moderate-intensity exercise). Do strengthening exercises at least twice a week. This is in addition to the moderate-intensity exercise. Spend less time sitting. Even light physical activity can be beneficial. Watch cholesterol and blood lipids Have your blood tested for lipids and cholesterol at 59 years of age, then have this test every 5 years. Have your cholesterol levels checked more often if: Your lipid or cholesterol levels are high. You are older than 59 years of age. You are at high risk for heart disease. What should I know about cancer screening? Depending on your health history and family history, you may need to have cancer screening at various ages. This may include screening for: Breast cancer. Cervical cancer. Colorectal cancer. Skin cancer. Lung cancer. What should I know about heart disease, diabetes, and high blood pressure? Blood pressure and heart  disease High blood pressure causes heart disease and increases the risk of stroke. This is more likely to develop in people who have high blood pressure readings, are of African descent, or are overweight. Have your blood pressure checked: Every 3-5 years if you are 18-39 years of age. Every year if you are 40 years old or older. Diabetes Have regular diabetes screenings. This checks your fasting blood sugar level. Have the screening done: Once every three years after age 40 if you are at a normal weight and have a low risk for diabetes. More often and at a younger age if you are overweight or have a high risk for diabetes. What should I know about preventing infection? Hepatitis B If you have a higher risk for hepatitis B, you should be screened for this virus. Talk with your health care provider to find out if you are at risk for hepatitis B infection. Hepatitis C Testing is recommended for: Everyone born from 1945 through 1965. Anyone with known risk factors for hepatitis C. Sexually transmitted infections (STIs) Get screened for STIs, including gonorrhea and chlamydia, if: You are sexually active and are younger than 59 years of age. You are older than 59 years of age and your health care provider tells you that you are at risk for this type of infection. Your sexual activity has changed since you were last screened, and you are at increased risk for chlamydia or gonorrhea. Ask your health care provider if you are at risk. Ask your health care provider about whether you are at high risk for HIV. Your health care provider may recommend a prescription medicine   to help prevent HIV infection. If you choose to take medicine to prevent HIV, you should first get tested for HIV. You should then be tested every 3 months for as long as you are taking the medicine. Pregnancy If you are about to stop having your period (premenopausal) and you may become pregnant, seek counseling before you get  pregnant. Take 400 to 800 micrograms (mcg) of folic acid every day if you become pregnant. Ask for birth control (contraception) if you want to prevent pregnancy. Osteoporosis and menopause Osteoporosis is a disease in which the bones lose minerals and strength with aging. This can result in bone fractures. If you are 65 years old or older, or if you are at risk for osteoporosis and fractures, ask your health care provider if you should: Be screened for bone loss. Take a calcium or vitamin D supplement to lower your risk of fractures. Be given hormone replacement therapy (HRT) to treat symptoms of menopause. Follow these instructions at home: Lifestyle Do not use any products that contain nicotine or tobacco, such as cigarettes, e-cigarettes, and chewing tobacco. If you need help quitting, ask your health care provider. Do not use street drugs. Do not share needles. Ask your health care provider for help if you need support or information about quitting drugs. Alcohol use Do not drink alcohol if: Your health care provider tells you not to drink. You are pregnant, may be pregnant, or are planning to become pregnant. If you drink alcohol: Limit how much you use to 0-1 drink a day. Limit intake if you are breastfeeding. Be aware of how much alcohol is in your drink. In the [place] one drink equals one 12 oz bottle of beer (355 mL), one 5 oz glass of wine (148 mL), or one 1 oz glass of hard liquor (44 mL). General instructions Schedule regular health, dental, and eye exams. Stay current with your vaccines. Tell your health care provider if: You often feel depressed. You have ever been abused or do not feel safe at home. Summary Adopting a healthy lifestyle and getting preventive care are important in promoting health and wellness. Follow your health care provider's instructions about healthy diet, exercising, and getting tested or screened for diseases. Follow your health care provider's  instructions on monitoring your cholesterol and blood pressure. This information is not intended to replace advice given to you by your health care provider. Make sure you discuss any questions you have with your health care provider. Document Revised: 04/02/2020 Document Reviewed: 01/16/2018 Elsevier Patient Education  2022 [employer]  

## 2020-10-20 NOTE — Assessment & Plan Note (Signed)
Diet and nutrition discussed.  Start Crestor 10 mg daily. The 10-year ASCVD risk score (Arnett DK, et al., 2019) is: 3.6%   Values used to calculate the score:     Age: 60 years     Sex: Female     Is Non-Hispanic African American: No     Diabetic: No     Tobacco smoker: No     Systolic Blood Pressure: 122 mmHg     Is BP treated: Yes     HDL Cholesterol: 56.6 mg/dL     Total Cholesterol: 206 mg/dL

## 2020-10-20 NOTE — Assessment & Plan Note (Signed)
Well-controlled hypertension.  Continue olmesartan 40 mg and Aldactone 25 mg daily. BP Readings from Last 3 Encounters:  10/20/20 122/70  07/16/20 138/70  07/15/20 (!) 150/80

## 2020-10-20 NOTE — Progress Notes (Signed)
Samantha Pearson 60 y.o.   Chief Complaint  Patient presents with   Hypertension    3 month follow up   Last office visit on 07/15/2020 assessment and plan as follows: Essential hypertension Elevated blood pressure readings at home and in the office. Will increase Aldactone to 50 mg daily.  Continue olmesartan 40 mg daily. Dietary approaches to stop hypertension discussed with patient. Follow-up in 3 months.   Morbid obesity (HCC) Diet and nutrition discussed.  Advised to lower the amount of daily carbohydrate intake. Advised to contact medical weight management clinic to schedule appointment. Samantha Pearson was seen today for follow-up. HISTORY OF PRESENT ILLNESS: This is a 60 y.o. female with history of hypertension here for follow-up. Last blood work results showed increased cholesterol and decreased GFR. Lab Results  Component Value Date   CREATININE 1.20 07/15/2020   BUN 32 (H) 07/15/2020   NA 138 07/15/2020   K 4.6 07/15/2020   CL 101 07/15/2020   CO2 28 07/15/2020  Up-to-date with colonoscopy.  Scheduled for mammogram next month. Needs another prescription for rosuvastatin. No other complaints or medical concerns today.  BP Readings from Last 3 Encounters:  07/16/20 138/70  07/15/20 (!) 150/80  05/23/20 (!) 139/110    Hypertension Pertinent negatives include no chest pain, headaches, palpitations or shortness of breath.    Prior to Admission medications   Medication Sig Start Date End Date Taking? Authorizing Provider  diclofenac Sodium (VOLTAREN) 1 % GEL Apply 2 g topically 4 (four) times daily. 02/03/20   Just, Azalee Course, FNP  ipratropium (ATROVENT) 0.03 % nasal spray Place 2 sprays into both nostrils every 12 (twelve) hours. 05/23/20   Roxy Horseman, PA-C  NON FORMULARY Oregano oil    [provider]  olmesartan (BENICAR) 40 MG tablet TAKE 1 TABLET BY MOUTH EVERY DAY 09/20/20   Georgina Quint, MD  sodium chloride (OCEAN) 0.65 % SOLN nasal spray Place 1  spray into both nostrils as needed for congestion. 05/23/20   Roxy Horseman, PA-C  spironolactone (ALDACTONE) 25 MG tablet TAKE 1 TABLET BY MOUTH EVERY DAY 07/18/20   Georgina Quint, MD  Turmeric POWD 1,950 mg by Does not apply route 3 (three) times daily.    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   Morbid obesity (HCC) 08/18/2013   Essential hypertension 08/07/2008    Past Medical History:  Diagnosis Date   Allergy    Anxiety    Arthritis    Chest pain 08/03/08   Urgent care visit   Depression    Dyspnea    Edema    Heart murmur    Hypertension    Obesity     Past Surgical History:  Procedure Laterality Date   CESAREAN SECTION  04/16/1980   DILATION AND CURETTAGE OF UTERUS     laparoscopic    Social History   Socioeconomic History   Marital status: Single    Spouse name: Not on file   Number of children: 1   Years of education: Not on file   Highest education level: Not on file  Occupational History   Occupation: 3 jobs    Comment: International aid/development worker  Tobacco Use   Smoking status: Never   Smokeless tobacco: Never   Tobacco comments:    Nonsmoker  Substance and Sexual Activity   Alcohol use: No    Alcohol/week: 0.0 standard drinks   Drug use: No   Sexual activity: Yes    Birth control/protection: None  Other Topics Concern   Not on file  Social History Narrative   Lives with mother   Marital status: no    Children: 1 daughter   Grandchildren - 2 (granddaughter and grandson)   Lives with: alone   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    Tobacco:  none   Alcohol:  none   Drugs:  none   Exercise:  Active job   Seatbelt: 100%   Guns in home: none      Filed for bankruptcy in 2010      Social Determinants of Health   Financial Resource Strain: Not on file  Food Insecurity: Not on file  Transportation Needs: Not on file  Physical Activity: Not on file  Stress: Not on file  Social Connections: Not on file  Intimate Partner Violence: Not on file    Family History  Problem Relation Age of Onset   Prostate cancer Father    Congestive Heart Failure Father    Hypertension Father    Colon cancer Maternal Uncle    Colon polyps Mother    Breast cancer Mother 77       Left   Hyperlipidemia Mother    Hypertension Mother    Hypertension Sister    Hypertension Brother    Hypertension Sister    Hypertension Sister    Hypertension Sister    Hyperlipidemia Maternal Grandmother    Hypertension Maternal Grandmother    Esophageal cancer Neg Hx    Stomach cancer Neg Hx    Rectal cancer Neg Hx      Review of Systems  Constitutional: Negative.  Negative for chills and fever.  HENT: Negative.  Negative for congestion and sore throat.   Respiratory: Negative.  Negative for cough and shortness of breath.   Cardiovascular: Negative.  Negative for chest pain and palpitations.  Gastrointestinal:  Negative for abdominal pain, diarrhea, nausea and vomiting.  Genitourinary: Negative.  Negative for dysuria.  Skin: Negative.  Negative for rash.  Neurological:  Negative for dizziness and headaches.  All other systems reviewed and are negative.  Today's Vitals   10/20/20 0841  BP: 122/70  Pulse: 74  Temp: 98.1 F (36.7 C)  TempSrc: Oral  SpO2: 96%  Weight: 227 lb (103 kg)  Height:  (1.499 m)   Body mass index is 45.85 kg/m. Wt Readings from Last 3 Encounters:  10/20/20 227 lb (103 kg)  07/16/20 227 lb (103 kg)  07/15/20 230 lb 3.2 oz (104.4 kg)    Physical Exam Vitals reviewed.  Constitutional:       Appearance: Normal appearance.  HENT:     Head: Normocephalic.  Eyes:     Extraocular Movements: Extraocular movements intact.     Conjunctiva/sclera: Conjunctivae normal.     Pupils: Pupils are equal, round, and reactive to light.  Cardiovascular:     Rate and Rhythm: Normal rate and regular rhythm.     Pulses: Normal pulses.     Heart sounds: Normal heart sounds.  Pulmonary:     Effort: Pulmonary effort  is normal.     Breath sounds: Normal breath sounds.  Musculoskeletal:     Cervical back: Normal range of motion and neck supple.     Right lower leg: No edema.     Left lower leg: No edema.  Skin:    General: Skin is warm and dry.     Capillary Refill: Capillary refill takes less than 2 seconds.  Neurological:     General: No focal deficit present.     Mental Status: She is alert and oriented to person, place, and time.  Psychiatric:        Mood and Affect: Mood normal.        Behavior: Behavior normal.     ASSESSMENT & PLAN: Essential hypertension Well-controlled hypertension.  Continue olmesartan 40 mg and Aldactone 25 mg daily. BP Readings from Last 3 Encounters:  10/20/20 122/70  07/16/20 138/70  07/15/20 (!) 150/80     Dyslipidemia Diet and nutrition discussed.  Start Crestor 10 mg daily. The 10-year ASCVD risk score (Arnett DK, et al., 2019) is: 3.6%   Values used to calculate the score:     Age: 67 years     Sex: Female     Is Non-Hispanic African American: No     Diabetic: No     Tobacco smoker: No     Systolic Blood Pressure: 122 mmHg     Is BP treated: Yes     HDL Cholesterol: 56.6 mg/dL     Total Cholesterol: 206 mg/dL   Stage 3a chronic kidney disease (HCC) Advised to stay well-hydrated and avoid NSAIDs.  Samantha Pearson was seen today for hypertension.  Diagnoses and all orders for this visit:  Essential hypertension  Dyslipidemia -     rosuvastatin (CRESTOR) 10 MG tablet; Take 1 tablet (10 mg total) by mouth daily.  Stage 3a chronic kidney  disease [facility])  Patient Instructions  Health Maintenance, Female Adopting a healthy lifestyle and getting preventive care are important in promoting health and wellness. Ask your health care provider about: The right schedule for you to have regular tests and exams. Things you can do on your own to prevent diseases and keep yourself healthy. What should I know about diet, weight, and exercise? Eat a healthy diet  Eat a diet that includes plenty of vegetables, fruits, low-fat dairy products, and lean protein. Do not eat a lot of foods that are high in solid fats, added sugars, or sodium. Maintain a healthy weight Body mass index (BMI) is used to identify weight problems. It estimates body fat based on height and weight. Your health care provider can help determine your BMI and help you achieve or maintain a healthy weight. Get regular exercise Get regular exercise. This is one of the most important things you can do for your health. Most adults should: Exercise for at least 150 minutes each week. The exercise should increase your heart rate and make you sweat (moderate-intensity exercise). Do strengthening exercises at least twice a week. This is in addition to the moderate-intensity exercise. Spend less time sitting. Even light physical activity can be beneficial. Watch cholesterol and blood lipids Have your blood tested for lipids and cholesterol at 59 years of age, then have this test every 5 years. Have your cholesterol levels checked more often if: Your lipid or cholesterol levels are high. You are older than 59 years of age. You are at high risk for heart disease. What should I know about cancer screening? Depending on your health history  and family history, you may need to have cancer screening at various ages. This may include screening for: Breast cancer. Cervical cancer. Colorectal cancer. Skin cancer. Lung cancer. What should I know about heart disease, diabetes, and high  blood pressure? Blood pressure and heart disease High blood pressure causes heart disease and increases the risk of stroke. This is more likely to develop in people who have high blood pressure readings, are of African descent, or are overweight. Have your blood pressure checked: Every 3-5 years if you are 19-66 years of age. Every year if you are 67 years old or older. Diabetes Have regular diabetes screenings. This checks your fasting blood sugar level. Have the screening done: Once every three years after age 60 if you are at a normal weight and have a low risk for diabetes. More often and at a younger age if you are overweight or have a high risk for diabetes. What should I know about preventing infection? Hepatitis B If you have a higher risk for hepatitis B, you should be screened for this virus. Talk with your health care provider to find out if you are at risk for hepatitis B infection. Hepatitis C Testing is recommended for: Everyone born from [birth year] through 1964. Anyone with known risk factors for hepatitis C. Sexually transmitted infections (STIs) Get screened for STIs, including gonorrhea and chlamydia, if: You are sexually active and are younger than 59 years of age. You are older than 59 years of age and your health care provider tells you that you are at risk for this type of infection. Your sexual activity has changed since you were last screened, and you are at increased risk for chlamydia or gonorrhea. Ask your health care provider if you are at risk. Ask your health care provider about whether you are at high risk for HIV. Your health care provider may recommend a prescription medicine to help prevent HIV infection. If you choose to take medicine to prevent HIV, you should first get tested for HIV. You should then be tested every 3 months for as long as you are taking the medicine. Pregnancy If you are about to stop having your period (premenopausal) and you may become  pregnant, seek counseling before you get pregnant. Take 400 to 800 micrograms (mcg) of folic acid every day if you become pregnant. Ask for birth control (contraception) if you want to prevent pregnancy. Osteoporosis and menopause Osteoporosis is a disease in which the bones lose minerals and strength with aging. This can result in bone fractures. If you are 29 years old or older, or if you are at risk for osteoporosis and fractures, ask your health care provider if you should: Be screened for bone loss. Take a calcium or vitamin D supplement to lower your risk of fractures. Be given hormone replacement therapy (HRT) to treat symptoms of menopause. Follow these instructions at home: Lifestyle Do not use any products that contain nicotine or tobacco, such as cigarettes, e-cigarettes, and chewing tobacco. If you need help quitting, ask your health care provider. Do not use street drugs. Do not share needles. Ask your health care provider for help if you need support or information about quitting drugs. Alcohol use Do not drink alcohol if: Your health care provider tells you not to drink. You are pregnant, may be pregnant, or are planning to become pregnant. If you drink alcohol: Limit how much you use to 0-1 drink a day. Limit intake if you are breastfeeding. Be aware of how  much alcohol is in your drink. In the [place] one drink equals one 12 oz bottle of beer (355 mL), one 5 oz glass of wine (148 mL), or one 1 oz glass of hard liquor (44 mL). General instructions Schedule regular health, dental, and eye exams. Stay current with your vaccines. Tell your health care provider if: You often feel depressed. You have ever been abused or do not feel safe at home. Summary Adopting a healthy lifestyle and getting preventive care are important in promoting health and wellness. Follow your health care provider's instructions about healthy diet, exercising, and getting tested or screened for  diseases. Follow your health care provider's instructions on monitoring your cholesterol and blood pressure. This information is not intended to replace advice given to you by your health care provider. Make sure you discuss any questions you have with your health care provider. Document Revised: 04/02/2020 Document Reviewed: 01/16/2018 Elsevier Patient Education  2022 [employer]    Edwina Barth, MD [facility]

## 2020-10-20 NOTE — Assessment & Plan Note (Signed)
Advised to stay well-hydrated and avoid NSAIDs. ?

## 2020-12-20 DIAGNOSIS — M109 Gout, unspecified: Secondary | ICD-10-CM | POA: Diagnosis not present

## 2020-12-20 DIAGNOSIS — M79672 Pain in left foot: Secondary | ICD-10-CM | POA: Diagnosis not present

## 2020-12-20 DIAGNOSIS — Z1231 Encounter for screening mammogram for malignant neoplasm of breast: Secondary | ICD-10-CM | POA: Diagnosis not present

## 2020-12-20 DIAGNOSIS — M1990 Unspecified osteoarthritis, unspecified site: Secondary | ICD-10-CM | POA: Diagnosis not present

## 2020-12-20 DIAGNOSIS — I10 Essential (primary) hypertension: Secondary | ICD-10-CM | POA: Diagnosis not present

## 2021-01-20 ENCOUNTER — Ambulatory Visit (INDEPENDENT_AMBULATORY_CARE_PROVIDER_SITE_OTHER): Payer: BC Managed Care – PPO | Admitting: Emergency Medicine

## 2021-01-20 ENCOUNTER — Encounter: Payer: Self-pay | Admitting: Emergency Medicine

## 2021-01-20 ENCOUNTER — Other Ambulatory Visit: Payer: Self-pay

## 2021-01-20 VITALS — BP 136/70 | HR 75 | Temp 98.2°F | Ht 59.0 in | Wt 228.0 lb

## 2021-01-20 DIAGNOSIS — E785 Hyperlipidemia, unspecified: Secondary | ICD-10-CM | POA: Diagnosis not present

## 2021-01-20 DIAGNOSIS — Z130 Encounter for screening for diseases of the blood and blood-forming organs and certain disorders involving the immune mechanism: Secondary | ICD-10-CM

## 2021-01-20 DIAGNOSIS — I10 Essential (primary) hypertension: Secondary | ICD-10-CM | POA: Diagnosis not present

## 2021-01-20 DIAGNOSIS — Z13228 Encounter for screening for other metabolic disorders: Secondary | ICD-10-CM

## 2021-01-20 DIAGNOSIS — Z0000 Encounter for general adult medical examination without abnormal findings: Secondary | ICD-10-CM

## 2021-01-20 DIAGNOSIS — Z1329 Encounter for screening for other suspected endocrine disorder: Secondary | ICD-10-CM

## 2021-01-20 DIAGNOSIS — Z8739 Personal history of other diseases of the musculoskeletal system and connective tissue: Secondary | ICD-10-CM | POA: Diagnosis not present

## 2021-01-20 DIAGNOSIS — R7303 Prediabetes: Secondary | ICD-10-CM

## 2021-01-20 DIAGNOSIS — N1831 Chronic kidney disease, stage 3a: Secondary | ICD-10-CM

## 2021-01-20 LAB — COMPREHENSIVE METABOLIC PANEL
ALT: 26 U/L (ref 0–35)
AST: 27 U/L (ref 0–37)
Albumin: 4.7 g/dL (ref 3.5–5.2)
Alkaline Phosphatase: 85 U/L (ref 39–117)
BUN: 15 mg/dL (ref 6–23)
CO2: 31 mEq/L (ref 19–32)
Calcium: 10.5 mg/dL (ref 8.4–10.5)
Chloride: 101 mEq/L (ref 96–112)
Creatinine, Ser: 0.92 mg/dL (ref 0.40–1.20)
GFR: 68.08 mL/min (ref >60.00)
Glucose, Bld: 94 mg/dL (ref 70–99)
Potassium: 4.2 mEq/L (ref 3.5–5.1)
Sodium: 139 mEq/L (ref 135–145)
Total Bilirubin: 1.1 mg/dL (ref 0.2–1.2)
Total Protein: 8.3 g/dL (ref 6.0–8.3)

## 2021-01-20 LAB — LIPID PANEL
Cholesterol: 150 mg/dL (ref 0–200)
HDL: 54.6 mg/dL (ref >39.00)
LDL Cholesterol: 73 mg/dL (ref 0–99)
NonHDL: 95.88
Total CHOL/HDL Ratio: 3
Triglycerides: 115 mg/dL (ref 0.0–149.0)
VLDL: 23 mg/dL (ref 0.0–40.0)

## 2021-01-20 LAB — TSH: TSH: 0.98 u[IU]/mL (ref 0.35–5.50)

## 2021-01-20 LAB — URIC ACID: Uric Acid, Serum: 5.5 mg/dL (ref 2.4–7.0)

## 2021-01-20 LAB — HEMOGLOBIN A1C: Hgb A1c MFr Bld: 5.8 % (ref 4.6–6.5)

## 2021-01-20 MED ORDER — DAPAGLIFLOZIN PROPANEDIOL 10 MG PO TABS: 10.0000 mg | ORAL_TABLET | Freq: Every day | ORAL | 0 refills | Status: AC

## 2021-01-20 MED ORDER — SPIRONOLACTONE 50 MG PO TABS: 50.0000 mg | ORAL_TABLET | Freq: Every day | ORAL | 3 refills | Status: DC

## 2021-01-20 NOTE — Progress Notes (Signed)
[name] 60 y.o.   Chief Complaint  Patient presents with   Annual Exam    HISTORY OF PRESENT ILLNESS: This is a 60 y.o. female here for annual exam. Has history of hypertension on olmesartan and Aldactone.  Doing well. History of dyslipidemia on rosuvastatin 10 mg daily. History of gout on allopurinol 100 mg daily History of CKD type IIIa Sees gynecologist on a regular basis.  Up-to-date with Pap smears and mammogram Up-to-date with colonoscopy Sees rheumatologist on a regular basis. Has no complaints or medical concerns today. BP Readings from Last 3 Encounters:  10/20/20 122/70  07/16/20 138/70  07/15/20 (!) 150/80   Wt Readings from Last 3 Encounters:  10/20/20 227 lb (103 kg)  07/16/20 227 lb (103 kg)  07/15/20 230 lb 3.2 oz (104.4 kg)   Lab Results  Component Value Date   HGBA1C 5.7 07/15/2020      HPI   Prior to Admission medications   Medication Sig Start Date End Date Taking? Authorizing Provider  diclofenac Sodium (VOLTAREN) 1 % GEL Apply 2 g topically 4 (four) times daily. 02/03/20   Just, Azalee Course, FNP  ipratropium (ATROVENT) 0.03 % nasal spray Place 2 sprays into both nostrils every 12 (twelve) hours. Patient not taking: Reported on 10/20/2020 05/23/20   Roxy Horseman, PA-C  NON FORMULARY Oregano oil    [provider]  olmesartan (BENICAR) 40 MG tablet TAKE 1 TABLET BY MOUTH EVERY DAY 09/20/20   Georgina Quint, MD  rosuvastatin (CRESTOR) 10 MG tablet Take 1 tablet (10 mg total) by mouth daily. 10/20/20   Georgina Quint, MD  sodium chloride (OCEAN) 0.65 % SOLN nasal spray Place 1 spray into both nostrils as needed for congestion. 05/23/20   Roxy Horseman, PA-C  spironolactone (ALDACTONE) 25 MG tablet TAKE 1 TABLET BY MOUTH EVERY DAY 07/18/20   Georgina Quint, MD  Turmeric POWD 1,950 mg by Does not apply route 3 (three) times daily.    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   Morbid obesity (HCC) 08/18/2013   Essential hypertension 08/07/2008    Past Medical History:  Diagnosis Date   Allergy    Anxiety    Arthritis    Chest pain 08/03/08   Urgent care visit   Depression    Dyspnea    Edema    Heart murmur    Hypertension    Obesity     Past Surgical History:  Procedure Laterality Date   CESAREAN SECTION  04/16/1980   DILATION AND CURETTAGE OF UTERUS     laparoscopic    Social History   Socioeconomic History   Marital status: Single    Spouse name: Not on file   Number of children: 1   Years of education: Not on file   Highest education level: Not on file  Occupational History   Occupation: 3 jobs    Comment: Education officer, environmental and textile  Tobacco Use   Smoking status: Never   Smokeless tobacco: Never  Tobacco comments:    Nonsmoker  Substance and Sexual Activity   Alcohol use: No    Alcohol/week: 0.0 standard drinks   Drug use: No   Sexual activity: Yes    Birth control/protection: None  Other Topics Concern   Not on file  Social History Narrative   Lives with mother   Marital status: no    Children: 1 daughter   Grandchildren - 2 (granddaughter and grandson)   Lives with: alone   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   Tobacco:  none   Alcohol:  none   Drugs:  none   Exercise:  Active job   Seatbelt: 100%   Guns in home: none      Filed for bankruptcy in 2010      Social Determinants of Health   Financial Resource Strain: Not on file  Food Insecurity: Not on file   Transportation Needs: Not on file  Physical Activity: Not on file  Stress: Not on file  Social Connections: Not on file  Intimate Partner Violence: Not on file    Family History  Problem Relation Age of Onset   Prostate cancer Father    Congestive Heart Failure Father    Hypertension Father    Colon cancer Maternal Uncle    Colon polyps Mother    Breast cancer Mother 83       Left   Hyperlipidemia Mother    Hypertension Mother    Hypertension Sister    Hypertension Brother    Hypertension Sister    Hypertension Sister    Hypertension Sister    Hyperlipidemia Maternal Grandmother    Hypertension Maternal Grandmother    Esophageal cancer Neg Hx    Stomach cancer Neg Hx    Rectal cancer Neg Hx      Review of Systems  Constitutional: Negative.  Negative for chills and fever.  HENT: Negative.  Negative for congestion and sore throat.   Respiratory: Negative.  Negative for cough and shortness of breath.   Cardiovascular: Negative.  Negative for chest pain.  Gastrointestinal: Negative.  Negative for abdominal pain, diarrhea, nausea and vomiting.  Genitourinary: Negative.  Negative for dysuria.  Skin: Negative.  Negative for rash.  Neurological: Negative.  Negative for dizziness and headaches.  All other systems reviewed and are negative.  Today's Vitals   01/20/21 1443  BP: 136/70  Pulse: 75  Temp: 98.2 F (36.8 C)  TempSrc: Oral  SpO2: 97%  Weight: 228 lb (103.4 kg)  Height: 4\' 11"  (1.499 m)   Body mass index is 46.05 kg/m. Wt Readings from Last 3 Encounters:  01/20/21 228 lb (103.4 kg)  10/20/20 227 lb (103 kg)  07/16/20 227 lb (103 kg)    Physical Exam Vitals reviewed.  Constitutional:      Appearance: Normal appearance. She is obese.  HENT:     Head: Normocephalic.     Right Ear: Tympanic membrane, ear canal and external ear normal.     Left Ear: Tympanic membrane, ear canal and external ear normal.     Mouth/Throat:     Mouth: Mucous membranes are  moist.     Pharynx: Oropharynx is clear.  Eyes:     Extraocular Movements: Extraocular movements intact.     Conjunctiva/sclera: Conjunctivae normal.     Pupils: Pupils are equal, round, and reactive to light.  Cardiovascular:     Rate and Rhythm: Normal rate and regular rhythm.  Pulses: Normal pulses.     Heart sounds: Normal heart sounds.  Pulmonary:     Effort: Pulmonary effort is normal.     Breath sounds: Normal breath sounds.  Abdominal:     Palpations: Abdomen is soft.     Tenderness: There is no abdominal tenderness.  Musculoskeletal:     Cervical back: Normal range of motion and neck supple. No tenderness.     Right lower leg: No edema.     Left lower leg: No edema.  Lymphadenopathy:     Cervical: No cervical adenopathy.  Skin:    General: Skin is warm and dry.     Capillary Refill: Capillary refill takes less than 2 seconds.  Neurological:     General: No focal deficit present.     Mental Status: She is alert and oriented to person, place, and time.  Psychiatric:        Mood and Affect: Mood normal.        Behavior: Behavior normal.     ASSESSMENT & PLAN: Problem List Items Addressed This Visit       Cardiovascular and Mediastinum   Essential hypertension   Relevant Medications   spironolactone (ALDACTONE) 50 MG tablet   Other Relevant Orders   Comprehensive metabolic panel     Genitourinary   Stage 3a chronic kidney disease (HCC)   Relevant Medications   dapagliflozin propanediol (FARXIGA) 10 MG TABS tablet     Other   Morbid obesity (HCC)   Relevant Medications   dapagliflozin propanediol (FARXIGA) 10 MG TABS tablet   Dyslipidemia   Relevant Orders   Lipid panel   Other Visit Diagnoses     Routine general medical examination at a health care facility    -  Primary   History of gout       Relevant Orders   Uric acid   Prediabetes       Relevant Orders   Hemoglobin A1c   Screening for deficiency anemia       Screening for endocrine,  metabolic and immunity disorder       Relevant Orders   TSH      Modifiable risk factors discussed with patient. Anticipatory guidance according to age provided. The following topics were also discussed: Social Determinants of Health Smoking.  Non-smoker Diet and nutrition and need to decrease amount of daily carbohydrate intake Benefits of exercise Cancer screening and review of most recent mammogram and colonoscopy reports Vaccinations recommendations Cardiovascular risk associated with hypertension Hypertension management and medication review. Chronic kidney disease and benefits of Farxiga 10 mg daily Mental health including depression and anxiety Fall and accident prevention  Patient Instructions  Health Maintenance, Female Adopting a healthy lifestyle and getting preventive care are important in promoting health and wellness. Ask your health care provider about: The right schedule for you to have regular tests and exams. Things you can do on your own to prevent diseases and keep yourself healthy. What should I know about diet, weight, and exercise? Eat a healthy diet  Eat a diet that includes plenty of vegetables, fruits, low-fat dairy products, and lean protein. Do not eat a lot of foods that are high in solid fats, added sugars, or sodium. Maintain a healthy weight Body mass index (BMI) is used to identify weight problems. It estimates body fat based on height and weight. Your health care provider can help determine your BMI and help you achieve or maintain a healthy weight. Get regular exercise Get regular exercise.  This is one of the most important things you can do for your health. Most adults should: Exercise for at least 150 minutes each week. The exercise should increase your heart rate and make you sweat (moderate-intensity exercise). Do strengthening exercises at least twice a week. This is in addition to the moderate-intensity exercise. Spend less time sitting.  Even light physical activity can be beneficial. Watch cholesterol and blood lipids Have your blood tested for lipids and cholesterol at 59 years of age, then have this test every 5 years. Have your cholesterol levels checked more often if: Your lipid or cholesterol levels are high. You are older than 59 years of age. You are at high risk for heart disease. What should I know about cancer screening? Depending on your health history and family history, you may need to have cancer screening at various ages. This may include screening for: Breast cancer. Cervical cancer. Colorectal cancer. Skin cancer. Lung cancer. What should I know about heart disease, diabetes, and high blood pressure? Blood pressure and heart disease High blood pressure causes heart disease and increases the risk of stroke. This is more likely to develop in people who have high blood pressure readings or are overweight. Have your blood pressure checked: Every 3-5 years if you are 76-27 years of age. Every year if you are 61 years old or older. Diabetes Have regular diabetes screenings. This checks your fasting blood sugar level. Have the screening done: Once every three years after age 75 if you are at a normal weight and have a low risk for diabetes. More often and at a younger age if you are overweight or have a high risk for diabetes. What should I know about preventing infection? Hepatitis B If you have a higher risk for hepatitis B, you should be screened for this virus. Talk with your health care provider to find out if you are at risk for hepatitis B infection. Hepatitis C Testing is recommended for: Everyone born from [birth year] through 1965. Anyone with known risk factors for hepatitis C. Sexually transmitted infections (STIs) Get screened for STIs, including gonorrhea and chlamydia, if: You are sexually active and are younger than 59 years of age. You are older than 59 years of age and your health care provider  tells you that you are at risk for this type of infection. Your sexual activity has changed since you were last screened, and you are at increased risk for chlamydia or gonorrhea. Ask your health care provider if you are at risk. Ask your health care provider about whether you are at high risk for HIV. Your health care provider may recommend a prescription medicine to help prevent HIV infection. If you choose to take medicine to prevent HIV, you should first get tested for HIV. You should then be tested every 3 months for as long as you are taking the medicine. Pregnancy If you are about to stop having your period (premenopausal) and you may become pregnant, seek counseling before you get pregnant. Take 400 to 800 micrograms (mcg) of folic acid every day if you become pregnant. Ask for birth control (contraception) if you want to prevent pregnancy. Osteoporosis and menopause Osteoporosis is a disease in which the bones lose minerals and strength with aging. This can result in bone fractures. If you are 18 years old or older, or if you are at risk for osteoporosis and fractures, ask your health care provider if you should: Be screened for bone loss. Take a calcium or vitamin  D supplement to lower your risk of fractures. Be given hormone replacement therapy (HRT) to treat symptoms of menopause. Follow these instructions at home: Alcohol use Do not drink alcohol if: Your health care provider tells you not to drink. You are pregnant, may be pregnant, or are planning to become pregnant. If you drink alcohol: Limit how much you have to: 0-1 drink a day. Know how much alcohol is in your drink. In the [place] one drink equals one 12 oz bottle of beer (355 mL), one 5 oz glass of wine (148 mL), or one 1 oz glass of hard liquor (44 mL). Lifestyle Do not use any products that contain nicotine or tobacco. These products include cigarettes, chewing tobacco, and vaping devices, such as e-cigarettes. If you  need help quitting, ask your health care provider. Do not use street drugs. Do not share needles. Ask your health care provider for help if you need support or information about quitting drugs. General instructions Schedule regular health, dental, and eye exams. Stay current with your vaccines. Tell your health care provider if: You often feel depressed. You have ever been abused or do not feel safe at home. Summary Adopting a healthy lifestyle and getting preventive care are important in promoting health and wellness. Follow your health care provider's instructions about healthy diet, exercising, and getting tested or screened for diseases. Follow your health care provider's instructions on monitoring your cholesterol and blood pressure. This information is not intended to replace advice given to you by your health care provider. Make sure you discuss any questions you have with your health care provider. Document Revised: 06/14/2020 Document Reviewed: 06/14/2020 Elsevier Patient Education  2022 Elsevier Inc.     Edwina Barth, MD [facility]

## 2021-01-20 NOTE — Patient Instructions (Signed)

## 2021-04-20 ENCOUNTER — Ambulatory Visit: Payer: BC Managed Care – PPO | Admitting: Emergency Medicine

## 2021-04-20 DIAGNOSIS — M109 Gout, unspecified: Secondary | ICD-10-CM | POA: Diagnosis not present

## 2021-04-20 DIAGNOSIS — M79672 Pain in left foot: Secondary | ICD-10-CM | POA: Diagnosis not present

## 2021-04-20 DIAGNOSIS — I10 Essential (primary) hypertension: Secondary | ICD-10-CM | POA: Diagnosis not present

## 2021-04-20 DIAGNOSIS — M1990 Unspecified osteoarthritis, unspecified site: Secondary | ICD-10-CM | POA: Diagnosis not present

## 2021-05-02 ENCOUNTER — Telehealth: Payer: Self-pay

## 2021-05-02 NOTE — Telephone Encounter (Signed)
Pt is requesting an updated letter stating that she can not wear steel toe shoes due to swelling and gout.  ? ?Please contact  pt if an appt is needed before June. Also let pt know when its complete to pick up. ? ?Pt CB [phone] ?

## 2021-05-03 ENCOUNTER — Encounter: Payer: Self-pay | Admitting: *Deleted

## 2021-05-03 NOTE — Telephone Encounter (Signed)
I just saw her on 01/20/2021.  Has history of gout with intermittent flareups.  Office visit not needed at this time.  Okay to write letter.  Thanks.

## 2021-05-17 ENCOUNTER — Telehealth: Payer: Self-pay | Admitting: *Deleted

## 2021-05-17 NOTE — Telephone Encounter (Signed)
Patient dropped of ADA paperwork, Placed in provider box to signed Once signed please call patient for pick up  ?

## 2021-05-17 NOTE — Telephone Encounter (Signed)
Called patient and left message for patient to call office in reference to ADA paperwork and a few questions that needs clarification.  ?

## 2021-05-18 NOTE — Telephone Encounter (Signed)
Can you call pt at [phone] to try get her pw completed.  ?[phone] ?

## 2021-05-23 NOTE — Telephone Encounter (Signed)
Called patient and left message pertaining to ADA paperwork  ?

## 2021-05-23 NOTE — Telephone Encounter (Signed)
Paper work in provider box to sign. Will call patient when completed ?

## 2021-05-23 NOTE — Telephone Encounter (Signed)
Called patient to inform her that her paperwork is completed and ready for pick up. Paperwork will be at the front desk  ?

## 2021-06-15 ENCOUNTER — Ambulatory Visit (INDEPENDENT_AMBULATORY_CARE_PROVIDER_SITE_OTHER): Payer: BC Managed Care – PPO | Admitting: Emergency Medicine

## 2021-06-15 ENCOUNTER — Encounter: Payer: Self-pay | Admitting: Emergency Medicine

## 2021-06-15 VITALS — BP 132/72 | HR 76 | Temp 98.9°F | Ht 59.0 in | Wt 224.4 lb

## 2021-06-15 DIAGNOSIS — I10 Essential (primary) hypertension: Secondary | ICD-10-CM | POA: Diagnosis not present

## 2021-06-15 DIAGNOSIS — R2232 Localized swelling, mass and lump, left upper limb: Secondary | ICD-10-CM

## 2021-06-15 DIAGNOSIS — M65949 Unspecified synovitis and tenosynovitis, unspecified hand: Secondary | ICD-10-CM | POA: Insufficient documentation

## 2021-06-15 DIAGNOSIS — M659 Synovitis and tenosynovitis, unspecified: Secondary | ICD-10-CM | POA: Diagnosis not present

## 2021-06-15 DIAGNOSIS — J302 Other seasonal allergic rhinitis: Secondary | ICD-10-CM

## 2021-06-15 DIAGNOSIS — N1831 Chronic kidney disease, stage 3a: Secondary | ICD-10-CM

## 2021-06-15 MED ORDER — IPRATROPIUM BROMIDE 0.03 % NA SOLN: 2.0000 | Freq: Two times a day (BID) | NASAL | 1 refills | Status: DC

## 2021-06-15 MED ORDER — PREDNISONE 20 MG PO TABS: 20.0000 mg | ORAL_TABLET | Freq: Every day | ORAL | 0 refills | Status: AC

## 2021-06-15 MED ORDER — SPIRONOLACTONE 50 MG PO TABS: 50.0000 mg | ORAL_TABLET | Freq: Every day | ORAL | 3 refills | Status: DC

## 2021-06-15 NOTE — Progress Notes (Signed)
[name] ?61 y.o. ? ? ?Chief Complaint  ?Patient presents with  ? swelling in left hand   ?  X 3 weeks   ? Allergies  ? ? ?HISTORY OF PRESENT ILLNESS: ?This is a 61 y.o. female A1A complaining of swelling to left hand for the past 3 weeks.  Denies injury. ?Has history of gout. ?Also complaining of allergies with rhinorrhea and nasal congestion for several days.  Has history of seasonal allergies. ? ?HPI ? ? ?Prior to Admission medications   ?Medication Sig Start Date End Date Taking? Authorizing Provider  ?allopurinol (ZYLOPRIM) 100 MG tablet Take 100 mg by mouth daily.   Yes [provider]  ?NON FORMULARY Oregano oil   Yes [provider]  ?olmesartan (BENICAR) 40 MG tablet TAKE 1 TABLET BY MOUTH EVERY DAY 09/20/20  Yes Xoey Warmoth, Eilleen Kempf, MD  ?rosuvastatin (CRESTOR) 10 MG tablet Take 1 tablet (10 mg total) by mouth daily. 10/20/20  Yes Yichen Gilardi, Eilleen Kempf, MD  ?sodium chloride (OCEAN) 0.65 % SOLN nasal spray Place 1 spray into both nostrils as needed for congestion. 05/23/20  Yes Roxy Horseman, PA-C  ?Turmeric POWD 1,950 mg by Does not apply route 3 (three) times daily.   Yes [provider]  ?diclofenac Sodium (VOLTAREN) 1 % GEL Apply 2 g topically 4 (four) times daily. ?Patient not taking: Reported on 01/20/2021 02/03/20   Just, Azalee Course, FNP  ?ipratropium (ATROVENT) 0.03 % nasal spray Place 2 sprays into both nostrils every 12 (twelve) hours. ?Patient not taking: Reported on 10/20/2020 05/23/20   Roxy Horseman, PA-C  ?spironolactone (ALDACTONE) 50 MG tablet Take 1 tablet (50 mg total) by mouth daily. 01/20/21 04/20/21  Georgina Quint, MD  ? ? ?Allergies  ?Allergen Reactions  ? Chlorthalidone Other (See Comments)  ?  Severe gout  ? Doxycycline Hives, Itching and Swelling  ? Norvasc [Amlodipine Besylate] Swelling  ?  Feet swelling  ? Septra [Bactrim] Hives, Itching and Swelling  ? Latex Swelling and Rash  ? ? ?Patient Active Problem List  ? Diagnosis Date Noted  ?  Dyslipidemia 10/20/2020  ? Stage 3a chronic kidney disease (HCC) 10/20/2020  ? 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 Morbid obesity (HCC) 08/18/2013  ? Essential hypertension 08/07/2008  ? ? ?Past Medical History:  ?Diagnosis Date  ? Allergy   ? Anxiety   ? Arthritis   ? Chest pain 08/03/08  ? Urgent care visit  ? Depression   ? Dyspnea   ? Edema   ? Heart murmur   ? Hypertension   ? Obesity   ? ? ?Past Surgical History:  ?Procedure Laterality Date  ? CESAREAN SECTION  04/16/1980  ? DILATION AND CURETTAGE OF UTERUS    ? laparoscopic  ? ? ?Social History  ? ?Socioeconomic History  ? Marital status: Single  ?  Spouse name: Not on file  ? Number of children: 1  ? Years of education: Not on file  ? Highest education level: Not on file  ?Occupational History  ? 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 Smoking status: Never  ? Smokeless tobacco: Never  ? Tobacco comments:  ?  Nonsmoker  ?Substance and Sexual Activity  ? Alcohol use: No  ?  Alcohol/week: 0.0 standard drinks  ? Drug use: No  ? Sexual activity: Yes  ?  Birth control/protection: None  ?Other Topics Concern  ? Not on file  ?Social  History Narrative  ? Lives with mother  ? Marital status: no   ? Children: 1 daughter  ? Grandchildren - 2 (granddaughter and grandson)  ? Lives with: alone  ? 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 Tobacco:  none  ? Alcohol:  none  ? Drugs:  none  ? Exercise:  Active job  ? Seatbelt: 100%  ? Guns in home: none  ?   ? Filed for bankruptcy in 2010  ?   ? ?Social Determinants of Health  ? ?Financial Resource Strain: Not on file  ?Food Insecurity: Not on file  ?Transportation Needs: Not on file  ?Physical Activity: Not on file  ?Stress: Not on file  ?Social Connections: Not on file  ?Intimate Partner Violence: Not on file  ? ? ?Family History  ?Problem Relation Age of Onset  ? Prostate cancer Father   ? Congestive Heart Failure Father    ? Hypertension Father   ? Colon cancer Maternal Uncle   ? Colon polyps Mother   ? Breast cancer Mother 15  ?     Left  ? Hyperlipidemia Mother   ? Hypertension Mother   ? Hypertension Sister   ? Hypertension Brother   ? Hypertension Sister   ? Hypertension Sister   ? Hypertension Sister   ? Hyperlipidemia Maternal Grandmother   ? Hypertension Maternal Grandmother   ? Esophageal cancer Neg Hx   ? Stomach cancer Neg Hx   ? Rectal cancer Neg Hx   ? ? ? ?Review of Systems  ?Constitutional: Negative.  Negative for chills and fever.  ?HENT:  Positive for congestion.   ?Eyes: Negative.   ?Respiratory: Negative.  Negative for cough and shortness of breath.   ?Cardiovascular: Negative.  Negative for chest pain and palpitations.  ?Gastrointestinal:  Negative for abdominal pain, diarrhea, nausea and vomiting.  ?Genitourinary: Negative.   ?Musculoskeletal:  Positive for joint pain.  ?Skin: Negative.  Negative for rash.  ?Endo/Heme/Allergies:  Positive for environmental allergies.  ?All other systems reviewed and are negative. ? ?Today's Vitals  ? 06/15/21 0955  ?BP: 132/72  ?Pulse: 76  ?Temp: 98.9 ?F (37.2 ?C)  ?TempSrc: Oral  ?SpO2: 96%  ?Weight: 224 lb 6 oz (101.8 kg)  ?Height: 4\' 11"  (1.499 m)  ? ?Body mass index is 45.32 kg/m?. ?Wt Readings from Last 3 Encounters:  ?06/15/21 224 lb 6 oz (101.8 kg)  ?01/20/21 228 lb (103.4 kg)  ?10/20/20 227 lb (103 kg)  ? ? ?Physical Exam ?Vitals reviewed.  ?Constitutional:   ?   Appearance: Normal appearance.  ?HENT:  ?   Head: Normocephalic.  ?Eyes:  ?   Extraocular Movements: Extraocular movements intact.  ?   Pupils: Pupils are equal, round, and reactive to light.  ?Cardiovascular:  ?   Rate and Rhythm: Normal rate and regular rhythm.  ?   Pulses: Normal pulses.  ?   Heart sounds: Normal heart sounds.  ?Pulmonary:  ?   Effort: Pulmonary effort is normal.  ?   Breath sounds: Normal breath sounds.  ?Musculoskeletal:  ?   Cervical back: No tenderness.  ?   Comments: Left hand: Swelling  and tenderness without erythema at the base of the thumb and thenar eminence with slightly diminished range of motion.  Some swelling and tenderness noticed at second MCP joint.  Otherwise unremarkable  ?Lymphadenopathy:  ?   Cervical: No cervical adenopathy.  ?Skin: ?   General: Skin is warm and  dry.  ?   Capillary Refill: Capillary refill takes less than 2 seconds.  ?Neurological:  ?   General: No focal deficit present.  ?   Mental Status: She is alert and oriented to person, place, and time.  ?Psychiatric:     ?   Mood and Affect: Mood normal.     ?   Behavior: Behavior normal.  ? ? ? ?ASSESSMENT & PLAN: ?Problem List Items Addressed This Visit   ? ?  ? Cardiovascular and Mediastinum  ? Essential hypertension  ?  Well-controlled hypertension. ?BP Readings from Last 3 Encounters:  ?06/15/21 132/72  ?01/20/21 136/70  ?10/20/20 122/70  ?Continue olmesartan 40 mg and Aldactone 50 mg daily. ?Dietary approaches to stop hypertension discussed. ? ?  ?  ? Relevant Medications  ? spironolactone (ALDACTONE) 50 MG tablet  ?  ? Musculoskeletal and Integument  ? Synovitis of finger  ?  Do not recommend NSAIDs.  Had hypertensive reaction in the past ?Recommend low-dose prednisone 20 mg daily for 5 days ? ?  ?  ? Relevant Medications  ? predniSONE (DELTASONE) 20 MG tablet  ?  ? Genitourinary  ? Stage 3a chronic kidney disease (HCC)  ?  Advised to stay well-hydrated and avoid NSAIDs. ? ?  ?  ?  ? Other  ? Seasonal allergies  ?  Continue Atrovent nasal spray.  Continue saline nasal spray. ? ?  ?  ? Relevant Medications  ? ipratropium (ATROVENT) 0.03 % nasal spray  ? Localized swelling of left thumb - Primary  ? ?Patient Instructions  ?Tenosynovitis ? ?Tenosynovitis is inflammation of a tendon and of the sleeve of tissue that covers the tendon (tendon sheath). A tendon is a cord of tissue that connects muscle to bone. Normally, a tendon slides smoothly inside its tendon sheath. Tenosynovitis limits movement of the tendon and  surrounding tissues, which may cause pain, swelling, and stiffness. ?Tenosynovitis can affect any tendon and tendon sheath. Commonly affected areas include tendons in the: ?Wrist. ?Arm. ?Hand. ?Hip. ?Leg. ?Foot.

## 2021-06-15 NOTE — Assessment & Plan Note (Signed)
Well-controlled hypertension. ?BP Readings from Last 3 Encounters:  ?06/15/21 132/72  ?01/20/21 136/70  ?10/20/20 122/70  ?Continue olmesartan 40 mg and Aldactone 50 mg daily. ?Dietary approaches to stop hypertension discussed. ? ?

## 2021-06-15 NOTE — Patient Instructions (Signed)
Tenosynovitis  Tenosynovitis is inflammation of a tendon and of the sleeve of tissue that covers the tendon (tendon sheath). A tendon is a cord of tissue that connects muscle to bone. Normally, a tendon slides smoothly inside its tendon sheath. Tenosynovitis limits movement of the tendon and surrounding tissues, which may cause pain, swelling, and stiffness. Tenosynovitis can affect any tendon and tendon sheath. Commonly affected areas include tendons in the: Wrist. Arm. Hand. Hip. Leg. Foot. Shoulder. What are the causes? The main cause of this condition is wear and tear over time that results in slight tears in the tendon. Other possible causes include: An injury to the tendon or tendon sheath. A disease that causes inflammation in the body. An infection that spreads to the tendon and tendon sheath from a skin wound. An infection in another part of the body that spreads to the tendon and tendon sheath through the blood. What increases the risk? The following factors may make you more likely to develop this condition: Having rheumatoid arthritis, gout, or diabetes. Using IV drugs. Doing physical activities that can cause tendon overuse and stress. Having gonorrhea. What are the signs or symptoms? Symptoms of this condition depend on the cause. Symptoms may include: Pain with movement. Pain when pressing on the tendon and tendon sheath. Swelling. Stiffness. If tenosynovitis is caused by an infection, additional symptoms may include: Fever. Redness. Warmth. How is this diagnosed? This condition may be diagnosed based on your medical history and a physical exam. You also may have: Blood tests. Imaging tests, such as: MRI. Ultrasound. A sample of fluid removed from inside the tendon sheath to be checked in a lab. How is this treated? Treatment for this condition depends on the cause. If tenosynovitis is not caused by an infection, treatment may include: Rest. Keeping the  tendon in place (immobilization) in a splint, brace, or sling. Taking NSAIDs, such as ibuprofen, to reduce pain and swelling. A shot (injection) of a steroid medicine to help reduce pain and swelling. Icing or applying heat to the affected area. Physical or occupational therapy. Surgery to release the tendon in the sheath, to release the area that the tendon rests in, or to repair damage to the tendon or tendon sheath. Surgery may be done if other treatments do not help relieve symptoms. If tenosynovitis is caused by infection, treatment may include antibiotic medicine given through an IV. In most cases, surgery may be needed to drain fluid from the tendon sheath, to clean and rinse out (irrigate) the tendon sheath, or to remove the tendon sheath. Follow these instructions at home: If you have a removable splint, brace, or sling:  Wear the splint, brace, or sling as told by your health care provider. Remove it only as told by your health care provider. Check the skin around the splint, brace, or sling every day. Tell your health care provider about any concerns. Loosen the splint, brace, or sling if your fingers or toes tingle, become numb, or turn cold and blue. Keep the splint, brace, or sling clean. If the splint, brace, or sling is not waterproof: Do not let it get wet. Cover it with a watertight covering when you take a bath or shower. Managing pain, stiffness, and swelling  If directed, put ice on the affected area. To do this: If you have a removable splint, brace, or sling, remove it as told by your health care provider. Put ice in a plastic bag. Place a towel between your skin and the bag.   Leave the ice on for 20 minutes, 2-3 times a day. Remove the ice if your skin turns bright red. This is very important. If you cannot feel pain, heat, or cold, you have a greater risk of damage to the area. Move the fingers or toes of the affected limb often, if this applies. This can help to  reduce stiffness and swelling. If directed, raise (elevate) the affected area above the level of your heart while you are sitting or lying down. If directed, apply heat to the affected area before you exercise or do therapy. Use the heat source that your health care provider recommends, such as a moist heat pack or a heating pad. Place a towel between your skin and the heat source. Leave the heat on for 20-30 minutes. Remove the heat if your skin turns bright red. This is especially important if you are unable to feel pain, heat, or cold. You have a greater risk of getting burned. Activity Return to your normal activities as told by your health care provider. Ask your health care provider what activities are safe for you. Rest the affected area as told by your health care provider. Avoid using the affected area while you are having symptoms. Do not use the injured limb to support your body weight until your health care provider says that you can. If physical therapy was prescribed, do exercises as told by your health care provider. General instructions Take over-the-counter and prescription medicines only as told by your health care provider. Ask your health care provider when it is safe to drive if you have a splint, brace, or sling on any part of your arm or leg. Keep all follow-up visits. This is important. Contact a health care provider if: Your symptoms are not improving or are getting worse. You have a fever and worsening of any of the following symptoms: Pain. Redness. Warmth. Swelling. Get help right away if: Your fingers or toes become numb or turn blue. Summary Tenosynovitis is inflammation of a tendon and of the sleeve of tissue that covers the tendon (tendon sheath). Treatment for this condition depends on the cause. Treatment may include rest, medicines, physical therapy, or surgery. Contact a health care provider if your symptoms are not improving or are getting worse. Keep  all follow-up visits. This information is not intended to replace advice given to you by your health care provider. Make sure you discuss any questions you have with your health care provider. Document Revised: 09/29/2020 Document Reviewed: 09/29/2020 Elsevier Patient Education  2023 Elsevier Inc.  

## 2021-06-15 NOTE — Assessment & Plan Note (Signed)
Advised to stay well-hydrated and avoid NSAIDs. ?

## 2021-06-15 NOTE — Assessment & Plan Note (Signed)
Do not recommend NSAIDs.  Had hypertensive reaction in the past ?Recommend low-dose prednisone 20 mg daily for 5 days ?

## 2021-06-15 NOTE — Assessment & Plan Note (Signed)
Continue Atrovent nasal spray.  Continue saline nasal spray. ?

## 2021-07-07 ENCOUNTER — Other Ambulatory Visit: Payer: Self-pay | Admitting: Emergency Medicine

## 2021-07-07 DIAGNOSIS — J302 Other seasonal allergic rhinitis: Secondary | ICD-10-CM

## 2021-07-24 ENCOUNTER — Other Ambulatory Visit: Payer: Self-pay | Admitting: Emergency Medicine

## 2021-07-24 DIAGNOSIS — J302 Other seasonal allergic rhinitis: Secondary | ICD-10-CM

## 2021-07-27 ENCOUNTER — Ambulatory Visit (INDEPENDENT_AMBULATORY_CARE_PROVIDER_SITE_OTHER): Payer: BC Managed Care – PPO | Admitting: Emergency Medicine

## 2021-07-27 ENCOUNTER — Encounter: Payer: Self-pay | Admitting: Emergency Medicine

## 2021-07-27 VITALS — BP 136/72 | HR 82 | Temp 98.7°F | Wt 230.2 lb

## 2021-07-27 DIAGNOSIS — I10 Essential (primary) hypertension: Secondary | ICD-10-CM | POA: Diagnosis not present

## 2021-07-27 DIAGNOSIS — N1831 Chronic kidney disease, stage 3a: Secondary | ICD-10-CM | POA: Diagnosis not present

## 2021-07-27 DIAGNOSIS — Z8739 Personal history of other diseases of the musculoskeletal system and connective tissue: Secondary | ICD-10-CM

## 2021-07-27 DIAGNOSIS — E785 Hyperlipidemia, unspecified: Secondary | ICD-10-CM | POA: Diagnosis not present

## 2021-07-27 DIAGNOSIS — R7303 Prediabetes: Secondary | ICD-10-CM

## 2021-07-27 DIAGNOSIS — M1A09X1 Idiopathic chronic gout, multiple sites, with tophus (tophi): Secondary | ICD-10-CM

## 2021-07-27 NOTE — Progress Notes (Signed)
[name] 61 y.o.   Chief Complaint  Patient presents with   Follow-up    No concerns     HISTORY OF PRESENT ILLNESS: This is a 61 y.o. female here for 19-month follow-up of hypertension, dyslipidemia, prediabetes.  Doing well. Has no complaints or medical concerns today.  HPI   Prior to Admission medications   Medication Sig Start Date End Date Taking? Authorizing Provider  allopurinol (ZYLOPRIM) 100 MG tablet Take 100 mg by mouth daily.   Yes [provider]  ipratropium (ATROVENT) 0.03 % nasal spray USE 2 SPRAYS INTO BOTH NOSTRILS EVERY 12 HOURS 07/24/21  Yes Courtnei Ruddell, Eilleen Kempf, MD  NON FORMULARY Oregano oil   Yes [provider]  olmesartan (BENICAR) 40 MG tablet TAKE 1 TABLET BY MOUTH EVERY DAY 09/20/20  Yes Arlena Marsan, Eilleen Kempf, MD  rosuvastatin (CRESTOR) 10 MG tablet Take 1 tablet (10 mg total) by mouth daily. 10/20/20  Yes Ethan Kasperski, Eilleen Kempf, MD  spironolactone (ALDACTONE) 50 MG tablet Take 1 tablet (50 mg total) by mouth daily. 06/15/21 06/10/22 Yes Helma Argyle, Eilleen Kempf, MD  Turmeric POWD 1,950 mg by Does not apply route 3 (three) times daily.   Yes [provider]  diclofenac Sodium (VOLTAREN) 1 % GEL Apply 2 g topically 4 (four) times daily. Patient not taking: Reported on 01/20/2021 02/03/20   Just, Azalee Course, FNP  sodium chloride (OCEAN) 0.65 % SOLN nasal spray Place 1 spray into both nostrils as needed for congestion. 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   Morbid obesity (HCC) 08/18/2013   Essential hypertension 08/07/2008    Past Medical History:  Diagnosis Date   Allergy    Anxiety    Arthritis    Chest pain 08/03/08   Urgent care visit   Depression    Dyspnea    Edema    Heart murmur    Hypertension    Obesity     Past Surgical History:  Procedure Laterality Date   CESAREAN SECTION  04/16/1980   DILATION AND CURETTAGE OF UTERUS     laparoscopic    Social History   Socioeconomic History   Marital status: Single    Spouse name: Not on file   Number of children: 1   Years of education: Not on file   Highest education level: Not on file  Occupational History   Occupation: 3 jobs    Comment: Education officer, environmental and textile  Tobacco Use   Smoking status: Never   Smokeless tobacco: Never   Tobacco comments:    Nonsmoker  Substance and Sexual Activity   Alcohol use: No    Alcohol/week: 0.0 standard drinks of alcohol   Drug use: No   Sexual activity: Yes    Birth control/protection: None  Other Topics Concern   Not on file  Social History Narrative   Lives with mother   Marital status: no  Children: 1 daughter   Grandchildren - 2 (granddaughter and grandson)   Lives with: alone   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   Tobacco:  none   Alcohol:  none   Drugs:  none   Exercise:  Active job   Seatbelt: 100%   Guns in home: none      Filed for bankruptcy in 2010      Social Determinants of Health   Financial Resource Strain: Not on file  Food Insecurity: Not on file  Transportation Needs: Not on file  Physical Activity: Not on file  Stress: Not on file  Social Connections: Not on file  Intimate Partner Violence: Not on file    Family History  Problem Relation Age of Onset   Prostate cancer Father    Congestive Heart Failure Father    Hypertension Father    Colon cancer  Maternal Uncle    Colon polyps Mother    Breast cancer Mother 45       Left   Hyperlipidemia Mother    Hypertension Mother    Hypertension Sister    Hypertension Brother    Hypertension Sister    Hypertension Sister    Hypertension Sister    Hyperlipidemia Maternal Grandmother    Hypertension Maternal Grandmother    Esophageal cancer Neg Hx    Stomach cancer Neg Hx    Rectal cancer Neg Hx      Review of Systems  Constitutional: Negative.  Negative for chills and fever.  HENT: Negative.  Negative for congestion and sore throat.   Respiratory: Negative.  Negative for cough and shortness of breath.   Cardiovascular: Negative.  Negative for chest pain and palpitations.  Gastrointestinal: Negative.  Negative for abdominal pain, diarrhea, nausea and vomiting.  Genitourinary: Negative.  Negative for dysuria and hematuria.  Musculoskeletal: Negative.   Skin: Negative.  Negative for rash.  Neurological:  Negative for dizziness and headaches.  All other systems reviewed and are negative.  Today's Vitals   07/27/21 1550  BP: 136/72  Pulse: 82  Temp: 98.7 F (37.1 C)  TempSrc: Oral  SpO2: 97%  Weight: 230 lb 4 oz (104.4 kg)   Body mass index is 46.5 kg/m.   Physical Exam Vitals reviewed.  Constitutional:      Appearance: Normal appearance. She is obese.  HENT:     Head: Normocephalic.  Eyes:     Extraocular Movements: Extraocular movements intact.     Conjunctiva/sclera: Conjunctivae normal.     Pupils: Pupils are equal, round, and reactive to light.  Cardiovascular:     Rate and Rhythm: Normal rate and regular rhythm.     Pulses: Normal pulses.     Heart sounds: Normal heart sounds.  Pulmonary:     Effort: Pulmonary effort is normal.     Breath sounds: Normal breath sounds.  Abdominal:     Palpations: Abdomen is soft.     Tenderness: There is no abdominal tenderness.  Musculoskeletal:        General: Normal range of motion.     Cervical back: No tenderness.   Lymphadenopathy:     Cervical: No cervical adenopathy.  Skin:    General: Skin is warm and dry.     Capillary Refill: Capillary refill takes less than 2 seconds.  Neurological:     General: No focal deficit present.     Mental Status: She is alert and oriented to person, place, and time.  Psychiatric:        Mood and Affect: Mood normal.        Behavior: Behavior normal.      ASSESSMENT & PLAN: A total of 51-minutes was spent with the patient and counseling/coordination of care regarding preparing for this visit, review of most recent office visit notes, review of most recent blood work results, review of multiple chronic medical problems and their management, review of all medications, education on nutrition, prognosis, documentation and need for follow-up.  Problem List Items Addressed This Visit       Cardiovascular and Mediastinum   Essential hypertension - Primary    Well-controlled hypertension.  Continue olmesartan 40 mg and Aldactone 50 mg daily. BP Readings from Last 3 Encounters:  07/27/21 136/72  06/15/21 132/72  01/20/21 136/70           Musculoskeletal and Integument   Idiopathic chronic gout of multiple sites with tophus    No recent flareups.  Stable. Continue allopurinol 100 mg daily.  Sees rheumatologist on a regular basis.        Genitourinary   Stage 3a chronic kidney disease (HCC)    Stable.  Advised to stay well-hydrated and avoid NSAIDs.        Other   Dyslipidemia    Stable.  Diet and nutrition discussed.  Continue rosuvastatin 10 mg daily. The 10-year ASCVD risk score (Arnett DK, et al., 2019) is: 6.3%   Values used to calculate the score:     Age: [age] years     Sex: Female     Is Non-Hispanic African American: Yes     Diabetic: No     Tobacco smoker: No     Systolic Blood Pressure: 136 mmHg     Is BP treated: Yes     HDL Cholesterol: 54.6 mg/dL     Total Cholesterol: 150 mg/dL       History of gout   Prediabetes    Diet and  nutrition discussed.  Advised to decrease amount of daily carbohydrate intake and daily calories.      Patient Instructions  Health Maintenance, Female Adopting a healthy lifestyle and getting preventive care are important in promoting health and wellness. Ask your health care provider about: The right schedule for you to have regular tests and exams. Things you can do on your own to prevent diseases and keep yourself healthy. What should I know about diet, weight, and exercise? Eat a healthy diet  Eat a diet that includes plenty of vegetables, fruits, low-fat dairy products, and lean protein. Do not eat a lot of foods that are high in solid fats, added sugars, or sodium. Maintain a healthy weight Body mass index (BMI) is used to identify weight problems. It estimates body fat based on height and weight. Your health care provider can help determine your BMI and help you achieve or maintain a healthy weight. Get regular exercise Get regular exercise. This is one of the most important things you can do for your health. Most adults should: Exercise for at least 150 minutes each week. The exercise should increase your heart rate and make you sweat (moderate-intensity exercise). Do strengthening exercises at least twice a week. This is in addition to the moderate-intensity exercise. Spend less time sitting. Even light physical activity can be beneficial. Watch cholesterol and blood lipids Have your blood tested for lipids and cholesterol at 60 years of age, then have this test every 5 years. Have your cholesterol levels checked more  often if: Your lipid or cholesterol levels are high. You are older than 60 years of age. You are at high risk for heart disease. What should I know about cancer screening? Depending on your health history and family history, you may need to have cancer screening at various ages. This may include screening for: Breast cancer. Cervical cancer. Colorectal  cancer. Skin cancer. Lung cancer. What should I know about heart disease, diabetes, and high blood pressure? Blood pressure and heart disease High blood pressure causes heart disease and increases the risk of stroke. This is more likely to develop in people who have high blood pressure readings or are overweight. Have your blood pressure checked: Every 3-5 years if you are 63-24 years of age. Every year if you are 69 years old or older. Diabetes Have regular diabetes screenings. This checks your fasting blood sugar level. Have the screening done: Once every three years after age 49 if you are at a normal weight and have a low risk for diabetes. More often and at a younger age if you are overweight or have a high risk for diabetes. What should I know about preventing infection? Hepatitis B If you have a higher risk for hepatitis B, you should be screened for this virus. Talk with your health care provider to find out if you are at risk for hepatitis B infection. Hepatitis C Testing is recommended for: Everyone born from [birth year] through 1964. Anyone with known risk factors for hepatitis C. Sexually transmitted infections (STIs) Get screened for STIs, including gonorrhea and chlamydia, if: You are sexually active and are younger than 60 years of age. You are older than 60 years of age and your health care provider tells you that you are at risk for this type of infection. Your sexual activity has changed since you were last screened, and you are at increased risk for chlamydia or gonorrhea. Ask your health care provider if you are at risk. Ask your health care provider about whether you are at high risk for HIV. Your health care provider may recommend a prescription medicine to help prevent HIV infection. If you choose to take medicine to prevent HIV, you should first get tested for HIV. You should then be tested every 3 months for as long as you are taking the medicine. Pregnancy If you are  about to stop having your period (premenopausal) and you may become pregnant, seek counseling before you get pregnant. Take 400 to 800 micrograms (mcg) of folic acid every day if you become pregnant. Ask for birth control (contraception) if you want to prevent pregnancy. Osteoporosis and menopause Osteoporosis is a disease in which the bones lose minerals and strength with aging. This can result in bone fractures. If you are 53 years old or older, or if you are at risk for osteoporosis and fractures, ask your health care provider if you should: Be screened for bone loss. Take a calcium or vitamin D supplement to lower your risk of fractures. Be given hormone replacement therapy (HRT) to treat symptoms of menopause. Follow these instructions at home: Alcohol use Do not drink alcohol if: Your health care provider tells you not to drink. You are pregnant, may be pregnant, or are planning to become pregnant. If you drink alcohol: Limit how much you have to: 0-1 drink a day. Know how much alcohol is in your drink. In the [place] one drink equals one 12 oz bottle of beer (355 mL), one 5 oz glass of wine (148 mL),  or one 1 oz glass of hard liquor (44 mL). Lifestyle Do not use any products that contain nicotine or tobacco. These products include cigarettes, chewing tobacco, and vaping devices, such as e-cigarettes. If you need help quitting, ask your health care provider. Do not use street drugs. Do not share needles. Ask your health care provider for help if you need support or information about quitting drugs. General instructions Schedule regular health, dental, and eye exams. Stay current with your vaccines. Tell your health care provider if: You often feel depressed. You have ever been abused or do not feel safe at home. Summary Adopting a healthy lifestyle and getting preventive care are important in promoting health and wellness. Follow your health care provider's instructions about  healthy diet, exercising, and getting tested or screened for diseases. Follow your health care provider's instructions on monitoring your cholesterol and blood pressure. This information is not intended to replace advice given to you by your health care provider. Make sure you discuss any questions you have with your health care provider. Document Revised: 06/14/2020 Document Reviewed: 06/14/2020 Elsevier Patient Education  2023 Elsevier Inc.       Edwina Barth, MD [facility]

## 2021-07-27 NOTE — Assessment & Plan Note (Signed)
No recent flareups.  Stable. Continue allopurinol 100 mg daily.  Sees rheumatologist on a regular basis.

## 2021-07-27 NOTE — Assessment & Plan Note (Signed)
Diet and nutrition discussed. Advised to decrease amount of daily carbohydrate intake and daily calories. 

## 2021-07-27 NOTE — Assessment & Plan Note (Signed)
Well-controlled hypertension.  Continue olmesartan 40 mg and Aldactone 50 mg daily. BP Readings from Last 3 Encounters:  07/27/21 136/72  06/15/21 132/72  01/20/21 136/70

## 2021-07-27 NOTE — Patient Instructions (Signed)

## 2021-07-27 NOTE — Assessment & Plan Note (Signed)
Stable.  Advised to stay well-hydrated and avoid NSAIDs. 

## 2021-07-27 NOTE — Assessment & Plan Note (Signed)
Stable.  Diet and nutrition discussed.  Continue rosuvastatin 10 mg daily. The 10-year ASCVD risk score (Arnett DK, et al., 2019) is: 6.3%   Values used to calculate the score:     Age: 61 years     Sex: Female     Is Non-Hispanic African American: Yes     Diabetic: No     Tobacco smoker: No     Systolic Blood Pressure: 136 mmHg     Is BP treated: Yes     HDL Cholesterol: 54.6 mg/dL     Total Cholesterol: 150 mg/dL

## 2021-08-07 ENCOUNTER — Other Ambulatory Visit: Payer: Self-pay | Admitting: Emergency Medicine

## 2021-08-07 DIAGNOSIS — J302 Other seasonal allergic rhinitis: Secondary | ICD-10-CM

## 2021-09-20 ENCOUNTER — Other Ambulatory Visit: Payer: Self-pay | Admitting: Emergency Medicine

## 2021-09-20 DIAGNOSIS — I10 Essential (primary) hypertension: Secondary | ICD-10-CM

## 2021-10-18 ENCOUNTER — Other Ambulatory Visit: Payer: Self-pay | Admitting: Emergency Medicine

## 2021-10-18 DIAGNOSIS — E785 Hyperlipidemia, unspecified: Secondary | ICD-10-CM

## 2021-12-26 DIAGNOSIS — Z124 Encounter for screening for malignant neoplasm of cervix: Secondary | ICD-10-CM | POA: Diagnosis not present

## 2021-12-26 DIAGNOSIS — Z6841 Body Mass Index (BMI) 40.0 and over, adult: Secondary | ICD-10-CM | POA: Diagnosis not present

## 2021-12-26 DIAGNOSIS — Z01419 Encounter for gynecological examination (general) (routine) without abnormal findings: Secondary | ICD-10-CM | POA: Diagnosis not present

## 2021-12-26 DIAGNOSIS — Z1231 Encounter for screening mammogram for malignant neoplasm of breast: Secondary | ICD-10-CM | POA: Diagnosis not present

## 2021-12-28 ENCOUNTER — Ambulatory Visit (INDEPENDENT_AMBULATORY_CARE_PROVIDER_SITE_OTHER): Payer: BC Managed Care – PPO | Admitting: Emergency Medicine

## 2021-12-28 ENCOUNTER — Encounter: Payer: Self-pay | Admitting: Emergency Medicine

## 2021-12-28 VITALS — BP 130/70 | HR 64 | Temp 98.4°F | Ht 59.0 in | Wt 221.1 lb

## 2021-12-28 DIAGNOSIS — N1831 Chronic kidney disease, stage 3a: Secondary | ICD-10-CM | POA: Diagnosis not present

## 2021-12-28 DIAGNOSIS — M1A09X1 Idiopathic chronic gout, multiple sites, with tophus (tophi): Secondary | ICD-10-CM | POA: Diagnosis not present

## 2021-12-28 DIAGNOSIS — R7303 Prediabetes: Secondary | ICD-10-CM

## 2021-12-28 DIAGNOSIS — I10 Essential (primary) hypertension: Secondary | ICD-10-CM | POA: Diagnosis not present

## 2021-12-28 DIAGNOSIS — Z8739 Personal history of other diseases of the musculoskeletal system and connective tissue: Secondary | ICD-10-CM

## 2021-12-28 DIAGNOSIS — E785 Hyperlipidemia, unspecified: Secondary | ICD-10-CM

## 2021-12-28 LAB — LIPID PANEL
Cholesterol: 135 mg/dL (ref 0–200)
HDL: 53.9 mg/dL (ref >39.00)
LDL Cholesterol: 60 mg/dL (ref 0–99)
NonHDL: 80.67
Total CHOL/HDL Ratio: 2
Triglycerides: 103 mg/dL (ref 0.0–149.0)
VLDL: 20.6 mg/dL (ref 0.0–40.0)

## 2021-12-28 LAB — URIC ACID: Uric Acid, Serum: 5.9 mg/dL (ref 2.4–7.0)

## 2021-12-28 LAB — COMPREHENSIVE METABOLIC PANEL
ALT: 32 U/L (ref 0–35)
AST: 33 U/L (ref 0–37)
Albumin: 4.4 g/dL (ref 3.5–5.2)
Alkaline Phosphatase: 86 U/L (ref 39–117)
BUN: 15 mg/dL (ref 6–23)
CO2: 29 mEq/L (ref 19–32)
Calcium: 9.7 mg/dL (ref 8.4–10.5)
Chloride: 104 mEq/L (ref 96–112)
Creatinine, Ser: 0.83 mg/dL (ref 0.40–1.20)
GFR: 76.53 mL/min (ref >60.00)
Glucose, Bld: 94 mg/dL (ref 70–99)
Potassium: 4.2 mEq/L (ref 3.5–5.1)
Sodium: 138 mEq/L (ref 135–145)
Total Bilirubin: 1 mg/dL (ref 0.2–1.2)
Total Protein: 7.7 g/dL (ref 6.0–8.3)

## 2021-12-28 LAB — HEMOGLOBIN A1C: Hgb A1c MFr Bld: 5.8 % (ref 4.6–6.5)

## 2021-12-28 NOTE — Assessment & Plan Note (Signed)
Well-controlled hypertension. Continue daily Aldactone 50 mg, and olmesartan 40 mg daily. Cardiovascular risks associated with hypertension discussed. BP Readings from Last 3 Encounters:  12/28/21 138/76  07/27/21 136/72  06/15/21 132/72

## 2021-12-28 NOTE — Assessment & Plan Note (Signed)
Stable.  Diet and nutrition discussed. Advised to decrease amount of daily carbohydrate intake and daily calories and increase amount of plant based protein in her diet. Continue rosuvastatin 10 mg daily. The 10-year ASCVD risk score (Arnett DK, et al., 2019) is: 6.5%   Values used to calculate the score:     Age: 61 years     Sex: Female     Is Non-Hispanic African American: Yes     Diabetic: No     Tobacco smoker: No     Systolic Blood Pressure: 138 mmHg     Is BP treated: Yes     HDL Cholesterol: 54.6 mg/dL     Total Cholesterol: 150 mg/dL

## 2021-12-28 NOTE — Assessment & Plan Note (Signed)
Advised to stay well-hydrated and avoid NSAIDs. ?

## 2021-12-28 NOTE — Assessment & Plan Note (Signed)
Stable.  Diet and nutrition discussed.  Hemoglobin A1c done today. Advised to decrease amount of daily carbohydrate intake and daily calories and increase amount of plant based protein in her diet.

## 2021-12-28 NOTE — Patient Instructions (Signed)

## 2021-12-28 NOTE — Progress Notes (Signed)
Samantha Pearson 61 y.o.   Chief Complaint  Patient presents with   Follow-up    f/u appt, no concerns     HISTORY OF PRESENT ILLNESS: This is a 61 y.o. female A1A here for 66-month follow-up of chronic medical problems. Doing well.  Has no complaints or medical concerns today. States she is still struggling with her weight. Wt Readings from Last 3 Encounters:  12/28/21 221 lb 2 oz (100.3 kg)  07/27/21 230 lb 4 oz (104.4 kg)  06/15/21 224 lb 6 oz (101.8 kg)     HPI   Prior to Admission medications   Medication Sig Start Date End Date Taking? Authorizing Provider  allopurinol (ZYLOPRIM) 100 MG tablet Take 100 mg by mouth daily.   Yes [provider]  ipratropium (ATROVENT) 0.03 % nasal spray USE 2 SPRAYS INTO BOTH NOSTRILS EVERY 12 HOURS 07/24/21  Yes Jaeda Bruso, Eilleen Kempf, MD  NON FORMULARY Oregano oil   Yes [provider]  olmesartan (BENICAR) 40 MG tablet TAKE 1 TABLET BY MOUTH EVERY DAY 09/20/21  Yes Keeghan Bialy, Eilleen Kempf, MD  rosuvastatin (CRESTOR) 10 MG tablet TAKE 1 TABLET BY MOUTH EVERY DAY 10/18/21  Yes Keegan Ducey, Eilleen Kempf, MD  sodium chloride (OCEAN) 0.65 % SOLN nasal spray Place 1 spray into both nostrils as needed for congestion. 05/23/20  Yes Roxy Horseman, PA-C  spironolactone (ALDACTONE) 50 MG tablet Take 1 tablet (50 mg total) by mouth daily. 06/15/21 06/10/22 Yes Raeqwon Lux, Eilleen Kempf, MD  Turmeric POWD 1,950 mg by Does not apply route 3 (three) times daily.   Yes [provider]  diclofenac Sodium (VOLTAREN) 1 % GEL Apply 2 g topically 4 (four) times daily. Patient not taking: Reported on 12/28/2021 02/03/20   Just, Azalee Course, FNP    Allergies  Allergen Reactions   Chlorthalidone Other (See Comments)    Severe gout   Doxycycline Hives, Itching and Swelling   Norvasc [Amlodipine Besylate] Swelling    Feet swelling   Septra [Bactrim] Hives, Itching and Swelling   Latex Swelling and Rash    Patient Active Problem List    Diagnosis Date Noted   History of gout 07/27/2021   Prediabetes 07/27/2021   Seasonal allergies 06/15/2021   Dyslipidemia 10/20/2020   Stage 3a chronic kidney disease (HCC) 10/20/2020   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   Morbid obesity (HCC) 08/18/2013   Essential hypertension 08/07/2008    Past Medical History:  Diagnosis Date   Allergy    Anxiety    Arthritis    Chest pain 08/03/08   Urgent care visit   Depression    Dyspnea    Edema    Heart murmur    Hypertension    Obesity     Past Surgical History:  Procedure Laterality Date   CESAREAN SECTION  04/16/1980   DILATION AND CURETTAGE OF UTERUS     laparoscopic    Social History   Socioeconomic History   Marital status: Single    Spouse name: Not on file   Number of children: 1   Years of education: Not on file   Highest education level: Not on file  Occupational History   Occupation: 3 jobs    Comment: Education officer, environmental and textile  Tobacco Use   Smoking status: Never   Smokeless tobacco: Never   Tobacco comments:    Nonsmoker  Substance and Sexual Activity   Alcohol use: No    Alcohol/week: 0.0 standard drinks of alcohol  Drug use: No   Sexual activity: Yes    Birth control/protection: None  Other Topics Concern   Not on file  Social History Narrative   Lives with mother   Marital status: no    Children: 1 daughter   Grandchildren - 2 (granddaughter and grandson)   Lives with: alone   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   Tobacco:  none   Alcohol:  none   Drugs:  none   Exercise:  Active job   Seatbelt: 100%   Guns in home: none      Filed for bankruptcy in 2010      Social Determinants of Health   Financial Resource Strain: Not on file  Food Insecurity: Not on file  Transportation Needs: Not on file  Physical Activity: Not on file  Stress: Not on file  Social Connections: Not on file  Intimate Partner Violence: Not  on file    Family History  Problem Relation Age of Onset   Prostate cancer Father    Congestive Heart Failure Father    Hypertension Father    Colon cancer Maternal Uncle    Colon polyps Mother    Breast cancer Mother 46       Left   Hyperlipidemia Mother    Hypertension Mother    Hypertension Sister    Hypertension Brother    Hypertension Sister    Hypertension Sister    Hypertension Sister    Hyperlipidemia Maternal Grandmother    Hypertension Maternal Grandmother    Esophageal cancer Neg Hx    Stomach cancer Neg Hx    Rectal cancer Neg Hx      Review of Systems  Constitutional: Negative.  Negative for fever.  HENT: Negative.  Negative for congestion and sore throat.   Respiratory: Negative.  Negative for cough and shortness of breath.   Cardiovascular: Negative.  Negative for chest pain and palpitations.  Gastrointestinal: Negative.  Negative for abdominal pain, nausea and vomiting.  Genitourinary: Negative.   Skin: Negative.  Negative for rash.  Neurological: Negative.  Negative for dizziness and headaches.  All other systems reviewed and are negative.   Today's Vitals   12/28/21 0946  BP: 138/76  Pulse: 64  Temp: 98.4 F (36.9 C)  TempSrc: Oral  SpO2: 97%  Weight: 221 lb 2 oz (100.3 kg)  Height: 4\' 11"  (1.499 m)   Body mass index is 44.66 kg/m.  Physical Exam Vitals reviewed.  Constitutional:      Appearance: Normal appearance.  HENT:     Head: Normocephalic.     Mouth/Throat:     Mouth: Mucous membranes are moist.     Pharynx: Oropharynx is clear.  Eyes:     Extraocular Movements: Extraocular movements intact.     Pupils: Pupils are equal, round, and reactive to light.  Cardiovascular:     Rate and Rhythm: Normal rate and regular rhythm.     Pulses: Normal pulses.     Heart sounds: Normal heart sounds.  Pulmonary:     Effort: Pulmonary effort is normal.     Breath sounds: Normal breath sounds.  Musculoskeletal:     Cervical back: No  tenderness.  Lymphadenopathy:     Cervical: No cervical adenopathy.  Skin:    General: Skin is warm and dry.  Neurological:     General: No focal deficit present.     Mental Status: She is alert and oriented to person, place, and  time.  Psychiatric:        Mood and Affect: Mood normal.        Behavior: Behavior normal.      ASSESSMENT & PLAN: A total of 47 minutes was spent with the patient and counseling/coordination of care regarding preparing for this visit, review of most recent office visit notes, review of multiple chronic medical problems and their management, review of all medications, cardiovascular risks associated with hypertension and dyslipidemia, education on nutrition, review of health maintenance items, prognosis, review of most recent blood work results, documentation, and need for follow-up.  Problem List Items Addressed This Visit       Cardiovascular and Mediastinum   Essential hypertension - Primary    Well-controlled hypertension. Continue daily Aldactone 50 mg, and olmesartan 40 mg daily. Cardiovascular risks associated with hypertension discussed. BP Readings from Last 3 Encounters:  12/28/21 138/76  07/27/21 136/72  06/15/21 132/72        Relevant Orders   Comprehensive metabolic panel     Musculoskeletal and Integument   Idiopathic chronic gout of multiple sites with tophus    Stable.  No recent flareups. Continue allopurinol 100 mg daily.      Relevant Orders   Uric acid     Genitourinary   Stage 3a chronic kidney disease (HCC)    Advised to stay well-hydrated and avoid NSAIDs.        Other   Dyslipidemia    Stable.  Diet and nutrition discussed. Advised to decrease amount of daily carbohydrate intake and daily calories and increase amount of plant based protein in her diet. Continue rosuvastatin 10 mg daily. The 10-year ASCVD risk score (Arnett DK, et al., 2019) is: 6.5%   Values used to calculate the score:     Age: 89 years      Sex: Female     Is Non-Hispanic African American: Yes     Diabetic: No     Tobacco smoker: No     Systolic Blood Pressure: 138 mmHg     Is BP treated: Yes     HDL Cholesterol: 54.6 mg/dL     Total Cholesterol: 150 mg/dL       Relevant Orders   Lipid panel   History of gout   Relevant Orders   Uric acid   Prediabetes    Stable.  Diet and nutrition discussed.  Hemoglobin A1c done today. Advised to decrease amount of daily carbohydrate intake and daily calories and increase amount of plant based protein in her diet.      Relevant Orders   Hemoglobin A1c   Patient Instructions  Health Maintenance, Female Adopting a healthy lifestyle and getting preventive care are important in promoting health and wellness. Ask your health care provider about: The right schedule for you to have regular tests and exams. Things you can do on your own to prevent diseases and keep yourself healthy. What should I know about diet, weight, and exercise? Eat a healthy diet  Eat a diet that includes plenty of vegetables, fruits, low-fat dairy products, and lean protein. Do not eat a lot of foods that are high in solid fats, added sugars, or sodium. Maintain a healthy weight Body mass index (BMI) is used to identify weight problems. It estimates body fat based on height and weight. Your health care provider can help determine your BMI and help you achieve or maintain a healthy weight. Get regular exercise Get regular exercise. This is one of the most important things  you can do for your health. Most adults should: Exercise for at least 150 minutes each week. The exercise should increase your heart rate and make you sweat (moderate-intensity exercise). Do strengthening exercises at least twice a week. This is in addition to the moderate-intensity exercise. Spend less time sitting. Even light physical activity can be beneficial. Watch cholesterol and blood lipids Have your blood tested for lipids and  cholesterol at 59 years of age, then have this test every 5 years. Have your cholesterol levels checked more often if: Your lipid or cholesterol levels are high. You are older than 60 years of age. You are at high risk for heart disease. What should I know about cancer screening? Depending on your health history and family history, you may need to have cancer screening at various ages. This may include screening for: Breast cancer. Cervical cancer. Colorectal cancer. Skin cancer. Lung cancer. What should I know about heart disease, diabetes, and high blood pressure? Blood pressure and heart disease High blood pressure causes heart disease and increases the risk of stroke. This is more likely to develop in people who have high blood pressure readings or are overweight. Have your blood pressure checked: Every 3-5 years if you are 41-60 years of age. Every year if you are 31 years old or older. Diabetes Have regular diabetes screenings. This checks your fasting blood sugar level. Have the screening done: Once every three years after age 54 if you are at a normal weight and have a low risk for diabetes. More often and at a younger age if you are overweight or have a high risk for diabetes. What should I know about preventing infection? Hepatitis B If you have a higher risk for hepatitis B, you should be screened for this virus. Talk with your health care provider to find out if you are at risk for hepatitis B infection. Hepatitis C Testing is recommended for: Everyone born from [birth year] through 1964. Anyone with known risk factors for hepatitis C. Sexually transmitted infections (STIs) Get screened for STIs, including gonorrhea and chlamydia, if: You are sexually active and are younger than 60 years of age. You are older than 60 years of age and your health care provider tells you that you are at risk for this type of infection. Your sexual activity has changed since you were last screened,  and you are at increased risk for chlamydia or gonorrhea. Ask your health care provider if you are at risk. Ask your health care provider about whether you are at high risk for HIV. Your health care provider may recommend a prescription medicine to help prevent HIV infection. If you choose to take medicine to prevent HIV, you should first get tested for HIV. You should then be tested every 3 months for as long as you are taking the medicine. Pregnancy If you are about to stop having your period (premenopausal) and you may become pregnant, seek counseling before you get pregnant. Take 400 to 800 micrograms (mcg) of folic acid every day if you become pregnant. Ask for birth control (contraception) if you want to prevent pregnancy. Osteoporosis and menopause Osteoporosis is a disease in which the bones lose minerals and strength with aging. This can result in bone fractures. If you are 21 years old or older, or if you are at risk for osteoporosis and fractures, ask your health care provider if you should: Be screened for bone loss. Take a calcium or vitamin D supplement to lower your risk of fractures.  Be given hormone replacement therapy (HRT) to treat symptoms of menopause. Follow these instructions at home: Alcohol use Do not drink alcohol if: Your health care provider tells you not to drink. You are pregnant, may be pregnant, or are planning to become pregnant. If you drink alcohol: Limit how much you have to: 0-1 drink a day. Know how much alcohol is in your drink. In the [place] one drink equals one 12 oz bottle of beer (355 mL), one 5 oz glass of wine (148 mL), or one 1 oz glass of hard liquor (44 mL). Lifestyle Do not use any products that contain nicotine or tobacco. These products include cigarettes, chewing tobacco, and vaping devices, such as e-cigarettes. If you need help quitting, ask your health care provider. Do not use street drugs. Do not share needles. Ask your health care  provider for help if you need support or information about quitting drugs. General instructions Schedule regular health, dental, and eye exams. Stay current with your vaccines. Tell your health care provider if: You often feel depressed. You have ever been abused or do not feel safe at home. Summary Adopting a healthy lifestyle and getting preventive care are important in promoting health and wellness. Follow your health care provider's instructions about healthy diet, exercising, and getting tested or screened for diseases. Follow your health care provider's instructions on monitoring your cholesterol and blood pressure. This information is not intended to replace advice given to you by your health care provider. Make sure you discuss any questions you have with your health care provider. Document Revised: 06/14/2020 Document Reviewed: 06/14/2020 Elsevier Patient Education  2023 Elsevier Inc.    Edwina Barth, MD [facility]

## 2021-12-28 NOTE — Assessment & Plan Note (Signed)
Stable.  No recent flareups. Continue allopurinol 100 mg daily. 

## 2022-02-01 DIAGNOSIS — J019 Acute sinusitis, unspecified: Secondary | ICD-10-CM | POA: Diagnosis not present

## 2022-02-01 DIAGNOSIS — Z20822 Contact with and (suspected) exposure to covid-19: Secondary | ICD-10-CM | POA: Diagnosis not present

## 2022-02-02 ENCOUNTER — Ambulatory Visit: Payer: BC Managed Care – PPO | Admitting: Emergency Medicine

## 2022-02-07 DIAGNOSIS — M109 Gout, unspecified: Secondary | ICD-10-CM | POA: Diagnosis not present

## 2022-02-07 DIAGNOSIS — I10 Essential (primary) hypertension: Secondary | ICD-10-CM | POA: Diagnosis not present

## 2022-02-07 DIAGNOSIS — M1990 Unspecified osteoarthritis, unspecified site: Secondary | ICD-10-CM | POA: Diagnosis not present

## 2022-03-30 DIAGNOSIS — J019 Acute sinusitis, unspecified: Secondary | ICD-10-CM | POA: Diagnosis not present

## 2022-03-30 DIAGNOSIS — R040 Epistaxis: Secondary | ICD-10-CM | POA: Diagnosis not present

## 2022-06-16 ENCOUNTER — Encounter (HOSPITAL_BASED_OUTPATIENT_CLINIC_OR_DEPARTMENT_OTHER): Payer: Self-pay | Admitting: Cardiology

## 2022-06-16 ENCOUNTER — Ambulatory Visit (INDEPENDENT_AMBULATORY_CARE_PROVIDER_SITE_OTHER): Payer: BC Managed Care – PPO | Admitting: Cardiology

## 2022-06-16 VITALS — BP 124/62 | HR 71

## 2022-06-16 DIAGNOSIS — Z7189 Other specified counseling: Secondary | ICD-10-CM | POA: Diagnosis not present

## 2022-06-16 DIAGNOSIS — R002 Palpitations: Secondary | ICD-10-CM | POA: Diagnosis not present

## 2022-06-16 DIAGNOSIS — I10 Essential (primary) hypertension: Secondary | ICD-10-CM | POA: Diagnosis not present

## 2022-06-16 DIAGNOSIS — R079 Chest pain, unspecified: Secondary | ICD-10-CM

## 2022-06-16 NOTE — Patient Instructions (Signed)
Medication Instructions:  Your physician recommends that you continue on your current medications as directed. Please refer to the Current Medication list given to you today.   *If you need a refill on your cardiac medications before your next appointment, please call your pharmacy*  Lab Work: NONE   Testing/Procedures: NONE   Follow-Up: At  [facility], you and your health needs are our priority.  As part of our continuing mission to provide you with exceptional heart care, we have created designated Provider Care Teams.  These Care Teams include your primary Cardiologist (physician) and Advanced Practice Providers (APPs -  Physician Assistants and Nurse Practitioners) who all work together to provide you with the care you need, when you need it.  We recommend signing up for the patient portal called "MyChart".  Sign up information is provided on this After Visit Summary.  MyChart is used to connect with patients for Virtual Visits (Telemedicine).  Patients are able to view lab/test results, encounter notes, upcoming appointments, etc.  Non-urgent messages can be sent to your provider as well.   To learn more about what you can do with MyChart, go to [email or web address].    Your next appointment:   3 month(s)  The format for your next appointment:   In Person  Provider:   Bridgette Christopher, MD              

## 2022-06-16 NOTE — Progress Notes (Signed)
Cardiology Office Note:    Date:  06/16/2022   ID:  Samantha Pearson, Samantha Pearson 10-07-1960, MRN [id number]  PCP:  Georgina Quint, MD  Cardiologist: Jodelle Red, MD PhD  Referring MD: Georgina Quint, *   CC: follow up  History of Present Illness:    Samantha Pearson is a 62 y.o. female with a hx of hypertension and obesity who is seen in follow up today.   Echo done 08/2017 showed mild diastolic dysfunction. Declined sleep study, not covered for in lab but could do home sleep study, but had out of pocket cost.   Medication history: Tried chlorthalidone, caused severe gout. Has had foot swelling on amlodipine in the past, doesn't tolerate.   The day prior to her last visit she had seen her PCP who increased her spironolactone to 50 mg rather than begin a third blood pressure medication. Spironolactone had previously been decreased to 25 mg due to elevated potassium levels. Monitoring labs and response with Dr. Alvy Bimler. She had been trying to contact a nutritionist. Counseled on lifestyle, diet, exercise. We did discuss GLP1RA for weight loss, she planned to think about it.   Today, she states she has been feeling better since a few months ago when she was under significant stress. She had noticed that her heart beat felt irregular and elevated, so she was fearful of having a heart attack or stroke. Usually she would try to take deep breaths.   Additionally during her stressful months she suffered a series of episodic epistaxis ("coming out like water")that she felt was secondary to hypertension. She could feel that her blood pressure was high at those times. Also she had associated chest pain. She wasn't sleeping well, and she would sometimes wake up with her chest hurting, or her nose would already be bleeding. No recent epistaxis, however she did have a more recent mild episode of chest pain.  Since her last move months ago, she has been stressed with unpacking boxes and  sometimes feels overwhelmed. She is working two jobs, an 8 hour job and a 1-1.5 hour part time job. 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   She denies any shortness of breath, or peripheral edema. No lightheadedness, headaches, syncope, orthopnea, or PND.   Past Medical History:  Diagnosis Date   Allergy    Anxiety    Arthritis    Chest pain 08/03/08   Urgent care visit   Depression    Dyspnea    Edema    Heart murmur    Hypertension    Obesity     Past Surgical History:  Procedure Laterality Date   CESAREAN SECTION  04/16/1980   DILATION AND CURETTAGE OF UTERUS     laparoscopic    Current Medications: Current Outpatient Medications on File Prior to Visit  Medication Sig   allopurinol (ZYLOPRIM) 100 MG tablet Take 100 mg by mouth daily.   diclofenac Sodium (VOLTAREN) 1 % GEL Apply 2 g topically 4 (four) times daily.   ipratropium (ATROVENT) 0.03 % nasal spray USE 2 SPRAYS INTO BOTH NOSTRILS EVERY 12 HOURS   NON FORMULARY Oregano oil   olmesartan (BENICAR) 40 MG tablet TAKE 1 TABLET BY MOUTH EVERY DAY   rosuvastatin (CRESTOR) 10 MG tablet TAKE 1 TABLET BY MOUTH EVERY DAY   sodium chloride (OCEAN) 0.65 % SOLN nasal spray Place 1 spray into both nostrils as needed  for congestion.   spironolactone (ALDACTONE) 50 MG tablet Take 1 tablet (50 mg total) by mouth daily.   Turmeric POWD 1,950 mg by Does not apply route 3 (three) times daily.   No current facility-administered medications on file prior to visit.     Allergies:   Chlorthalidone, Doxycycline, Norvasc [amlodipine besylate], Septra [bactrim], and Latex   Social History   Tobacco Use   Smoking status: Never   Smokeless tobacco: Never   Tobacco comments:    Nonsmoker  Substance Use Topics   Alcohol use: No    Alcohol/week: 0.0 standard drinks of alcohol   Drug use: No   Family History: The patient's family history includes Breast  cancer (age of onset: 34) in her mother; Colon cancer in her maternal uncle; Colon polyps in her mother; Congestive Heart Failure in her father; Hyperlipidemia in her maternal grandmother and mother; Hypertension in her brother, father, maternal grandmother, mother, sister, sister, sister, and sister; Prostate cancer in her father. There is no history of Esophageal cancer, Stomach cancer, or Rectal cancer.   ROS:   Please see the history of present illness.   (+) Palpitations (+) Intermittent chest pain (+) Epistaxis, seems to be more resolved (+) Stress Additional pertinent ROS otherwise unremarkable.  EKGs/Labs/Other Studies Reviewed:    The following studies were personally reviewed today:  Echo 08/30/17 - Left ventricle: The cavity size was normal. Wall thickness was   normal. Systolic function was normal. The estimated ejection   fraction was in the range of 50% to 55%. Wall motion was normal;   there were no regional wall motion abnormalities. Doppler   parameters are consistent with abnormal left ventricular   relaxation (grade 1 diastolic dysfunction).   Impressions:  - Normal LV systolic function; mild diastolic dysfunction.  EKG:  EKG is personally reviewed today.   06/16/2022:  NSR at 71 bpm 07/16/2020: EKG was not ordered 01/14/2020: normal sinus rhythm at 77 bpm  Recent Labs: 12/28/2021: ALT 32; BUN 15; Creatinine, Ser 0.83; Potassium 4.2; Sodium 138   Recent Lipid Panel    Component Value Date/Time   CHOL 135 12/28/2021 1023   CHOL 192 01/20/2020 1104   TRIG 103.0 12/28/2021 1023   HDL 53.90 12/28/2021 1023   HDL 58 01/20/2020 1104   CHOLHDL 2 12/28/2021 1023   VLDL 20.6 12/28/2021 1023   LDLCALC 60 12/28/2021 1023   LDLCALC 108 (H) 01/20/2020 1104    Physical Exam:    VS:  BP 124/62 (BP Location: Left Arm, Patient Position: Sitting, Cuff Size: Large)   Pulse 71   LMP 04/09/2014     Wt Readings from Last 3 Encounters:  12/28/21 221 lb 2 oz (100.3 kg)   07/27/21 230 lb 4 oz (104.4 kg)  06/15/21 224 lb 6 oz (101.8 kg)    GEN: Well nourished, well developed in no acute distress HEENT: Normal, moist mucous membranes NECK: No JVD CARDIAC: regular rhythm, normal S1 and S2, no rubs or gallops. No murmur. VASCULAR: Radial and DP pulses 2+ bilaterally. No carotid bruits RESPIRATORY:  Clear to auscultation without rales, wheezing or rhonchi  ABDOMEN: Soft, non-tender, non-distended MUSCULOSKELETAL:  Ambulates independently SKIN: Warm and dry, no edema NEUROLOGIC:  Alert and oriented x 3. No focal neuro deficits noted. PSYCHIATRIC:  Normal affect   ASSESSMENT:    1. Essential hypertension   2. Heart palpitations   3. Intermittent chest pain   4. Cardiac risk counseling     PLAN:    Palpitations  Chest pain, intermittent -at times of high stress several months ago -she will contact me if these return or worsen -reviewed both cardiac and noncardiac potential causes of symptoms -reviewed red flag warning signs that need immediate medical attention  Hypertension: goal of <130/80.  -at goal today -severe gout on chlorthalidone, swelling on amlodipine -continue spironolactone and olmesartan  Class 3 obesity counseled on lifestyle, diet, exercise today. She plans to focus on this to avoid additional BP medications. We did discuss GLP1RA for weight loss today, she will think about it.  Prevention and health counseling: -recommend heart healthy/Mediterranean diet, with whole grains, fruits, vegetable, fish, lean meats, nuts, and olive oil. Limit salt. -recommend moderate walking, 3-5 times/week for 30-50 minutes each session. Aim for at least 150 minutes.week. Goal should be pace of 3 miles/hours, or walking 1.5 miles in 30 minutes -recommend avoidance of tobacco products. Avoid excess alcohol. -ASCVD risk score: The 10-year ASCVD risk score (Arnett DK, et al., 2019) is: 4.7%   Values used to calculate the score:     Age: 23 years      Sex: Female     Is Non-Hispanic African American: Yes     Diabetic: No     Tobacco smoker: No     Systolic Blood Pressure: 124 mmHg     Is BP treated: Yes     HDL Cholesterol: 53.9 mg/dL     Total Cholesterol: 135 mg/dL   Plan for Follow up:  3 months or sooner as needed.  I,Mathew Stumpf,acting as a Neurosurgeon for [employer], MD.,have documented all relevant documentation on the behalf of Jodelle Red, MD,as directed by  Jodelle Red, MD while in the presence of Jodelle Red, MD.  I, Jodelle Red, MD, have reviewed all documentation for this visit. The documentation on 06/16/22 for the exam, diagnosis, procedures, and orders are all accurate and complete.   Signed, Jodelle Red, MD PhD 06/16/2022     [facility]

## 2022-06-28 ENCOUNTER — Encounter: Payer: Self-pay | Admitting: Emergency Medicine

## 2022-06-28 ENCOUNTER — Ambulatory Visit (INDEPENDENT_AMBULATORY_CARE_PROVIDER_SITE_OTHER): Payer: BC Managed Care – PPO | Admitting: Emergency Medicine

## 2022-06-28 VITALS — BP 134/72 | HR 68 | Temp 98.5°F | Ht 59.0 in | Wt 223.2 lb

## 2022-06-28 DIAGNOSIS — R7303 Prediabetes: Secondary | ICD-10-CM

## 2022-06-28 DIAGNOSIS — M1A09X1 Idiopathic chronic gout, multiple sites, with tophus (tophi): Secondary | ICD-10-CM

## 2022-06-28 DIAGNOSIS — I10 Essential (primary) hypertension: Secondary | ICD-10-CM

## 2022-06-28 DIAGNOSIS — N1831 Chronic kidney disease, stage 3a: Secondary | ICD-10-CM | POA: Diagnosis not present

## 2022-06-28 DIAGNOSIS — E785 Hyperlipidemia, unspecified: Secondary | ICD-10-CM

## 2022-06-28 LAB — CBC WITH DIFFERENTIAL/PLATELET
Basophils Absolute: 0 10*3/uL (ref 0.0–0.1)
Basophils Relative: 0.2 % (ref 0.0–3.0)
Eosinophils Absolute: 0.2 10*3/uL (ref 0.0–0.7)
Eosinophils Relative: 3.9 % (ref 0.0–5.0)
HCT: 37 % (ref 36.0–46.0)
Hemoglobin: 11.7 g/dL — ABNORMAL LOW (ref 12.0–15.0)
Lymphocytes Relative: 41.8 % (ref 12.0–46.0)
Lymphs Abs: 2.4 10*3/uL (ref 0.7–4.0)
MCHC: 31.7 g/dL (ref 30.0–36.0)
MCV: 85.3 fl (ref 78.0–100.0)
Monocytes Absolute: 0.4 10*3/uL (ref 0.1–1.0)
Monocytes Relative: 6.4 % (ref 3.0–12.0)
Neutro Abs: 2.7 10*3/uL (ref 1.4–7.7)
Neutrophils Relative %: 47.7 % (ref 43.0–77.0)
Platelets: 249 10*3/uL (ref 150.0–400.0)
RBC: 4.34 Mil/uL (ref 3.87–5.11)
RDW: 14.3 % (ref 11.5–15.5)
WBC: 5.7 10*3/uL (ref 4.0–10.5)

## 2022-06-28 LAB — COMPREHENSIVE METABOLIC PANEL
ALT: 21 U/L (ref 0–35)
AST: 26 U/L (ref 0–37)
Albumin: 4.3 g/dL (ref 3.5–5.2)
Alkaline Phosphatase: 79 U/L (ref 39–117)
BUN: 13 mg/dL (ref 6–23)
CO2: 28 mEq/L (ref 19–32)
Calcium: 9.9 mg/dL (ref 8.4–10.5)
Chloride: 104 mEq/L (ref 96–112)
Creatinine, Ser: 0.95 mg/dL (ref 0.40–1.20)
GFR: 64.86 mL/min (ref >60.00)
Glucose, Bld: 83 mg/dL (ref 70–99)
Potassium: 3.9 mEq/L (ref 3.5–5.1)
Sodium: 140 mEq/L (ref 135–145)
Total Bilirubin: 1.2 mg/dL (ref 0.2–1.2)
Total Protein: 7.4 g/dL (ref 6.0–8.3)

## 2022-06-28 LAB — LIPID PANEL
Cholesterol: 136 mg/dL (ref 0–200)
HDL: 55.1 mg/dL (ref >39.00)
LDL Cholesterol: 61 mg/dL (ref 0–99)
NonHDL: 80.65
Total CHOL/HDL Ratio: 2
Triglycerides: 96 mg/dL (ref 0.0–149.0)
VLDL: 19.2 mg/dL (ref 0.0–40.0)

## 2022-06-28 LAB — HEMOGLOBIN A1C: Hgb A1c MFr Bld: 5.9 % (ref 4.6–6.5)

## 2022-06-28 LAB — URIC ACID: Uric Acid, Serum: 5.1 mg/dL (ref 2.4–7.0)

## 2022-06-28 MED ORDER — SPIRONOLACTONE 50 MG PO TABS: 50.0000 mg | ORAL_TABLET | Freq: Every day | ORAL | 3 refills | Status: DC

## 2022-06-28 NOTE — Assessment & Plan Note (Signed)
No recent flareups Continues on allopurinol 100 mg daily Uric acid done today

## 2022-06-28 NOTE — Assessment & Plan Note (Signed)
Diet and nutrition discussed. Advised to decrease amount of daily carbohydrate intake and daily calories and increase amount of plant based protein in her diet Benefits of exercise discussed. 

## 2022-06-28 NOTE — Patient Instructions (Signed)

## 2022-06-28 NOTE — Assessment & Plan Note (Signed)
Diet and nutrition discussed Chronic stable condition Lipid profile done today Continue rosuvastatin 10 mg daily The 10-year ASCVD risk score (Arnett DK, et al., 2019) is: 5.8%   Values used to calculate the score:     Age: 62 years     Sex: Female     Is Non-Hispanic African American: Yes     Diabetic: No     Tobacco smoker: No     Systolic Blood Pressure: 134 mmHg     Is BP treated: Yes     HDL Cholesterol: 53.9 mg/dL     Total Cholesterol: 135 mg/dL

## 2022-06-28 NOTE — Progress Notes (Signed)
Samantha Pearson 62 y.o.   Chief Complaint  Patient presents with   Medical Management of Chronic Issues    f/u appt, patient has been having some issues with her digrestive system ,  severe diarrhea about a few weeks ago     HISTORY OF PRESENT ILLNESS: This is a 62 y.o. female here for 59-month follow-up of chronic medical conditions including hypertension Had episode of diarrhea couple weeks ago.  Better now Occasional constipation No other complaints or medical concerns today. Wt Readings from Last 3 Encounters:  06/28/22 223 lb 4 oz (101.3 kg)  12/28/21 221 lb 2 oz (100.3 kg)  07/27/21 230 lb 4 oz (104.4 kg)     Constipation Pertinent negatives include no abdominal pain, fever, nausea or vomiting.     Prior to Admission medications   Medication Sig Start Date End Date Taking? Authorizing Provider  allopurinol (ZYLOPRIM) 100 MG tablet Take 100 mg by mouth daily.   Yes [provider]  diclofenac Sodium (VOLTAREN) 1 % GEL Apply 2 g topically 4 (four) times daily. 02/03/20  Yes Just, Azalee Course, FNP  ipratropium (ATROVENT) 0.03 % nasal spray USE 2 SPRAYS INTO BOTH NOSTRILS EVERY 12 HOURS 07/24/21  Yes Carmell Elgin, Eilleen Kempf, MD  NON FORMULARY Oregano oil   Yes [provider]  olmesartan (BENICAR) 40 MG tablet TAKE 1 TABLET BY MOUTH EVERY DAY 09/20/21  Yes Lutisha Knoche, Eilleen Kempf, MD  rosuvastatin (CRESTOR) 10 MG tablet TAKE 1 TABLET BY MOUTH EVERY DAY 10/18/21  Yes Fallynn Gravett, Eilleen Kempf, MD  sodium chloride (OCEAN) 0.65 % SOLN nasal spray Place 1 spray into both nostrils as needed for congestion. 05/23/20  Yes Roxy Horseman, PA-C  Turmeric POWD 1,950 mg by Does not apply route 3 (three) times daily.   Yes [provider]  spironolactone (ALDACTONE) 50 MG tablet Take 1 tablet (50 mg total) by mouth daily. 06/15/21 06/16/22  Georgina Quint, MD    Allergies  Allergen Reactions   Chlorthalidone Other (See Comments)    Severe gout   Doxycycline  Hives, Itching and Swelling   Norvasc [Amlodipine Besylate] Swelling    Feet swelling   Septra [Bactrim] Hives, Itching and Swelling   Latex Swelling and Rash    Patient Active Problem List   Diagnosis Date Noted   History of gout 07/27/2021   Prediabetes 07/27/2021   Seasonal allergies 06/15/2021   Dyslipidemia 10/20/2020   Stage 3a chronic kidney disease (HCC) 10/20/2020   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   Morbid obesity (HCC) 08/18/2013   Essential hypertension 08/07/2008    Past Medical History:  Diagnosis Date   Allergy    Anxiety    Arthritis    Chest pain 08/03/08   Urgent care visit   Depression    Dyspnea    Edema    Heart murmur    Hypertension    Obesity     Past Surgical History:  Procedure Laterality Date   CESAREAN SECTION  04/16/1980   DILATION AND CURETTAGE OF UTERUS     laparoscopic    Social History   Socioeconomic History   Marital status: Single    Spouse name: Not on file   Number of children: 1   Years of education: Not on file   Highest education level: 12th grade  Occupational History   Occupation: 3 jobs    Comment: Education officer, environmental and textile  Tobacco Use   Smoking status: Never   Smokeless tobacco:  Never   Tobacco comments:    Nonsmoker  Substance and Sexual Activity   Alcohol use: No    Alcohol/week: 0.0 standard drinks of alcohol   Drug use: No   Sexual activity: Yes    Birth control/protection: None  Other Topics Concern   Not on file  Social History Narrative   Lives with mother   Marital status: no    Children: 1 daughter   Grandchildren - 2 (granddaughter and grandson)   Lives with: alone   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   Tobacco:  none   Alcohol:  none   Drugs:  none   Exercise:  Active job   Seatbelt: 100%   Guns in home: none      Filed for bankruptcy in 2010      Social Determinants of Health   Financial Resource Strain:  Not on file  Food Insecurity: Unknown (06/27/2022)   Hunger Vital Sign    Worried About Running Out of Food in the Last Year: Never true    Ran Out of Food in the Last Year: Patient declined  Transportation Needs: No Transportation Needs (06/27/2022)   PRAPARE - Administrator, Civil Service (Medical): No    Lack of Transportation (Non-Medical): No  Physical Activity: Sufficiently Active (06/27/2022)   Exercise Vital Sign    Days of Exercise per Week: 6 days    Minutes of Exercise per Session: 150+ min  Stress: No Stress Concern Present (06/27/2022)   [employer] of Occupational Health - Occupational Stress Questionnaire    Feeling of Stress : Only a little  Social Connections: Socially Isolated (06/27/2022)   Social Connection and Isolation Panel [NHANES]    Frequency of Communication with Friends and Family: Three times a week    Frequency of Social Gatherings with Friends and Family: More than three times a week    Attends Religious Services: Never    Database administrator or Organizations: No    Attends Engineer, structural: Not on file    Marital Status: Never married  Catering manager Violence: Not on file    Family History  Problem Relation Age of Onset   Prostate cancer Father    Congestive Heart Failure Father    Hypertension Father    Colon cancer Maternal Uncle    Colon polyps Mother    Breast cancer Mother 11       Left   Hyperlipidemia Mother    Hypertension Mother    Hypertension Sister    Hypertension Brother    Hypertension Sister    Hypertension Sister    Hypertension Sister    Hyperlipidemia Maternal Grandmother    Hypertension Maternal Grandmother    Esophageal cancer Neg Hx    Stomach cancer Neg Hx    Rectal cancer Neg Hx      Review of Systems  Constitutional: Negative.  Negative for chills and fever.  HENT: Negative.  Negative for congestion and sore throat.   Respiratory: Negative.  Negative for cough and shortness of  breath.   Cardiovascular: Negative.  Negative for chest pain and palpitations.  Gastrointestinal:  Positive for constipation. Negative for abdominal pain, nausea and vomiting.  Genitourinary: Negative.  Negative for dysuria and hematuria.  Skin: Negative.  Negative for rash.  Neurological: Negative.  Negative for dizziness and headaches.  All other systems reviewed and are negative.   Today's Vitals   06/28/22 1303  BP: 134/72  Pulse: 68  Temp: 98.5 F (36.9 C)  TempSrc: Oral  SpO2: 96%  Weight: 223 lb 4 oz (101.3 kg)  Height: 4\' 11"  (1.499 m)   Body mass index is 45.09 kg/m.   Physical Exam Vitals reviewed.  Constitutional:      Appearance: She is obese.  HENT:     Head: Normocephalic.     Mouth/Throat:     Mouth: Mucous membranes are moist.     Pharynx: Oropharynx is clear.  Eyes:     Extraocular Movements: Extraocular movements intact.     Conjunctiva/sclera: Conjunctivae normal.     Pupils: Pupils are equal, round, and reactive to light.  Cardiovascular:     Rate and Rhythm: Normal rate and regular rhythm.     Pulses: Normal pulses.     Heart sounds: Normal heart sounds.  Pulmonary:     Effort: Pulmonary effort is normal.     Breath sounds: Normal breath sounds.  Abdominal:     Palpations: Abdomen is soft.     Tenderness: There is no abdominal tenderness.  Musculoskeletal:     Cervical back: No tenderness.  Lymphadenopathy:     Cervical: No cervical adenopathy.  Skin:    General: Skin is warm and dry.  Neurological:     Mental Status: She is alert and oriented to person, place, and time.  Psychiatric:        Mood and Affect: Mood normal.        Behavior: Behavior normal.      ASSESSMENT & PLAN: A total of 45 minutes was spent with the patient and counseling/coordination of care regarding preparing for this visit, review of most recent office visit notes, review of multiple chronic medical conditions and their management, review of all medications,  review of most recent blood work results, cardiovascular risks associated with hypertension, dyslipidemia, and obesity, education on nutrition, prognosis, documentation, and need for follow-up.  Problem List Items Addressed This Visit       Cardiovascular and Mediastinum   Essential hypertension - Primary    Well-controlled hypertension. Will continue olmesartan 40 mg and Aldactone 50 mg daily recognizing the possibility of hyperkalemia.  CMP done today. HCTZ has been tried in the past without success according to patient Has been on these 2 medications for a while with success. Cardiovascular risks associated with hypertension discussed Dietary approaches to stop hypertension discussed      Relevant Medications   spironolactone (ALDACTONE) 50 MG tablet   Other Relevant Orders   CBC with Differential/Platelet   Comprehensive metabolic panel   Hemoglobin A1c   Lipid panel     Musculoskeletal and Integument   Idiopathic chronic gout of multiple sites with tophus    No recent flareups Continues on allopurinol 100 mg daily Uric acid done today      Relevant Orders   Uric acid     Genitourinary   Stage 3a chronic kidney disease (HCC)    Advised to stay well-hydrated and avoid NSAIDs Will consider stopping allopurinol      Relevant Orders   Comprehensive metabolic panel     Other   Morbid obesity (HCC)    Diet and nutrition discussed Advised to decrease amount of daily carbohydrate intake and daily calories and increase amount of plant-based protein in her diet Benefits of exercise discussed      Dyslipidemia    Diet and nutrition discussed Chronic stable condition Lipid profile done today Continue rosuvastatin 10 mg daily The  10-year ASCVD risk score (Arnett DK, et al., 2019) is: 5.8%   Values used to calculate the score:     Age: 43 years     Sex: Female     Is Non-Hispanic African American: Yes     Diabetic: No     Tobacco smoker: No     Systolic Blood  Pressure: 134 mmHg     Is BP treated: Yes     HDL Cholesterol: 53.9 mg/dL     Total Cholesterol: 135 mg/dL       Prediabetes    Diet and nutrition discussed Advised to decrease amount of daily carbohydrate intake and daily calories and increase amount of plant-based protein in her diet Benefits of exercise discussed      Relevant Orders   Hemoglobin A1c   Patient Instructions  Health Maintenance, Female Adopting a healthy lifestyle and getting preventive care are important in promoting health and wellness. Ask your health care provider about: The right schedule for you to have regular tests and exams. Things you can do on your own to prevent diseases and keep yourself healthy. What should I know about diet, weight, and exercise? Eat a healthy diet  Eat a diet that includes plenty of vegetables, fruits, low-fat dairy products, and lean protein. Do not eat a lot of foods that are high in solid fats, added sugars, or sodium. Maintain a healthy weight Body mass index (BMI) is used to identify weight problems. It estimates body fat based on height and weight. Your health care provider can help determine your BMI and help you achieve or maintain a healthy weight. Get regular exercise Get regular exercise. This is one of the most important things you can do for your health. Most adults should: Exercise for at least 150 minutes each week. The exercise should increase your heart rate and make you sweat (moderate-intensity exercise). Do strengthening exercises at least twice a week. This is in addition to the moderate-intensity exercise. Spend less time sitting. Even light physical activity can be beneficial. Watch cholesterol and blood lipids Have your blood tested for lipids and cholesterol at 61 years of age, then have this test every 5 years. Have your cholesterol levels checked more often if: Your lipid or cholesterol levels are high. You are older than 61 years of age. You are at  high risk for heart disease. What should I know about cancer screening? Depending on your health history and family history, you may need to have cancer screening at various ages. This may include screening for: Breast cancer. Cervical cancer. Colorectal cancer. Skin cancer. Lung cancer. What should I know about heart disease, diabetes, and high blood pressure? Blood pressure and heart disease High blood pressure causes heart disease and increases the risk of stroke. This is more likely to develop in people who have high blood pressure readings or are overweight. Have your blood pressure checked: Every 3-5 years if you are 8-28 years of age. Every year if you are 6 years old or older. Diabetes Have regular diabetes screenings. This checks your fasting blood sugar level. Have the screening done: Once every three years after age 20 if you are at a normal weight and have a low risk for diabetes. More often and at a younger age if you are overweight or have a high risk for diabetes. What should I know about preventing infection? Hepatitis B If you have a higher risk for hepatitis B, you should be screened for this virus. Talk with your  health care provider to find out if you are at risk for hepatitis B infection. Hepatitis C Testing is recommended for: Everyone born from [birth year] through 1964. Anyone with known risk factors for hepatitis C. Sexually transmitted infections (STIs) Get screened for STIs, including gonorrhea and chlamydia, if: You are sexually active and are younger than 61 years of age. You are older than 61 years of age and your health care provider tells you that you are at risk for this type of infection. Your sexual activity has changed since you were last screened, and you are at increased risk for chlamydia or gonorrhea. Ask your health care provider if you are at risk. Ask your health care provider about whether you are at high risk for HIV. Your health care provider may  recommend a prescription medicine to help prevent HIV infection. If you choose to take medicine to prevent HIV, you should first get tested for HIV. You should then be tested every 3 months for as long as you are taking the medicine. Pregnancy If you are about to stop having your period (premenopausal) and you may become pregnant, seek counseling before you get pregnant. Take 400 to 800 micrograms (mcg) of folic acid every day if you become pregnant. Ask for birth control (contraception) if you want to prevent pregnancy. Osteoporosis and menopause Osteoporosis is a disease in which the bones lose minerals and strength with aging. This can result in bone fractures. If you are 29 years old or older, or if you are at risk for osteoporosis and fractures, ask your health care provider if you should: Be screened for bone loss. Take a calcium or vitamin D supplement to lower your risk of fractures. Be given hormone replacement therapy (HRT) to treat symptoms of menopause. Follow these instructions at home: Alcohol use Do not drink alcohol if: Your health care provider tells you not to drink. You are pregnant, may be pregnant, or are planning to become pregnant. If you drink alcohol: Limit how much you have to: 0-1 drink a day. Know how much alcohol is in your drink. In the [place] one drink equals one 12 oz bottle of beer (355 mL), one 5 oz glass of wine (148 mL), or one 1 oz glass of hard liquor (44 mL). Lifestyle Do not use any products that contain nicotine or tobacco. These products include cigarettes, chewing tobacco, and vaping devices, such as e-cigarettes. If you need help quitting, ask your health care provider. Do not use street drugs. Do not share needles. Ask your health care provider for help if you need support or information about quitting drugs. General instructions Schedule regular health, dental, and eye exams. Stay current with your vaccines. Tell your health care provider  if: You often feel depressed. You have ever been abused or do not feel safe at home. Summary Adopting a healthy lifestyle and getting preventive care are important in promoting health and wellness. Follow your health care provider's instructions about healthy diet, exercising, and getting tested or screened for diseases. Follow your health care provider's instructions on monitoring your cholesterol and blood pressure. This information is not intended to replace advice given to you by your health care provider. Make sure you discuss any questions you have with your health care provider. Document Revised: 06/14/2020 Document Reviewed: 06/14/2020 Elsevier Patient Education  2023 Elsevier Inc.     Samantha Barth, MD [facility] at [facility]

## 2022-06-28 NOTE — Assessment & Plan Note (Signed)
Well-controlled hypertension. Will continue olmesartan 40 mg and Aldactone 50 mg daily recognizing the possibility of hyperkalemia.  CMP done today. HCTZ has been tried in the past without success according to patient Has been on these 2 medications for a while with success. Cardiovascular risks associated with hypertension discussed Dietary approaches to stop hypertension discussed

## 2022-06-28 NOTE — Assessment & Plan Note (Signed)
Advised to stay well-hydrated and avoid NSAIDs Will consider stopping allopurinol

## 2022-08-08 ENCOUNTER — Encounter (HOSPITAL_BASED_OUTPATIENT_CLINIC_OR_DEPARTMENT_OTHER): Payer: Self-pay | Admitting: Cardiology

## 2022-09-09 ENCOUNTER — Other Ambulatory Visit: Payer: Self-pay | Admitting: Emergency Medicine

## 2022-09-09 DIAGNOSIS — E785 Hyperlipidemia, unspecified: Secondary | ICD-10-CM

## 2022-09-19 ENCOUNTER — Ambulatory Visit (HOSPITAL_BASED_OUTPATIENT_CLINIC_OR_DEPARTMENT_OTHER): Payer: BC Managed Care – PPO | Admitting: Cardiology

## 2022-09-19 ENCOUNTER — Encounter (HOSPITAL_BASED_OUTPATIENT_CLINIC_OR_DEPARTMENT_OTHER): Payer: Self-pay | Admitting: Cardiology

## 2022-09-19 VITALS — BP 134/68 | HR 65 | Ht 59.0 in | Wt 228.3 lb

## 2022-09-19 DIAGNOSIS — R079 Chest pain, unspecified: Secondary | ICD-10-CM

## 2022-09-19 DIAGNOSIS — Z7189 Other specified counseling: Secondary | ICD-10-CM | POA: Diagnosis not present

## 2022-09-19 DIAGNOSIS — R002 Palpitations: Secondary | ICD-10-CM | POA: Diagnosis not present

## 2022-09-19 DIAGNOSIS — I10 Essential (primary) hypertension: Secondary | ICD-10-CM

## 2022-09-19 NOTE — Progress Notes (Signed)
Cardiology Office Note:  .   Date:  09/19/2022  ID:  Samantha Pearson, DOB [birth year], MRN [id number] PCP: Georgina Quint, MD  [facility] Providers Cardiologist:  Jodelle Red, MD {  History of Present Illness: .   Samantha Pearson is a 62 y.o. female with a hx of hypertension and obesity who is seen in follow up today.    Echo done 08/2017 showed mild diastolic dysfunction. Declined sleep study, not covered for in lab but could do home sleep study, but had out of pocket cost.    Medication history: Tried chlorthalidone, caused severe gout. Has had foot swelling on amlodipine in the past, doesn't tolerate.   Today: Lost her mom two months ago, still grieving. Affecting her sleeping and eating. Hasn't checked home BP since her mom passed. Has had mild chronic chest pain and palpitations, not specifically worse since her mom passed. Job is also very stressful right now. Discussed management strategies today.  ROS: Denies shortness of breath at rest or with normal exertion. No PND, orthopnea, LE edema or unexpected weight gain. No syncope. ROS otherwise negative except as noted.   Studies Reviewed: Marland Kitchen    EKG:     not ordered today  Physical Exam:   VS:  BP (!) 160/78 (BP Location: Left Arm, Patient Position: Sitting, Cuff Size: Large)   Pulse 65   Ht 4\' 11"  (1.499 m)   Wt 228 lb 4.8 oz (103.6 kg)   LMP 04/09/2014   SpO2 99%   BMI 46.11 kg/m    Wt Readings from Last 3 Encounters:  09/19/22 228 lb 4.8 oz (103.6 kg)  06/28/22 223 lb 4 oz (101.3 kg)  12/28/21 221 lb 2 oz (100.3 kg)    GEN: Well nourished, well developed in no acute distress HEENT: Normal, moist mucous membranes NECK: No JVD CARDIAC: regular rhythm, normal S1 and S2, no rubs or gallops. No murmur. VASCULAR: Radial and DP pulses 2+ bilaterally. No carotid bruits RESPIRATORY:  Clear to auscultation without rales, wheezing or rhonchi  ABDOMEN: Soft, non-tender, non-distended MUSCULOSKELETAL:   Ambulates independently SKIN: Warm and dry, no edema NEUROLOGIC:  Alert and oriented x 3. No focal neuro deficits noted. PSYCHIATRIC:  Normal affect    ASSESSMENT AND PLAN: .    Grief -did counseling today. Her sleep and appetite are affected, and she is under a lot of stress at work. Tearful during our visit. Discussed management of grief. Also noted that given how this is affecting sleep and appetite, it would be worth talking to Dr. Alvy Bimler to see if there are short term medications that could assist.  Palpitations Chest pain, intermittent -currently high stress with her job and recent loss of her mother -she will contact me if these worsen -reviewed both cardiac and noncardiac potential causes of symptoms -reviewed red flag warning signs that need immediate medical attention   Hypertension: goal of <130/80.  -near goal today on recheck -severe gout on chlorthalidone, swelling on amlodipine -continue spironolactone and olmesartan   Class 3 obesity  -we have discussed at length -with acute stress, now is not the time to focus on weight loss. Did discuss activity in terms of stress management. She is going to think about joining [employer].  CV risk counseling and prevention -recommend heart healthy/Mediterranean diet, with whole grains, fruits, vegetable, fish, lean meats, nuts, and olive oil. Limit salt. -recommend moderate walking, 3-5 times/week for 30-50 minutes each session. Aim for at least 150 minutes.week. Goal should  be pace of 3 miles/hours, or walking 1.5 miles in 30 minutes -recommend avoidance of tobacco products. Avoid excess alcohol. -ASCVD risk score: The 10-year ASCVD risk score (Arnett DK, et al., 2019) is: 9.4%   Values used to calculate the score:     Age: 42 years     Sex: Female     Is Non-Hispanic African American: Yes     Diabetic: No     Tobacco smoker: No     Systolic Blood Pressure: 160 mmHg     Is BP treated: Yes     HDL Cholesterol:  55.1 mg/dL     Total Cholesterol: 136 mg/dL   Dispo: 1 year  Signed, Jodelle Red, MD   Jodelle Red, MD, PhD, [facility] [place]  [facility]  [place]  [facility] & Vascular at [facility] at [facility] [address] [place], [place] [address] [phone]

## 2022-09-19 NOTE — Patient Instructions (Signed)
Medication Instructions:  Continue same medications *If you need a refill on your cardiac medications before your next appointment, please call your pharmacy*   Lab Work: None ordered   Testing/Procedures: None ordered   Follow-Up: At [facility], you and your health needs are our priority.  As part of our continuing mission to provide you with exceptional heart care, we have created designated Provider Care Teams.  These Care Teams include your primary Cardiologist (physician) and Advanced Practice Providers (APPs -  Physician Assistants and Nurse Practitioners) who all work together to provide you with the care you need, when you need it.  We recommend signing up for the patient portal called "MyChart".  Sign up information is provided on this After Visit Summary.  MyChart is used to connect with patients for Virtual Visits (Telemedicine).  Patients are able to view lab/test results, encounter notes, upcoming appointments, etc.  Non-urgent messages can be sent to your provider as well.   To learn more about what you can do with MyChart, go to [email or web address].    Your next appointment:  1 year    Provider: Dr.Christopher

## 2022-10-11 DIAGNOSIS — B349 Viral infection, unspecified: Secondary | ICD-10-CM | POA: Diagnosis not present

## 2022-10-11 DIAGNOSIS — R040 Epistaxis: Secondary | ICD-10-CM | POA: Diagnosis not present

## 2022-10-11 DIAGNOSIS — U071 COVID-19: Secondary | ICD-10-CM | POA: Diagnosis not present

## 2022-11-29 ENCOUNTER — Other Ambulatory Visit: Payer: Self-pay | Admitting: Emergency Medicine

## 2022-11-29 DIAGNOSIS — I10 Essential (primary) hypertension: Secondary | ICD-10-CM

## 2022-12-28 ENCOUNTER — Ambulatory Visit: Payer: BC Managed Care – PPO | Admitting: Emergency Medicine

## 2022-12-28 ENCOUNTER — Encounter: Payer: Self-pay | Admitting: Emergency Medicine

## 2022-12-28 VITALS — BP 138/70 | HR 72 | Temp 98.4°F | Ht 59.0 in | Wt 225.6 lb

## 2022-12-28 DIAGNOSIS — Z0000 Encounter for general adult medical examination without abnormal findings: Secondary | ICD-10-CM | POA: Diagnosis not present

## 2022-12-28 DIAGNOSIS — Z1329 Encounter for screening for other suspected endocrine disorder: Secondary | ICD-10-CM

## 2022-12-28 DIAGNOSIS — E785 Hyperlipidemia, unspecified: Secondary | ICD-10-CM

## 2022-12-28 DIAGNOSIS — M1A09X1 Idiopathic chronic gout, multiple sites, with tophus (tophi): Secondary | ICD-10-CM

## 2022-12-28 DIAGNOSIS — R7303 Prediabetes: Secondary | ICD-10-CM | POA: Diagnosis not present

## 2022-12-28 DIAGNOSIS — N1831 Chronic kidney disease, stage 3a: Secondary | ICD-10-CM | POA: Diagnosis not present

## 2022-12-28 DIAGNOSIS — I10 Essential (primary) hypertension: Secondary | ICD-10-CM

## 2022-12-28 DIAGNOSIS — Z0001 Encounter for general adult medical examination with abnormal findings: Secondary | ICD-10-CM

## 2022-12-28 DIAGNOSIS — Z130 Encounter for screening for diseases of the blood and blood-forming organs and certain disorders involving the immune mechanism: Secondary | ICD-10-CM

## 2022-12-28 DIAGNOSIS — Z13228 Encounter for screening for other metabolic disorders: Secondary | ICD-10-CM

## 2022-12-28 LAB — CBC WITH DIFFERENTIAL/PLATELET
Basophils Absolute: 0 10*3/uL (ref 0.0–0.1)
Basophils Relative: 0.6 % (ref 0.0–3.0)
Eosinophils Absolute: 0.3 10*3/uL (ref 0.0–0.7)
Eosinophils Relative: 4.2 % (ref 0.0–5.0)
HCT: 39.2 % (ref 36.0–46.0)
Hemoglobin: 12.6 g/dL (ref 12.0–15.0)
Lymphocytes Relative: 39.4 % (ref 12.0–46.0)
Lymphs Abs: 2.6 10*3/uL (ref 0.7–4.0)
MCHC: 32.1 g/dL (ref 30.0–36.0)
MCV: 86.4 fL (ref 78.0–100.0)
Monocytes Absolute: 0.4 10*3/uL (ref 0.1–1.0)
Monocytes Relative: 6.1 % (ref 3.0–12.0)
Neutro Abs: 3.3 10*3/uL (ref 1.4–7.7)
Neutrophils Relative %: 49.7 % (ref 43.0–77.0)
Platelets: 284 10*3/uL (ref 150.0–400.0)
RBC: 4.54 Mil/uL (ref 3.87–5.11)
RDW: 14.3 % (ref 11.5–15.5)
WBC: 6.6 10*3/uL (ref 4.0–10.5)

## 2022-12-28 LAB — COMPREHENSIVE METABOLIC PANEL
ALT: 28 U/L (ref 0–35)
AST: 30 U/L (ref 0–37)
Albumin: 4.7 g/dL (ref 3.5–5.2)
Alkaline Phosphatase: 95 U/L (ref 39–117)
BUN: 17 mg/dL (ref 6–23)
CO2: 28 meq/L (ref 19–32)
Calcium: 10.3 mg/dL (ref 8.4–10.5)
Chloride: 101 meq/L (ref 96–112)
Creatinine, Ser: 0.9 mg/dL (ref 0.40–1.20)
GFR: 68.96 mL/min (ref >60.00)
Glucose, Bld: 91 mg/dL (ref 70–99)
Potassium: 4.1 meq/L (ref 3.5–5.1)
Sodium: 138 meq/L (ref 135–145)
Total Bilirubin: 1.2 mg/dL (ref 0.2–1.2)
Total Protein: 8.1 g/dL (ref 6.0–8.3)

## 2022-12-28 LAB — LIPID PANEL
Cholesterol: 160 mg/dL (ref 0–200)
HDL: 54.3 mg/dL (ref >39.00)
LDL Cholesterol: 84 mg/dL (ref 0–99)
NonHDL: 105.5
Total CHOL/HDL Ratio: 3
Triglycerides: 106 mg/dL (ref 0.0–149.0)
VLDL: 21.2 mg/dL (ref 0.0–40.0)

## 2022-12-28 LAB — URIC ACID: Uric Acid, Serum: 5.8 mg/dL (ref 2.4–7.0)

## 2022-12-28 LAB — HEMOGLOBIN A1C: Hgb A1c MFr Bld: 6 % (ref 4.6–6.5)

## 2022-12-28 LAB — TSH: TSH: 1.13 u[IU]/mL (ref 0.35–5.50)

## 2022-12-28 NOTE — Progress Notes (Signed)
[name] 62 y.o.   Chief Complaint  Patient presents with   Annual Exam    6 month HTN f/u. Has physical forms to be filled out. No other concerns     HISTORY OF PRESENT ILLNESS: This is a 62 y.o. female A1A here for annual exam and follow-up on multiple chronic medical conditions including hypertension. Overall doing well.  Has no complaints or medical concerns Was able to follow-up with cardiologist last May Assessment and plan as follows: ASSESSMENT AND PLAN: .     Grief -did counseling today. Her sleep and appetite are affected, and she is under a lot of stress at work. Tearful during our visit. Discussed management of grief. Also noted that given how this is affecting sleep and appetite, it would be worth talking to Dr. Alvy Bimler to see if there are short term medications that could assist.   Palpitations Chest pain, intermittent -currently high stress with her job and recent loss of her mother -she will contact me if these worsen -reviewed both cardiac and noncardiac potential causes of symptoms -reviewed red flag warning signs that need immediate medical attention   Hypertension: goal of <130/80.  -near goal today on recheck -severe gout on chlorthalidone, swelling on amlodipine -continue spironolactone and olmesartan   Class 3 obesity  -we have discussed at length -with acute stress, now is not the time to focus on weight loss. Did discuss activity in terms of stress management. She is going to think about joining [employer].   CV risk counseling and prevention -recommend heart healthy/Mediterranean diet, with whole grains, fruits, vegetable, fish, lean meats, nuts, and olive oil. Limit salt. -recommend moderate walking, 3-5 times/week for 30-50 minutes each session. Aim for at least 150 minutes.week. Goal should be pace of 3 miles/hours, or walking 1.5 miles in 30 minutes -recommend avoidance of tobacco products. Avoid excess alcohol. BP Readings from  Last 3 Encounters:  09/19/22 134/68  06/28/22 134/72  06/16/22 124/62   Wt Readings from Last 3 Encounters:  09/19/22 228 lb 4.8 oz (103.6 kg)  06/28/22 223 lb 4 oz (101.3 kg)  12/28/21 221 lb 2 oz (100.3 kg)     HPI   Prior to Admission medications   Medication Sig Start Date End Date Taking? Authorizing Provider  allopurinol (ZYLOPRIM) 100 MG tablet Take 100 mg by mouth daily.   Yes [provider]  diclofenac Sodium (VOLTAREN) 1 % GEL Apply 2 g topically 4 (four) times daily. 02/03/20  Yes Just, Azalee Course, FNP  ipratropium (ATROVENT) 0.03 % nasal spray USE 2 SPRAYS INTO BOTH NOSTRILS EVERY 12 HOURS 07/24/21  Yes Mahonri Seiden, Eilleen Kempf, MD  olmesartan (BENICAR) 40 MG tablet TAKE 1 TABLET BY MOUTH EVERY DAY 11/29/22  Yes Maayan Jenning, Eilleen Kempf, MD  rosuvastatin (CRESTOR) 10 MG tablet TAKE 1 TABLET BY MOUTH EVERY DAY 09/09/22  Yes Lismary Kiehn, Eilleen Kempf, MD  spironolactone (ALDACTONE) 50 MG tablet Take 1 tablet (50 mg total) by mouth daily. 06/28/22 06/23/23 Yes Jacqulynn Shappell, Eilleen Kempf, MD  Turmeric POWD 1,950 mg by Does not apply route 3 (three) times daily.   Yes [provider]    Allergies  Allergen Reactions   Chlorthalidone Other (See Comments)    Severe gout   Doxycycline Hives, Itching and Swelling   Norvasc [Amlodipine Besylate] Swelling    Feet swelling   Septra [Bactrim] Hives, Itching and Swelling   Latex Swelling and Rash    Patient Active Problem List   Diagnosis Date Noted  History of gout 07/27/2021   Prediabetes 07/27/2021   Seasonal allergies 06/15/2021   Dyslipidemia 10/20/2020   Stage 3a chronic kidney disease (HCC) 10/20/2020   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   Morbid obesity (HCC) 08/18/2013   Essential hypertension 08/07/2008    Past Medical History:  Diagnosis Date   Allergy    Anxiety    Arthritis    Chest pain 08/03/08   Urgent care visit   Depression    Dyspnea    Edema    Heart murmur     Hypertension    Obesity     Past Surgical History:  Procedure Laterality Date   CESAREAN SECTION  04/16/1980   DILATION AND CURETTAGE OF UTERUS     laparoscopic    Social History   Socioeconomic History   Marital status: Single    Spouse name: Not on file   Number of children: 1   Years of education: Not on file   Highest education level: 12th grade  Occupational History   Occupation: 3 jobs    Comment: International aid/development worker  Tobacco Use   Smoking status: Never   Smokeless tobacco: Never   Tobacco comments:    Nonsmoker  Substance and Sexual Activity   Alcohol use: No    Alcohol/week: 0.0 standard drinks of alcohol   Drug use: No   Sexual activity: Yes    Birth control/protection: None  Other Topics Concern   Not on file  Social History Narrative   Lives with mother   Marital status: no    Children: 1 daughter   Metallurgist - 2 (granddaughter and grandson)   Lives with: alone   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   Tobacco:  none   Alcohol:  none   Drugs:  none   Exercise:  Active job   Seatbelt: 100%   Guns in home: none      Filed for bankruptcy in 2010      Social Determinants of Health   Financial Resource Strain: Not on file  Food Insecurity: Unknown (06/27/2022)   Hunger Vital Sign    Worried About Running Out of Food in the Last Year: Never true    Ran Out of Food in the Last Year: Patient declined  Transportation Needs: No Transportation Needs (06/27/2022)   PRAPARE - Administrator, Civil Service (Medical): No    Lack of Transportation (Non-Medical): No  Physical Activity: Sufficiently Active (06/27/2022)   Exercise Vital Sign    Days of Exercise per Week: 6 days    Minutes of Exercise per Session: 150+ min  Stress: No Stress Concern Present (06/27/2022)   [employer] of Occupational Health - Occupational Stress Questionnaire    Feeling of Stress : Only a little  Social  Connections: Socially Isolated (06/27/2022)   Social Connection and Isolation Panel [NHANES]    Frequency of Communication with Friends and Family: Three times a week    Frequency of Social Gatherings with Friends and Family: More than three times a week    Attends Religious Services: Never    Database administrator or Organizations: No    Attends Engineer, structural: Not on file    Marital Status: Never married  Intimate Partner Violence: Not on file    Family History  Problem Relation Age of Onset   Prostate cancer Father    Congestive Heart Failure Father  Hypertension Father    Colon cancer Maternal Uncle    Colon polyps Mother    Breast cancer Mother 5       Left   Hyperlipidemia Mother    Hypertension Mother    Hypertension Sister    Hypertension Brother    Hypertension Sister    Hypertension Sister    Hypertension Sister    Hyperlipidemia Maternal Grandmother    Hypertension Maternal Grandmother    Esophageal cancer Neg Hx    Stomach cancer Neg Hx    Rectal cancer Neg Hx      Review of Systems  Constitutional: Negative.  Negative for chills and fever.  HENT: Negative.  Negative for congestion and sore throat.   Respiratory: Negative.  Negative for cough and shortness of breath.   Cardiovascular: Negative.  Negative for chest pain and palpitations.  Gastrointestinal:  Negative for abdominal pain, diarrhea, nausea and vomiting.  Genitourinary: Negative.  Negative for dysuria and hematuria.  Skin: Negative.  Negative for rash.  Neurological: Negative.  Negative for dizziness and headaches.    Today's Vitals   12/28/22 1315  BP: 138/70  Pulse: 72  Temp: 98.4 F (36.9 C)  TempSrc: Oral  SpO2: 98%  Weight: 225 lb 9.6 oz (102.3 kg)  Height: 4\' 11"  (1.499 m)   Body mass index is 45.57 kg/m.   Physical Exam Vitals reviewed.  Constitutional:      Appearance: Normal appearance.  HENT:     Head: Normocephalic.     Right Ear: Tympanic membrane,  ear canal and external ear normal.     Left Ear: Tympanic membrane, ear canal and external ear normal.     Mouth/Throat:     Mouth: Mucous membranes are moist.     Pharynx: Oropharynx is clear.  Eyes:     Extraocular Movements: Extraocular movements intact.     Conjunctiva/sclera: Conjunctivae normal.     Pupils: Pupils are equal, round, and reactive to light.  Cardiovascular:     Rate and Rhythm: Normal rate and regular rhythm.     Pulses: Normal pulses.     Heart sounds: Normal heart sounds.  Pulmonary:     Effort: Pulmonary effort is normal.     Breath sounds: Normal breath sounds.  Abdominal:     Palpations: Abdomen is soft.     Tenderness: There is no abdominal tenderness.  Musculoskeletal:     Cervical back: No tenderness.  Lymphadenopathy:     Cervical: No cervical adenopathy.  Skin:    General: Skin is warm and dry.     Capillary Refill: Capillary refill takes less than 2 seconds.  Neurological:     General: No focal deficit present.     Mental Status: She is alert and oriented to person, place, and time.  Psychiatric:        Mood and Affect: Mood normal.        Behavior: Behavior normal.      ASSESSMENT & PLAN: Problem List Items Addressed This Visit       Cardiovascular and Mediastinum   Essential hypertension    BP Readings from Last 3 Encounters:  12/28/22 138/70  09/19/22 134/68  06/28/22 134/72  Continue olmesartan 40 mg daily and spironolactone 50 mg daily Cardiovascular risks associated with hypertension discussed       Relevant Orders   Comprehensive metabolic panel   Lipid panel     Musculoskeletal and Integument   Idiopathic chronic gout of multiple sites with tophus  Clinically stable.  No recent gout flareups Continues daily allopurinol 100 mg Uric acid done today      Relevant Orders   Uric acid     Genitourinary   Stage 3a chronic kidney disease (HCC)    Advised to stay well-hydrated and avoid NSAIDs Will consider stopping  allopurinol      Relevant Orders   Comprehensive metabolic panel     Other   Morbid obesity (HCC)    Diet and nutrition discussed Advised to decrease amount of daily carbohydrate intake and daily calories and increase amount of plant-based protein in her diet Benefits of exercise discussed      Relevant Orders   Comprehensive metabolic panel   Lipid panel   TSH   Dyslipidemia    Diet and nutrition discussed Chronic stable condition Lipid profile done today Continue rosuvastatin 10 mg daily The 10-year ASCVD risk score (Arnett DK, et al., 2019) is: 6.3%   Values used to calculate the score:     Age: 87 years     Sex: Female     Is Non-Hispanic African American: Yes     Diabetic: No     Tobacco smoker: No     Systolic Blood Pressure: 138 mmHg     Is BP treated: Yes     HDL Cholesterol: 55.1 mg/dL     Total Cholesterol: 136 mg/dL       Relevant Orders   Comprehensive metabolic panel   Lipid panel   Prediabetes    Diet and nutrition discussed Advised to decrease amount of daily carbohydrate intake and daily calories and increase amount of plant-based protein in her diet Benefits of exercise discussed      Relevant Orders   Comprehensive metabolic panel   Hemoglobin A1c   Other Visit Diagnoses     Encounter for general adult medical examination with abnormal findings    -  Primary   Relevant Orders   CBC with Differential   Comprehensive metabolic panel   Hemoglobin A1c   Lipid panel   TSH   Screening for deficiency anemia       Relevant Orders   CBC with Differential   Screening for endocrine, metabolic and immunity disorder       Relevant Orders   Comprehensive metabolic panel      Modifiable risk factors discussed with patient. Anticipatory guidance according to age provided. The following topics were also discussed: Social Determinants of Health Smoking.  Non-smoker Diet and nutrition and need to decrease amount of daily carbohydrate intake and  daily calories and increase amount of plant-based protein in her diet Benefits of exercise Cancer screening and review of most recent mammogram and colonoscopy reports Vaccinations review and recommendations Cardiovascular risk assessment The 10-year ASCVD risk score (Arnett DK, et al., 2019) is: 6.3%   Values used to calculate the score:     Age: 88 years     Sex: Female     Is Non-Hispanic African American: Yes     Diabetic: No     Tobacco smoker: No     Systolic Blood Pressure: 138 mmHg     Is BP treated: Yes     HDL Cholesterol: 55.1 mg/dL     Total Cholesterol: 136 mg/dL Review of multiple chronic medical conditions Review of all medications Mental health including depression and anxiety Fall and accident prevention  Patient Instructions  Health Maintenance, Female Adopting a healthy lifestyle and getting preventive care are important in promoting health and wellness.  Ask your health care provider about: The right schedule for you to have regular tests and exams. Things you can do on your own to prevent diseases and keep yourself healthy. What should I know about diet, weight, and exercise? Eat a healthy diet  Eat a diet that includes plenty of vegetables, fruits, low-fat dairy products, and lean protein. Do not eat a lot of foods that are high in solid fats, added sugars, or sodium. Maintain a healthy weight Body mass index (BMI) is used to identify weight problems. It estimates body fat based on height and weight. Your health care provider can help determine your BMI and help you achieve or maintain a healthy weight. Get regular exercise Get regular exercise. This is one of the most important things you can do for your health. Most adults should: Exercise for at least 150 minutes each week. The exercise should increase your heart rate and make you sweat (moderate-intensity exercise). Do strengthening exercises at least twice a week. This is in addition to the  moderate-intensity exercise. Spend less time sitting. Even light physical activity can be beneficial. Watch cholesterol and blood lipids Have your blood tested for lipids and cholesterol at 61 years of age, then have this test every 5 years. Have your cholesterol levels checked more often if: Your lipid or cholesterol levels are high. You are older than 61 years of age. You are at high risk for heart disease. What should I know about cancer screening? Depending on your health history and family history, you may need to have cancer screening at various ages. This may include screening for: Breast cancer. Cervical cancer. Colorectal cancer. Skin cancer. Lung cancer. What should I know about heart disease, diabetes, and high blood pressure? Blood pressure and heart disease High blood pressure causes heart disease and increases the risk of stroke. This is more likely to develop in people who have high blood pressure readings or are overweight. Have your blood pressure checked: Every 3-5 years if you are 9-51 years of age. Every year if you are 46 years old or older. Diabetes Have regular diabetes screenings. This checks your fasting blood sugar level. Have the screening done: Once every three years after age 22 if you are at a normal weight and have a low risk for diabetes. More often and at a younger age if you are overweight or have a high risk for diabetes. What should I know about preventing infection? Hepatitis B If you have a higher risk for hepatitis B, you should be screened for this virus. Talk with your health care provider to find out if you are at risk for hepatitis B infection. Hepatitis C Testing is recommended for: Everyone born from [birth year] through 1964. Anyone with known risk factors for hepatitis C. Sexually transmitted infections (STIs) Get screened for STIs, including gonorrhea and chlamydia, if: You are sexually active and are younger than 61 years of age. You are  older than 61 years of age and your health care provider tells you that you are at risk for this type of infection. Your sexual activity has changed since you were last screened, and you are at increased risk for chlamydia or gonorrhea. Ask your health care provider if you are at risk. Ask your health care provider about whether you are at high risk for HIV. Your health care provider may recommend a prescription medicine to help prevent HIV infection. If you choose to take medicine to prevent HIV, you should first get tested for HIV.  You should then be tested every 3 months for as long as you are taking the medicine. Pregnancy If you are about to stop having your period (premenopausal) and you may become pregnant, seek counseling before you get pregnant. Take 400 to 800 micrograms (mcg) of folic acid every day if you become pregnant. Ask for birth control (contraception) if you want to prevent pregnancy. Osteoporosis and menopause Osteoporosis is a disease in which the bones lose minerals and strength with aging. This can result in bone fractures. If you are 38 years old or older, or if you are at risk for osteoporosis and fractures, ask your health care provider if you should: Be screened for bone loss. Take a calcium or vitamin D supplement to lower your risk of fractures. Be given hormone replacement therapy (HRT) to treat symptoms of menopause. Follow these instructions at home: Alcohol use Do not drink alcohol if: Your health care provider tells you not to drink. You are pregnant, may be pregnant, or are planning to become pregnant. If you drink alcohol: Limit how much you have to: 0-1 drink a day. Know how much alcohol is in your drink. In the [place] one drink equals one 12 oz bottle of beer (355 mL), one 5 oz glass of wine (148 mL), or one 1 oz glass of hard liquor (44 mL). Lifestyle Do not use any products that contain nicotine or tobacco. These products include cigarettes, chewing  tobacco, and vaping devices, such as e-cigarettes. If you need help quitting, ask your health care provider. Do not use street drugs. Do not share needles. Ask your health care provider for help if you need support or information about quitting drugs. General instructions Schedule regular health, dental, and eye exams. Stay current with your vaccines. Tell your health care provider if: You often feel depressed. You have ever been abused or do not feel safe at home. Summary Adopting a healthy lifestyle and getting preventive care are important in promoting health and wellness. Follow your health care provider's instructions about healthy diet, exercising, and getting tested or screened for diseases. Follow your health care provider's instructions on monitoring your cholesterol and blood pressure. This information is not intended to replace advice given to you by your health care provider. Make sure you discuss any questions you have with your health care provider. Document Revised: 06/14/2020 Document Reviewed: 06/14/2020 Elsevier Patient Education  2024 Elsevier Inc.       Edwina Barth, MD [facility]

## 2022-12-28 NOTE — Assessment & Plan Note (Signed)
Diet and nutrition discussed. Advised to decrease amount of daily carbohydrate intake and daily calories and increase amount of plant based protein in her diet Benefits of exercise discussed.

## 2022-12-28 NOTE — Patient Instructions (Signed)

## 2022-12-28 NOTE — Assessment & Plan Note (Signed)
BP Readings from Last 3 Encounters:  12/28/22 138/70  09/19/22 134/68  06/28/22 134/72  Continue olmesartan 40 mg daily and spironolactone 50 mg daily Cardiovascular risks associated with hypertension discussed

## 2022-12-28 NOTE — Addendum Note (Signed)
Addended by: Trellis Paganini D on: 12/28/2022 01:51 PM   Modules accepted: Orders

## 2022-12-28 NOTE — Assessment & Plan Note (Signed)
Clinically stable.  No recent gout flareups Continues daily allopurinol 100 mg Uric acid done today

## 2022-12-28 NOTE — Assessment & Plan Note (Signed)
Diet and nutrition discussed Chronic stable condition Lipid profile done today Continue rosuvastatin 10 mg daily The 10-year ASCVD risk score (Arnett DK, et al., 2019) is: 6.3%   Values used to calculate the score:     Age: 62 years     Sex: Female     Is Non-Hispanic African American: Yes     Diabetic: No     Tobacco smoker: No     Systolic Blood Pressure: 138 mmHg     Is BP treated: Yes     HDL Cholesterol: 55.1 mg/dL     Total Cholesterol: 136 mg/dL

## 2022-12-28 NOTE — Assessment & Plan Note (Signed)
Advised to stay well-hydrated and avoid NSAIDs Will consider stopping allopurinol

## 2023-02-14 DIAGNOSIS — M109 Gout, unspecified: Secondary | ICD-10-CM | POA: Diagnosis not present

## 2023-02-14 DIAGNOSIS — I10 Essential (primary) hypertension: Secondary | ICD-10-CM | POA: Diagnosis not present

## 2023-02-14 DIAGNOSIS — N289 Disorder of kidney and ureter, unspecified: Secondary | ICD-10-CM | POA: Diagnosis not present

## 2023-02-14 DIAGNOSIS — M1990 Unspecified osteoarthritis, unspecified site: Secondary | ICD-10-CM | POA: Diagnosis not present

## 2023-02-22 DIAGNOSIS — Z1231 Encounter for screening mammogram for malignant neoplasm of breast: Secondary | ICD-10-CM | POA: Diagnosis not present

## 2023-02-22 DIAGNOSIS — Z01419 Encounter for gynecological examination (general) (routine) without abnormal findings: Secondary | ICD-10-CM | POA: Diagnosis not present

## 2023-04-23 ENCOUNTER — Ambulatory Visit: Admitting: Family Medicine

## 2023-04-23 ENCOUNTER — Ambulatory Visit: Payer: Self-pay | Admitting: Emergency Medicine

## 2023-04-23 ENCOUNTER — Encounter: Payer: Self-pay | Admitting: Family Medicine

## 2023-04-23 VITALS — BP 130/60 | HR 78 | Temp 98.3°F | Resp 18 | Ht 59.0 in | Wt 229.0 lb

## 2023-04-23 DIAGNOSIS — J0100 Acute maxillary sinusitis, unspecified: Secondary | ICD-10-CM

## 2023-04-23 MED ORDER — CEPHALEXIN 500 MG PO CAPS: 500.0000 mg | ORAL_CAPSULE | Freq: Two times a day (BID) | ORAL | 0 refills | Status: AC

## 2023-04-23 NOTE — Patient Instructions (Signed)
 Continue over-the-counter symptom relief. Other things to try include Vicks and a humidifier at night.

## 2023-04-23 NOTE — Progress Notes (Signed)
 Assessment & Plan:  1. Acute non-recurrent maxillary sinusitis (Primary) Education provided on sinus infections. Continue over-the-counter symptom relief. Other things to try include Vicks and a humidifier at night.  - cephALEXin (KEFLEX) 500 MG capsule; Take 1 capsule (500 mg total) by mouth 2 (two) times daily for 7 days.  Dispense: 14 capsule; Refill: 0    Follow up plan: Return if symptoms worsen or fail to improve.  Deliah Boston, MSN, APRN, FNP-C  Subjective:  HPI: Samantha Pearson is a 63 y.o. female presenting on 04/23/2023 for Sinusitis (Started with watery drainage on Thursday last week. Now has chest and sinus congestion with color - green/bloody, left side facial pressure. No fever. Slight cough. )  Patient complains of cough, head/chest congestion, headache, facial pain/pressure, and wheezing. She denies sore throat, fever, and shortness of breath. Onset of symptoms was 4 days ago, slightly better since that time. She is drinking plenty of fluids. Evaluation to date: none. Treatment to date:  cough & congestion, cough & cold HPB, nasal saline spray .  She does not smoke. Last antibiotic use was Amoxicillin in December 2024.    ROS: Negative unless specifically indicated above in HPI.   Relevant past medical history reviewed and updated as indicated.   Allergies and medications reviewed and updated.   Current Outpatient Medications:    allopurinol (ZYLOPRIM) 100 MG tablet, Take 100 mg by mouth daily., Disp: , Rfl:    diclofenac Sodium (VOLTAREN) 1 % GEL, Apply 2 g topically 4 (four) times daily., Disp: 50 g, Rfl: 3   olmesartan (BENICAR) 40 MG tablet, TAKE 1 TABLET BY MOUTH EVERY DAY, Disp: 90 tablet, Rfl: 3   rosuvastatin (CRESTOR) 10 MG tablet, TAKE 1 TABLET BY MOUTH EVERY DAY, Disp: 90 tablet, Rfl: 3   spironolactone (ALDACTONE) 50 MG tablet, Take 1 tablet (50 mg total) by mouth daily., Disp: 90 tablet, Rfl: 3   Turmeric POWD, 1,950 mg by Does not apply route 3 (three)  times daily., Disp: , Rfl:    ipratropium (ATROVENT) 0.03 % nasal spray, USE 2 SPRAYS INTO BOTH NOSTRILS EVERY 12 HOURS (Patient not taking: Reported on 04/23/2023), Disp: 30 mL, Rfl: 1  Allergies  Allergen Reactions   Chlorthalidone Other (See Comments)    Severe gout   Doxycycline Hives, Itching and Swelling   Norvasc [Amlodipine Besylate] Swelling    Feet swelling   Septra [Bactrim] Hives, Itching and Swelling   Latex Swelling and Rash    Objective:   BP 130/60   Pulse 78   Temp 98.3 F (36.8 C)   Resp 18   Ht 4\' 11"  (1.499 m)   Wt 229 lb (103.9 kg)   LMP 04/09/2014   SpO2 98%   BMI 46.25 kg/m    Physical Exam Vitals reviewed.  Constitutional:      General: She is not in acute distress.    Appearance: Normal appearance. She is not ill-appearing, toxic-appearing or diaphoretic.  HENT:     Head: Normocephalic and atraumatic.     Right Ear: Ear canal and external ear normal. There is no impacted cerumen. Tympanic membrane is bulging.     Left Ear: Tympanic membrane, ear canal and external ear normal. There is no impacted cerumen. Tympanic membrane is not bulging.     Nose: Congestion present. No rhinorrhea.     Right Sinus: Maxillary sinus tenderness present. No frontal sinus tenderness.     Left Sinus: Maxillary sinus tenderness present. No frontal sinus tenderness.  Mouth/Throat:     Mouth: Mucous membranes are moist.     Pharynx: Oropharynx is clear. No oropharyngeal exudate or posterior oropharyngeal erythema.  Eyes:     General: No scleral icterus.       Right eye: No discharge.        Left eye: No discharge.     Conjunctiva/sclera: Conjunctivae normal.  Cardiovascular:     Rate and Rhythm: Normal rate and regular rhythm.     Heart sounds: Normal heart sounds. No murmur heard.    No friction rub. No gallop.  Pulmonary:     Effort: Pulmonary effort is normal. No respiratory distress.     Breath sounds: Normal breath sounds. No stridor. No wheezing, rhonchi  or rales.  Musculoskeletal:        General: Normal range of motion.     Cervical back: Normal range of motion.  Lymphadenopathy:     Cervical: No cervical adenopathy.  Skin:    General: Skin is warm and dry.     Capillary Refill: Capillary refill takes less than 2 seconds.  Neurological:     General: No focal deficit present.     Mental Status: She is alert and oriented to person, place, and time. Mental status is at baseline.  Psychiatric:        Mood and Affect: Mood normal.        Behavior: Behavior normal.        Thought Content: Thought content normal.        Judgment: Judgment normal.

## 2023-04-23 NOTE — Telephone Encounter (Signed)
 Copied from CRM [id number]. Topic: Clinical - Red Word Triage >> Apr 23, 2023  9:02 AM Samantha Pearson wrote: Red Word that prompted transfer to Nurse Triage:   Possible sinus head cold Mucus is light green Symptoms started last Thurs Runny nose Sneezing Cough is productive Then began having post nasal drip and congestion No fever or body aches History of allergies Is taking antihistamines.  Chief Complaint: Sinus pain Symptoms: Congestion, cough, ear pressure, difficulty sleeping Frequency: Since Friday Pertinent Negatives: Patient denies fever Disposition: [] ED /[] Urgent Care (no appt availability in office) / [x] Appointment(In office/virtual)/ []  [facility]/ [] Home Care/ [] Refused Recommended Disposition /[] [facility]/ []  Follow-up with PCP Additional Notes: Patient called in to report sinus pain and congestion accompanied by additional symptoms. Patient is also experiencing chest congestion, cough, difficulty sleeping and left ear pressure. Patient stated symptoms have been occurring since Friday of last week. Patient denied fever. Patient able to speak in clear and complete sentences while on the phone with this RN. This RN advised patient to see a provider today, per protocol. No availability with PCP. Scheduled patient in office today with an alternate provider. This RN advised patient to call back if symptoms worsen or change. Patient complied.   Reason for Disposition  Earache  Answer Assessment - Initial Assessment Questions 1. LOCATION: "Where does it hurt?"      Nose/nasal passage 2. ONSET: "When did the sinus pain start?"  (e.g., hours, days)      Friday 3. SEVERITY: "How bad is the pain?"   (Scale 1-10; mild, moderate or severe)   - MILD (1-3): doesn't interfere with normal activities    - MODERATE (4-7): interferes with normal activities (e.g., work or school) or awakens from sleep   - SEVERE (8-10): excruciating pain and patient unable to do any normal  activities        Rates pain an 8 in her nose 4. RECURRENT SYMPTOM: "Have you ever had sinus problems before?" If Yes, ask: "When was the last time?" and "What happened that time?"      Yes, around December of 2024 5. NASAL CONGESTION: "Is the nose blocked?" If Yes, ask: "Can you open it or must you breathe through your mouth?"     States left side of nose is completely blocked 6. NASAL DISCHARGE: "Do you have discharge from your nose?" If so ask, "What color?"     Thick and light green, started seeing some blood when blowing nose 7. FEVER: "Do you have a fever?" If Yes, ask: "What is it, how was it measured, and when did it start?"      Denies  8. OTHER SYMPTOMS: "Do you have any other symptoms?" (e.g., sore throat, cough, earache, difficulty breathing)     Chest congestion, states breathing has been improving once she started taking a decongestant, dry cough, pressure in left ear, difficulty sleeping  Protocols used: Sinus Pain or Congestion-A-AH

## 2023-06-14 DIAGNOSIS — I10 Essential (primary) hypertension: Secondary | ICD-10-CM | POA: Diagnosis not present

## 2023-06-14 DIAGNOSIS — M109 Gout, unspecified: Secondary | ICD-10-CM | POA: Diagnosis not present

## 2023-06-14 DIAGNOSIS — M1990 Unspecified osteoarthritis, unspecified site: Secondary | ICD-10-CM | POA: Diagnosis not present

## 2023-06-27 ENCOUNTER — Encounter: Payer: Self-pay | Admitting: Emergency Medicine

## 2023-06-27 ENCOUNTER — Ambulatory Visit: Payer: BC Managed Care – PPO | Admitting: Emergency Medicine

## 2023-06-27 ENCOUNTER — Ambulatory Visit: Payer: Self-pay | Admitting: Emergency Medicine

## 2023-06-27 VITALS — BP 136/79 | HR 76 | Temp 99.1°F | Ht 59.0 in | Wt 222.0 lb

## 2023-06-27 DIAGNOSIS — I10 Essential (primary) hypertension: Secondary | ICD-10-CM | POA: Diagnosis not present

## 2023-06-27 DIAGNOSIS — R7303 Prediabetes: Secondary | ICD-10-CM | POA: Diagnosis not present

## 2023-06-27 DIAGNOSIS — M1A09X1 Idiopathic chronic gout, multiple sites, with tophus (tophi): Secondary | ICD-10-CM | POA: Diagnosis not present

## 2023-06-27 DIAGNOSIS — N1831 Chronic kidney disease, stage 3a: Secondary | ICD-10-CM | POA: Diagnosis not present

## 2023-06-27 DIAGNOSIS — E785 Hyperlipidemia, unspecified: Secondary | ICD-10-CM | POA: Diagnosis not present

## 2023-06-27 LAB — CBC WITH DIFFERENTIAL/PLATELET
Basophils Absolute: 0 10*3/uL (ref 0.0–0.1)
Basophils Relative: 0.7 % (ref 0.0–3.0)
Eosinophils Absolute: 0.2 10*3/uL (ref 0.0–0.7)
Eosinophils Relative: 4 % (ref 0.0–5.0)
HCT: 40 % (ref 36.0–46.0)
Hemoglobin: 12.9 g/dL (ref 12.0–15.0)
Lymphocytes Relative: 38.6 % (ref 12.0–46.0)
Lymphs Abs: 2 10*3/uL (ref 0.7–4.0)
MCHC: 32.3 g/dL (ref 30.0–36.0)
MCV: 83.6 fl (ref 78.0–100.0)
Monocytes Absolute: 0.4 10*3/uL (ref 0.1–1.0)
Monocytes Relative: 7.3 % (ref 3.0–12.0)
Neutro Abs: 2.5 10*3/uL (ref 1.4–7.7)
Neutrophils Relative %: 49.4 % (ref 43.0–77.0)
Platelets: 267 10*3/uL (ref 150.0–400.0)
RBC: 4.79 Mil/uL (ref 3.87–5.11)
RDW: 13.9 % (ref 11.5–15.5)
WBC: 5.1 10*3/uL (ref 4.0–10.5)

## 2023-06-27 LAB — COMPREHENSIVE METABOLIC PANEL WITH GFR
ALT: 18 U/L (ref 0–35)
AST: 19 U/L (ref 0–37)
Albumin: 4.7 g/dL (ref 3.5–5.2)
Alkaline Phosphatase: 91 U/L (ref 39–117)
BUN: 14 mg/dL (ref 6–23)
CO2: 28 meq/L (ref 19–32)
Calcium: 10.1 mg/dL (ref 8.4–10.5)
Chloride: 101 meq/L (ref 96–112)
Creatinine, Ser: 0.83 mg/dL (ref 0.40–1.20)
GFR: 75.73 mL/min (ref >60.00)
Glucose, Bld: 103 mg/dL — ABNORMAL HIGH (ref 70–99)
Potassium: 4.1 meq/L (ref 3.5–5.1)
Sodium: 138 meq/L (ref 135–145)
Total Bilirubin: 1.3 mg/dL — ABNORMAL HIGH (ref 0.2–1.2)
Total Protein: 8.1 g/dL (ref 6.0–8.3)

## 2023-06-27 LAB — LIPID PANEL
Cholesterol: 157 mg/dL (ref 0–200)
HDL: 51.2 mg/dL (ref >39.00)
LDL Cholesterol: 77 mg/dL (ref 0–99)
NonHDL: 105.83
Total CHOL/HDL Ratio: 3
Triglycerides: 146 mg/dL (ref 0.0–149.0)
VLDL: 29.2 mg/dL (ref 0.0–40.0)

## 2023-06-27 LAB — HEMOGLOBIN A1C: Hgb A1c MFr Bld: 5.7 % (ref 4.6–6.5)

## 2023-06-27 LAB — URIC ACID: Uric Acid, Serum: 6.2 mg/dL (ref 2.4–7.0)

## 2023-06-27 NOTE — Assessment & Plan Note (Signed)
 Chronic stable condition. Advised to stay well-hydrated and avoid NSAIDs

## 2023-06-27 NOTE — Assessment & Plan Note (Signed)
 Clinically stable.  No recent gout flareups Continues daily allopurinol 100 mg Uric acid done today

## 2023-06-27 NOTE — Assessment & Plan Note (Signed)
 Cardiovascular risks associated with diabetes discussed Diet and nutrition discussed Advised to decrease amount of daily carbohydrate intake and daily calories and increase amount of plant-based protein in her diet Benefits of exercise discussed

## 2023-06-27 NOTE — Patient Instructions (Signed)

## 2023-06-27 NOTE — Progress Notes (Signed)
 Samantha Pearson 63 y.o.   Chief Complaint  Patient presents with   Follow-up    6 month f/u for HTN. States she is taking both her bp meds, mentions she had a stressful day yesterday and did not sleep well last night. She also mentions having a slip/fall a month ago while trying to take a step up and her right shin to the bottom of knee cap hit/scrapped the step, still having some pain and has a knot on her right shin. No other concerns     HISTORY OF PRESENT ILLNESS: This is a 63 y.o. female here for follow-up of multiple chronic medical conditions including hypertension Has history of gout.  Sees rheumatologist on regular basis History of dyslipidemia and prediabetes Overall doing well.  Has no other complaints or medical concerns today. BP Readings from Last 3 Encounters:  06/27/23 136/79  04/23/23 130/60  12/28/22 138/70   Wt Readings from Last 3 Encounters:  06/27/23 222 lb (100.7 kg)  04/23/23 229 lb (103.9 kg)  12/28/22 225 lb 9.6 oz (102.3 kg)     HPI   Prior to Admission medications   Medication Sig Start Date End Date Taking? Authorizing Provider  allopurinol (ZYLOPRIM) 100 MG tablet Take 100 mg by mouth daily.   Yes [provider]  diclofenac  Sodium (VOLTAREN ) 1 % GEL Apply 2 g topically 4 (four) times daily. 02/03/20  Yes Just, Kelsea J, FNP  olmesartan  (BENICAR ) 40 MG tablet TAKE 1 TABLET BY MOUTH EVERY DAY 11/29/22  Yes Shamond Skelton, Isidro Margo, MD  rosuvastatin  (CRESTOR ) 10 MG tablet TAKE 1 TABLET BY MOUTH EVERY DAY 09/09/22  Yes Curtiss Mahmood, Isidro Margo, MD  Turmeric POWD 1,950 mg by Does not apply route 3 (three) times daily.   Yes [provider]  ipratropium (ATROVENT ) 0.03 % nasal spray USE 2 SPRAYS INTO BOTH NOSTRILS EVERY 12 HOURS Patient not taking: Reported on 06/27/2023 07/24/21   Toniann Dickerson Jose, MD  spironolactone  (ALDACTONE ) 50 MG tablet Take 1 tablet (50 mg total) by mouth daily. 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   Morbid obesity (HCC) 08/18/2013   Essential hypertension 08/07/2008    Past Medical History:  Diagnosis Date   Allergy    Anxiety    Arthritis    Chest pain 08/03/08   Urgent care visit   Depression    Dyspnea    Edema    Heart murmur    Hypertension    Obesity     Past Surgical History:  Procedure Laterality Date   CESAREAN SECTION  04/16/1980   DILATION AND CURETTAGE OF UTERUS     laparoscopic    Social History   Socioeconomic History   Marital status: Single    Spouse name: Not on file   Number of children: 1   Years of education: Not on file   Highest education level: 12th grade  Occupational History  Occupation: 3 jobs    Comment: International aid/development worker  Tobacco Use   Smoking status: Never   Smokeless tobacco: Never   Tobacco comments:    Nonsmoker  Substance and Sexual Activity   Alcohol use: No    Alcohol/week: 0.0 standard drinks of alcohol   Drug use: No   Sexual activity: Yes    Birth control/protection: None  Other Topics Concern   Not on file  Social History Narrative   Lives with mother   Marital status: no    Children: 1 daughter   Grandchildren - 2 (granddaughter and grandson)   Lives with: alone   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   Tobacco:  none   Alcohol:  none   Drugs:  none   Exercise:  Active job   Seatbelt: 100%   Guns in home: none       Filed for bankruptcy in 2010      Social Drivers of Health   Financial Resource Strain: Low Risk  (06/23/2023)   Overall Financial Resource Strain (CARDIA)    Difficulty of Paying Living Expenses: Not hard at all  Food Insecurity: No Food Insecurity (06/23/2023)   Hunger Vital Sign    Worried About Running Out of Food in the Last Year: Never true    Ran Out of Food in the Last Year: Never true  Transportation Needs: No Transportation Needs (06/23/2023)   PRAPARE - Administrator, Civil Service (Medical): No    Lack of Transportation (Non-Medical): No  Physical Activity: Insufficiently Active (06/23/2023)   Exercise Vital Sign    Days of Exercise per Week: 5 days    Minutes of Exercise per Session: 20 min  Stress: No Stress Concern Present (06/23/2023)   [employer] of Occupational Health - Occupational Stress Questionnaire    Feeling of Stress : Only a little  Social Connections: Socially Isolated (06/23/2023)   Social Connection and Isolation Panel [NHANES]    Frequency of Communication with Friends and Family: Once a week    Frequency of Social Gatherings with Friends and Family: Once a week    Attends Religious Services: Never    Database administrator or Organizations: No    Attends Engineer, structural: Not on file    Marital Status: Never married  Catering manager Violence: Not on file    Family History  Problem Relation Age of Onset   Prostate cancer Father    Congestive Heart Failure Father    Hypertension Father    Colon cancer Maternal Uncle    Colon polyps Mother    Breast cancer Mother 35       Left   Hyperlipidemia Mother    Hypertension Mother    Hypertension Sister    Hypertension Brother    Hypertension Sister    Hypertension Sister    Hypertension Sister    Hyperlipidemia Maternal Grandmother    Hypertension Maternal Grandmother    Esophageal cancer Neg Hx    Stomach cancer Neg Hx    Rectal cancer Neg Hx      Review  of Systems  Constitutional: Negative.  Negative for chills and fever.  HENT: Negative.  Negative for congestion and sore throat.   Respiratory: Negative.  Negative for cough and shortness of breath.   Cardiovascular: Negative.  Negative for chest pain and palpitations.  Gastrointestinal:  Negative for abdominal pain, diarrhea, nausea and vomiting.  Genitourinary: Negative.  Negative for dysuria and hematuria.  Skin: Negative.  Negative for rash.  Neurological: Negative.  Negative for dizziness and headaches.  All other systems reviewed and are negative.   Vitals:   06/27/23 0815  BP: 136/79  Pulse: 76  Temp: 99.1 F (37.3 C)  SpO2: 98%    Physical Exam Vitals reviewed.  Constitutional:      Appearance: Normal appearance. She is obese.  HENT:     Head: Normocephalic.     Mouth/Throat:     Mouth: Mucous membranes are moist.     Pharynx: Oropharynx is clear.  Eyes:     Extraocular Movements: Extraocular movements intact.     Pupils: Pupils are equal, round, and reactive to light.  Cardiovascular:     Rate and Rhythm: Normal rate and regular rhythm.     Pulses: Normal pulses.     Heart sounds: Normal heart sounds.  Pulmonary:     Effort: Pulmonary effort is normal.     Breath sounds: Normal breath sounds.  Musculoskeletal:     Cervical back: No tenderness.  Lymphadenopathy:     Cervical: No cervical adenopathy.  Skin:    General: Skin is warm and dry.     Capillary Refill: Capillary refill takes less than 2 seconds.  Neurological:     General: No focal deficit present.     Mental Status: She is alert and oriented to person, place, and time.  Psychiatric:        Mood and Affect: Mood normal.        Behavior: Behavior normal.      ASSESSMENT & PLAN: A total of 42 minutes was spent with the patient and counseling/coordination of care regarding preparing for this visit, review of most recent office visit notes, review of multiple chronic medical conditions and their  management, review of all medications, review of most recent bloodwork results, review of health maintenance items, education on nutrition, prognosis, documentation, and need for follow up.   Problem List Items Addressed This Visit       Cardiovascular and Mediastinum   Essential hypertension - Primary   BP Readings from Last 3 Encounters:  06/27/23 136/79  04/23/23 130/60  12/28/22 138/70  Well-controlled hypertension Continue olmesartan  40 mg daily and spironolactone  50 mg daily Cardiovascular risks associated with hypertension discussed       Relevant Orders   CBC with Differential/Platelet   Comprehensive metabolic panel with GFR     Musculoskeletal and Integument   Idiopathic chronic gout of multiple sites with tophus   Clinically stable.  No recent gout flareups Continues daily allopurinol 100 mg Uric acid done today      Relevant Orders   Uric acid     Genitourinary   Stage 3a chronic kidney disease (HCC)   Chronic stable condition Advised to stay well-hydrated and avoid NSAIDs       Relevant Orders   CBC with Differential/Platelet   Comprehensive metabolic panel with GFR     Other   Morbid obesity (HCC)   Diet and nutrition discussed Advised to decrease amount of daily carbohydrate intake and daily calories and increase amount of plant-based protein in her diet Benefits of exercise discussed      Relevant Orders   Hemoglobin A1c   Lipid panel   Dyslipidemia   Diet and nutrition discussed Chronic stable condition Lipid profile done today Continue rosuvastatin  10 mg daily      Relevant Orders   Lipid panel   Prediabetes  Cardiovascular risks associated with diabetes discussed Diet and nutrition discussed Advised to decrease amount of daily carbohydrate intake and daily calories and increase amount of plant-based protein in her diet Benefits of exercise discussed      Relevant Orders   Hemoglobin A1c   Patient Instructions  Health  Maintenance, Female Adopting a healthy lifestyle and getting preventive care are important in promoting health and wellness. Ask your health care provider about: The right schedule for you to have regular tests and exams. Things you can do on your own to prevent diseases and keep yourself healthy. What should I know about diet, weight, and exercise? Eat a healthy diet  Eat a diet that includes plenty of vegetables, fruits, low-fat dairy products, and lean protein. Do not eat a lot of foods that are high in solid fats, added sugars, or sodium. Maintain a healthy weight Body mass index (BMI) is used to identify weight problems. It estimates body fat based on height and weight. Your health care provider can help determine your BMI and help you achieve or maintain a healthy weight. Get regular exercise Get regular exercise. This is one of the most important things you can do for your health. Most adults should: Exercise for at least 150 minutes each week. The exercise should increase your heart rate and make you sweat (moderate-intensity exercise). Do strengthening exercises at least twice a week. This is in addition to the moderate-intensity exercise. Spend less time sitting. Even light physical activity can be beneficial. Watch cholesterol and blood lipids Have your blood tested for lipids and cholesterol at 62 years of age, then have this test every 5 years. Have your cholesterol levels checked more often if: Your lipid or cholesterol levels are high. You are older than 62 years of age. You are at high risk for heart disease. What should I know about cancer screening? Depending on your health history and family history, you may need to have cancer screening at various ages. This may include screening for: Breast cancer. Cervical cancer. Colorectal cancer. Skin cancer. Lung cancer. What should I know about heart disease, diabetes, and high blood pressure? Blood pressure and heart  disease High blood pressure causes heart disease and increases the risk of stroke. This is more likely to develop in people who have high blood pressure readings or are overweight. Have your blood pressure checked: Every 3-5 years if you are 60-52 years of age. Every year if you are 62 years old or older. Diabetes Have regular diabetes screenings. This checks your fasting blood sugar level. Have the screening done: Once every three years after age 57 if you are at a normal weight and have a low risk for diabetes. More often and at a younger age if you are overweight or have a high risk for diabetes. What should I know about preventing infection? Hepatitis B If you have a higher risk for hepatitis B, you should be screened for this virus. Talk with your health care provider to find out if you are at risk for hepatitis B infection. Hepatitis C Testing is recommended for: Everyone born from [birth year] through 1964. Anyone with known risk factors for hepatitis C. Sexually transmitted infections (STIs) Get screened for STIs, including gonorrhea and chlamydia, if: You are sexually active and are younger than 62 years of age. You are older than 62 years of age and your health care provider tells you that you are at risk for this type of infection. Your sexual activity has changed since  you were last screened, and you are at increased risk for chlamydia or gonorrhea. Ask your health care provider if you are at risk. Ask your health care provider about whether you are at high risk for HIV. Your health care provider may recommend a prescription medicine to help prevent HIV infection. If you choose to take medicine to prevent HIV, you should first get tested for HIV. You should then be tested every 3 months for as long as you are taking the medicine. Pregnancy If you are about to stop having your period (premenopausal) and you may become pregnant, seek counseling before you get pregnant. Take 400 to 800  micrograms (mcg) of folic acid every day if you become pregnant. Ask for birth control (contraception) if you want to prevent pregnancy. Osteoporosis and menopause Osteoporosis is a disease in which the bones lose minerals and strength with aging. This can result in bone fractures. If you are 67 years old or older, or if you are at risk for osteoporosis and fractures, ask your health care provider if you should: Be screened for bone loss. Take a calcium  or vitamin D supplement to lower your risk of fractures. Be given hormone replacement therapy (HRT) to treat symptoms of menopause. Follow these instructions at home: Alcohol use Do not drink alcohol if: Your health care provider tells you not to drink. You are pregnant, may be pregnant, or are planning to become pregnant. If you drink alcohol: Limit how much you have to: 0-1 drink a day. Know how much alcohol is in your drink. In the [place] one drink equals one 12 oz bottle of beer (355 mL), one 5 oz glass of wine (148 mL), or one 1 oz glass of hard liquor (44 mL). Lifestyle Do not use any products that contain nicotine or tobacco. These products include cigarettes, chewing tobacco, and vaping devices, such as e-cigarettes. If you need help quitting, ask your health care provider. Do not use street drugs. Do not share needles. Ask your health care provider for help if you need support or information about quitting drugs. General instructions Schedule regular health, dental, and eye exams. Stay current with your vaccines. Tell your health care provider if: You often feel depressed. You have ever been abused or do not feel safe at home. Summary Adopting a healthy lifestyle and getting preventive care are important in promoting health and wellness. Follow your health care provider's instructions about healthy diet, exercising, and getting tested or screened for diseases. Follow your health care provider's instructions on monitoring your  cholesterol and blood pressure. This information is not intended to replace advice given to you by your health care provider. Make sure you discuss any questions you have with your health care provider. Document Revised: 06/14/2020 Document Reviewed: 06/14/2020 Elsevier Patient Education  2024 [employer] Inc[employer]     Maryagnes Small, MD [facility]

## 2023-06-27 NOTE — Assessment & Plan Note (Signed)
 Diet and nutrition discussed Chronic stable condition Lipid profile done today Continue rosuvastatin  10 mg daily

## 2023-06-27 NOTE — Assessment & Plan Note (Signed)
 BP Readings from Last 3 Encounters:  06/27/23 136/79  04/23/23 130/60  12/28/22 138/70  Well-controlled hypertension Continue olmesartan  40 mg daily and spironolactone  50 mg daily Cardiovascular risks associated with hypertension discussed

## 2023-06-27 NOTE — Assessment & Plan Note (Signed)
Diet and nutrition discussed. Advised to decrease amount of daily carbohydrate intake and daily calories and increase amount of plant based protein in her diet Benefits of exercise discussed.

## 2023-07-18 ENCOUNTER — Other Ambulatory Visit: Payer: Self-pay | Admitting: Emergency Medicine

## 2023-07-18 DIAGNOSIS — I10 Essential (primary) hypertension: Secondary | ICD-10-CM

## 2023-08-13 ENCOUNTER — Ambulatory Visit: Admitting: Emergency Medicine

## 2023-08-13 ENCOUNTER — Encounter: Payer: Self-pay | Admitting: Emergency Medicine

## 2023-08-13 VITALS — BP 158/74 | HR 78 | Temp 98.5°F | Ht 59.0 in | Wt 228.0 lb

## 2023-08-13 DIAGNOSIS — R051 Acute cough: Secondary | ICD-10-CM | POA: Diagnosis not present

## 2023-08-13 DIAGNOSIS — R0989 Other specified symptoms and signs involving the circulatory and respiratory systems: Secondary | ICD-10-CM | POA: Diagnosis not present

## 2023-08-13 DIAGNOSIS — R0981 Nasal congestion: Secondary | ICD-10-CM | POA: Diagnosis not present

## 2023-08-13 DIAGNOSIS — J329 Chronic sinusitis, unspecified: Secondary | ICD-10-CM | POA: Diagnosis not present

## 2023-08-13 DIAGNOSIS — B9689 Other specified bacterial agents as the cause of diseases classified elsewhere: Secondary | ICD-10-CM | POA: Insufficient documentation

## 2023-08-13 MED ORDER — AMOXICILLIN-POT CLAVULANATE 875-125 MG PO TABS: 1.0000 | ORAL_TABLET | Freq: Two times a day (BID) | ORAL | 0 refills | Status: AC

## 2023-08-13 MED ORDER — HYDROCODONE BIT-HOMATROP MBR 5-1.5 MG/5ML PO SOLN: 5.0000 mL | Freq: Every evening | ORAL | 0 refills | Status: AC | PRN

## 2023-08-13 MED ORDER — DM-GUAIFENESIN ER 30-600 MG PO TB12: 1.0000 | ORAL_TABLET | Freq: Two times a day (BID) | ORAL | 0 refills | Status: AC

## 2023-08-13 NOTE — Assessment & Plan Note (Signed)
 Recommend saline nasal sprays frequently during the day May use Flonase over-the-counter as needed Recommend Mucinex  DM

## 2023-08-13 NOTE — Assessment & Plan Note (Signed)
 Recommend to rest and stay well-hydrated Recommend Mucinex  DM twice a day

## 2023-08-13 NOTE — Assessment & Plan Note (Signed)
 Cough management discussed Advised to take Mucinex  DM and cough drops Advised to rest and stay well-hydrated May take Hycodan syrup at bedtime as needed

## 2023-08-13 NOTE — Patient Instructions (Signed)

## 2023-08-13 NOTE — Assessment & Plan Note (Signed)
 Primary viral upper respiratory infection with secondary bacterial infection Symptom management discussed Recommend Augmentin  875 mg twice a day for 7 days Advised to rest and stay well-hydrated Advised to contact the office if no better or worse during the next several days

## 2023-08-13 NOTE — Progress Notes (Signed)
 Samantha Pearson 63 y.o.   Chief Complaint  Patient presents with   Sinusitis    Patient states it started last wednesday, she states she's been taking OTC congestion and allergie medication that has not helped with her chest congestion. It did help with her sinus congestion. Patient states her throat is a little itchy and left ear pain. She mentions having a lot of saliva that feels like its building in her chest.     HISTORY OF PRESENT ILLNESS: This is a 63 y.o. female complaining of flulike symptoms that started about 5 days ago Mostly complaining of cough and chest congestion.  Also has sinus congestion No other complaints or medical concerns today.  Sinusitis Associated symptoms include congestion, coughing and ear pain. Pertinent negatives include no chills, headaches or shortness of breath.     Prior to Admission medications   Medication Sig Start Date End Date Taking? Authorizing Provider  allopurinol (ZYLOPRIM) 100 MG tablet Take 100 mg by mouth daily.   Yes [provider]  diclofenac  Sodium (VOLTAREN ) 1 % GEL Apply 2 g topically 4 (four) times daily. 02/03/20  Yes Just, Kelsea J, FNP  olmesartan  (BENICAR ) 40 MG tablet TAKE 1 TABLET BY MOUTH EVERY DAY 11/29/22  Yes Dontario Evetts, Emil Schanz, MD  rosuvastatin  (CRESTOR ) 10 MG tablet TAKE 1 TABLET BY MOUTH EVERY DAY 09/09/22  Yes Mason Dibiasio, Emil Schanz, MD  spironolactone  (ALDACTONE ) 50 MG tablet TAKE 1 TABLET BY MOUTH EVERY DAY 07/18/23  Yes Javier Mamone, Emil Schanz, MD  Turmeric POWD 1,950 mg by Does not apply route 3 (three) times daily.   Yes [provider]  ipratropium (ATROVENT ) 0.03 % nasal spray USE 2 SPRAYS INTO BOTH NOSTRILS EVERY 12 HOURS Patient not taking: Reported on 08/13/2023 07/24/21   Purcell Emil Schanz, MD    Allergies  Allergen Reactions   Chlorthalidone  Other (See Comments)    Severe gout   Doxycycline Hives, Itching and Swelling   Norvasc  [Amlodipine  Besylate] Swelling    Feet swelling   Septra  [Bactrim] Hives, Itching and Swelling   Latex Swelling and Rash    Patient Active Problem List   Diagnosis Date Noted   History of gout 07/27/2021   Prediabetes 07/27/2021   Seasonal allergies 06/15/2021   Dyslipidemia 10/20/2020   Stage 3a chronic kidney disease (HCC) 10/20/2020   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   Morbid obesity (HCC) 08/18/2013   Essential hypertension 08/07/2008    Past Medical History:  Diagnosis Date   Allergy    Anxiety    Arthritis    Chest pain 08/03/08   Urgent care visit   Depression    Dyspnea    Edema    Heart murmur    Hypertension    Obesity     Past Surgical History:  Procedure Laterality Date   CESAREAN SECTION  04/16/1980   DILATION AND CURETTAGE OF UTERUS     laparoscopic    Social History   Socioeconomic History   Marital status: Single    Spouse name: Not on file   Number of children: 1   Years of education: Not on file   Highest education level: 12th grade  Occupational History   Occupation: 3 jobs    Comment: International aid/development worker  Tobacco Use   Smoking status: Never   Smokeless tobacco: Never   Tobacco comments:    Nonsmoker  Substance and Sexual Activity   Alcohol use: No    Alcohol/week: 0.0 standard  drinks of alcohol   Drug use: No   Sexual activity: Yes    Birth control/protection: None  Other Topics Concern   Not on file  Social History Narrative   Lives with mother   Marital status: no    Children: 1 daughter   Grandchildren - 2 (granddaughter and grandson)   Lives with: alone   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   Tobacco:  none   Alcohol:  none   Drugs:  none   Exercise:  Active job   Seatbelt: 100%   Guns in home: none      Filed for bankruptcy in 2010      Social Drivers of Health   Financial Resource Strain: Low Risk  (08/12/2023)   Overall Financial Resource Strain (CARDIA)    Difficulty of Paying Living  Expenses: Not hard at all  Food Insecurity: No Food Insecurity (08/12/2023)   Hunger Vital Sign    Worried About Running Out of Food in the Last Year: Never true    Ran Out of Food in the Last Year: Never true  Transportation Needs: No Transportation Needs (08/12/2023)   PRAPARE - Administrator, Civil Service (Medical): No    Lack of Transportation (Non-Medical): No  Physical Activity: Inactive (08/12/2023)   Exercise Vital Sign    Days of Exercise per Week: 0 days    Minutes of Exercise per Session: Not on file  Stress: No Stress Concern Present (08/12/2023)   [employer] of Occupational Health - Occupational Stress Questionnaire    Feeling of Stress: Only a little  Social Connections: Unknown (08/12/2023)   Social Connection and Isolation Panel    Frequency of Communication with Friends and Family: Once a week    Frequency of Social Gatherings with Friends and Family: Once a week    Attends Religious Services: Never    Database administrator or Organizations: No    Attends Engineer, structural: Not on file    Marital Status: Patient declined  Recent Concern: Social Connections - Socially Isolated (06/23/2023)   Social Connection and Isolation Panel    Frequency of Communication with Friends and Family: Once a week    Frequency of Social Gatherings with Friends and Family: Once a week    Attends Religious Services: Never    Database administrator or Organizations: No    Attends Engineer, structural: Not on file    Marital Status: Never married  Catering manager Violence: Not on file    Family History  Problem Relation Age of Onset   Prostate cancer Father    Congestive Heart Failure Father    Hypertension Father    Colon cancer Maternal Uncle    Colon polyps Mother    Breast cancer Mother 64       Left   Hyperlipidemia Mother    Hypertension Mother    Hypertension Sister    Hypertension Brother    Hypertension Sister    Hypertension Sister     Hypertension Sister    Hyperlipidemia Maternal Grandmother    Hypertension Maternal Grandmother    Esophageal cancer Neg Hx    Stomach cancer Neg Hx    Rectal cancer Neg Hx      Review of Systems  Constitutional: Negative.  Negative for chills and fever.  HENT:  Positive for congestion, ear pain and sinus pain.   Respiratory:  Positive for cough and sputum  production. Negative for shortness of breath.   Cardiovascular:  Negative for chest pain and palpitations.  Gastrointestinal:  Negative for abdominal pain, diarrhea, nausea and vomiting.  Genitourinary: Negative.  Negative for dysuria.  Skin: Negative.  Negative for rash.  Neurological: Negative.  Negative for dizziness and headaches.  All other systems reviewed and are negative.   Vitals:   08/13/23 0930  BP: (!) 158/74  Pulse: 78  Temp: 98.5 F (36.9 C)  SpO2: 95%    Physical Exam Vitals reviewed.  Constitutional:      Appearance: Normal appearance.  HENT:     Head: Normocephalic.     Right Ear: Tympanic membrane, ear canal and external ear normal.     Left Ear: Tympanic membrane, ear canal and external ear normal.     Nose: Congestion present.     Mouth/Throat:     Mouth: Mucous membranes are moist.     Pharynx: Oropharynx is clear.  Eyes:     Extraocular Movements: Extraocular movements intact.     Pupils: Pupils are equal, round, and reactive to light.  Cardiovascular:     Rate and Rhythm: Normal rate and regular rhythm.     Pulses: Normal pulses.     Heart sounds: Normal heart sounds.  Pulmonary:     Effort: Pulmonary effort is normal.     Breath sounds: Normal breath sounds.  Musculoskeletal:     Cervical back: No tenderness.  Lymphadenopathy:     Cervical: No cervical adenopathy.  Skin:    General: Skin is warm and dry.     Capillary Refill: Capillary refill takes less than 2 seconds.  Neurological:     General: No focal deficit present.     Mental Status: She is alert and oriented to person,  place, and time.  Psychiatric:        Mood and Affect: Mood normal.        Behavior: Behavior normal.      ASSESSMENT & PLAN: A total of 32 minutes was spent with the patient and counseling/coordination of care regarding preparing for this visit, review of most recent office visit notes, review of chronic medical conditions under management, review of all medications, diagnosis of sinus infection and need for antibiotics, symptom management, prognosis, documentation, and need for follow-up if no better or worse during the next several days.  Problem List Items Addressed This Visit       Respiratory   Bacterial sinusitis - Primary   Primary viral upper respiratory infection with secondary bacterial infection Symptom management discussed Recommend Augmentin  875 mg twice a day for 7 days Advised to rest and stay well-hydrated Advised to contact the office if no better or worse during the next several days      Relevant Medications   amoxicillin -clavulanate (AUGMENTIN ) 875-125 MG tablet   dextromethorphan-guaiFENesin  (MUCINEX  DM) 30-600 MG 12hr tablet   HYDROcodone  bit-homatropine (HYCODAN) 5-1.5 MG/5ML syrup   Chest congestion   Recommend to rest and stay well-hydrated Recommend Mucinex  DM twice a day      Relevant Medications   dextromethorphan-guaiFENesin  (MUCINEX  DM) 30-600 MG 12hr tablet   Sinus congestion   Recommend saline nasal sprays frequently during the day May use Flonase over-the-counter as needed Recommend Mucinex  DM      Relevant Medications   dextromethorphan-guaiFENesin  (MUCINEX  DM) 30-600 MG 12hr tablet     Other   Acute cough   Cough management discussed Advised to take Mucinex  DM and cough drops Advised to rest and stay  well-hydrated May take Hycodan syrup at bedtime as needed      Relevant Medications   HYDROcodone  bit-homatropine (HYCODAN) 5-1.5 MG/5ML syrup   Patient Instructions  Sinus Infection, Adult A sinus infection is soreness and  swelling (inflammation) of your sinuses. Sinuses are hollow spaces in the bones around your face. They are located: Around your eyes. In the middle of your forehead. Behind your nose. In your cheekbones. Your sinuses and nasal passages are lined with a fluid called mucus. Mucus drains out of your sinuses. Swelling can trap mucus in your sinuses. This lets germs (bacteria, virus, or fungus) grow, which leads to infection. Most of the time, this condition is caused by a virus. What are the causes? Allergies. Asthma. Germs. Things that block your nose or sinuses. Growths in the nose (nasal polyps). Chemicals or irritants in the air. A fungus. This is rare. What increases the risk? Having a weak body defense system (immune system). Doing a lot of swimming or diving. Using nasal sprays too much. Smoking. What are the signs or symptoms? The main symptoms of this condition are pain and a feeling of pressure around the sinuses. Other symptoms include: Stuffy nose (congestion). This may make it hard to breathe through your nose. Runny nose (drainage). Soreness, swelling, and warmth in the sinuses. A cough that may get worse at night. Being unable to smell and taste. Mucus that collects in the throat or the back of the nose (postnasal drip). This may cause a sore throat or bad breath. Being very tired (fatigued). A fever. How is this diagnosed? Your symptoms. Your medical history. A physical exam. Tests to find out if your condition is short-term (acute) or long-term (chronic). Your doctor may: Check your nose for growths (polyps). Check your sinuses using a tool that has a light on one end (endoscope). Check for allergies or germs. Do imaging tests, such as an MRI or CT scan. How is this treated? Treatment for this condition depends on the cause and whether it is short-term or long-term. If caused by a virus, your symptoms should go away on their own within 10 days. You may be given  medicines to relieve symptoms. They include: Medicines that shrink swollen tissue in the nose. A spray that treats swelling of the nostrils. Rinses that help get rid of thick mucus in your nose (nasal saline washes). Medicines that treat allergies (antihistamines). Over-the-counter pain relievers. If caused by bacteria, your doctor may wait to see if you will get better without treatment. You may be given antibiotic medicine if you have: A very bad infection. A weak body defense system. If caused by growths in the nose, surgery may be needed. Follow these instructions at home: Medicines Take, use, or apply over-the-counter and prescription medicines only as told by your doctor. These may include nasal sprays. If you were prescribed an antibiotic medicine, take it as told by your doctor. Do not stop taking it even if you start to feel better. Hydrate and humidify  Drink enough water to keep your pee (urine) pale yellow. Use a cool mist humidifier to keep the humidity level in your home above 50%. Breathe in steam for 10-15 minutes, 3-4 times a day, or as told by your doctor. You can do this in the bathroom while a hot shower is running. Try not to spend time in cool or dry air. Rest Rest as much as you can. Sleep with your head raised (elevated). Make sure you get enough sleep each night.  General instructions  Put a warm, moist washcloth on your face 3-4 times a day, or as often as told by your doctor. Use nasal saline washes as often as told by your doctor. Wash your hands often with soap and water. If you cannot use soap and water, use hand sanitizer. Do not smoke. Avoid being around people who are smoking (secondhand smoke). Keep all follow-up visits. Contact a doctor if: You have a fever. Your symptoms get worse. Your symptoms do not get better within 10 days. Get help right away if: You have a very bad headache. You cannot stop vomiting. You have very bad pain or swelling  around your face or eyes. You have trouble seeing. You feel confused. Your neck is stiff. You have trouble breathing. These symptoms may be an emergency. Get help right away. Call 911. Do not wait to see if the symptoms will go away. Do not drive yourself to the hospital. Summary A sinus infection is swelling of your sinuses. Sinuses are hollow spaces in the bones around your face. This condition is caused by tissues in your nose that become inflamed or swollen. This traps germs. These can lead to infection. If you were prescribed an antibiotic medicine, take it as told by your doctor. Do not stop taking it even if you start to feel better. Keep all follow-up visits. This information is not intended to replace advice given to you by your health care provider. Make sure you discuss any questions you have with your health care provider. Document Revised: 12/28/2020 Document Reviewed: 12/28/2020 Elsevier Patient Education  2024 [employer] Inc.    Emil Schaumann, MD  Primary [facility] at [facility]

## 2023-10-16 ENCOUNTER — Other Ambulatory Visit: Payer: Self-pay | Admitting: Emergency Medicine

## 2023-10-16 DIAGNOSIS — E785 Hyperlipidemia, unspecified: Secondary | ICD-10-CM

## 2023-11-12 DIAGNOSIS — M1712 Unilateral primary osteoarthritis, left knee: Secondary | ICD-10-CM | POA: Diagnosis not present

## 2023-11-12 NOTE — Progress Notes (Signed)
 Samantha Pearson is in today as a new patient coming in for orthopedic follow-up.  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  She has been a patient at [facility] and has been seeing Dr. Tess for multiple rounds of cortisone injections.  The most recent injection did not help.  She has a friend who has responded well to lubrication injections and she is interesting in discussing that she is a candidate for that.  She is in today for follow-up.  On examination 63 year old female in no acute distress.  She is alert and oriented x 3.  Exam of the head is normocephalic atraumatic.  Neck is supple no meningismus.  Musculoskeletal exam of the left knee medial joint line tenderness mild varus deformity 5 degree flexion contracture moderate amount of crepitus noted.  Skin is warm dry intact no signs of infection.  Impression: 4 view x-ray taken today shows moderate to severe tricompartmental arthritic changes of the left knee.  She has failed rest ice anti-inflammatories cortisone injection.  Her knee is affecting her activities of daily living.  She would like to submit authorization for lubrication injection.  Referral placed and we will follow-up with her for no charge injection only visit once approved.

## 2023-11-22 ENCOUNTER — Ambulatory Visit: Payer: Self-pay

## 2023-11-22 ENCOUNTER — Other Ambulatory Visit: Payer: Self-pay

## 2023-11-22 ENCOUNTER — Emergency Department (HOSPITAL_BASED_OUTPATIENT_CLINIC_OR_DEPARTMENT_OTHER)
Admission: EM | Admit: 2023-11-22 | Discharge: 2023-11-22 | Disposition: A | Discharge status: Home / Self Care | Attending: Emergency Medicine | Admitting: Emergency Medicine

## 2023-11-22 DIAGNOSIS — R030 Elevated blood-pressure reading, without diagnosis of hypertension: Secondary | ICD-10-CM

## 2023-11-22 DIAGNOSIS — Z91040 Latex allergy status: Secondary | ICD-10-CM | POA: Insufficient documentation

## 2023-11-22 DIAGNOSIS — R0981 Nasal congestion: Secondary | ICD-10-CM

## 2023-11-22 DIAGNOSIS — I129 Hypertensive chronic kidney disease with stage 1 through stage 4 chronic kidney disease, or unspecified chronic kidney disease: Secondary | ICD-10-CM | POA: Diagnosis not present

## 2023-11-22 DIAGNOSIS — R42 Dizziness and giddiness: Secondary | ICD-10-CM | POA: Insufficient documentation

## 2023-11-22 DIAGNOSIS — J069 Acute upper respiratory infection, unspecified: Secondary | ICD-10-CM | POA: Diagnosis not present

## 2023-11-22 DIAGNOSIS — R062 Wheezing: Secondary | ICD-10-CM

## 2023-11-22 DIAGNOSIS — Z79899 Other long term (current) drug therapy: Secondary | ICD-10-CM | POA: Diagnosis not present

## 2023-11-22 DIAGNOSIS — N189 Chronic kidney disease, unspecified: Secondary | ICD-10-CM | POA: Diagnosis not present

## 2023-11-22 DIAGNOSIS — I10 Essential (primary) hypertension: Secondary | ICD-10-CM | POA: Diagnosis not present

## 2023-11-22 DIAGNOSIS — R059 Cough, unspecified: Secondary | ICD-10-CM | POA: Diagnosis present

## 2023-11-22 LAB — URINALYSIS, ROUTINE W REFLEX MICROSCOPIC
Bilirubin Urine: NEGATIVE
Glucose, UA: NEGATIVE mg/dL
Hgb urine dipstick: NEGATIVE
Ketones, ur: NEGATIVE mg/dL
Leukocytes,Ua: NEGATIVE
Nitrite: NEGATIVE
Protein, ur: NEGATIVE mg/dL
Specific Gravity, Urine: 1.005 (ref 1.005–1.030)
pH: 6 (ref 5.0–8.0)

## 2023-11-22 LAB — COMPREHENSIVE METABOLIC PANEL WITH GFR
ALT: 19 U/L (ref 0–44)
AST: 27 U/L (ref 15–41)
Albumin: 4.4 g/dL (ref 3.5–5.0)
Alkaline Phosphatase: 105 U/L (ref 38–126)
Anion gap: 11 (ref 5–15)
BUN: 16 mg/dL (ref 8–23)
CO2: 25 mmol/L (ref 22–32)
Calcium: 9.9 mg/dL (ref 8.9–10.3)
Chloride: 101 mmol/L (ref 98–111)
Creatinine, Ser: 0.93 mg/dL (ref 0.44–1.00)
GFR, Estimated: >60 mL/min (ref >60)
Glucose, Bld: 90 mg/dL (ref 70–99)
Potassium: 4.1 mmol/L (ref 3.5–5.1)
Sodium: 136 mmol/L (ref 135–145)
Total Bilirubin: 0.9 mg/dL (ref 0.0–1.2)
Total Protein: 7.8 g/dL (ref 6.5–8.1)

## 2023-11-22 LAB — CBC
HCT: 37.6 % (ref 36.0–46.0)
Hemoglobin: 11.8 g/dL — ABNORMAL LOW (ref 12.0–15.0)
MCH: 27.3 pg (ref 26.0–34.0)
MCHC: 31.4 g/dL (ref 30.0–36.0)
MCV: 87 fL (ref 80.0–100.0)
Platelets: 243 K/uL (ref 150–400)
RBC: 4.32 MIL/uL (ref 3.87–5.11)
RDW: 14 % (ref 11.5–15.5)
WBC: 6.3 K/uL (ref 4.0–10.5)
nRBC: 0 % (ref 0.0–0.2)

## 2023-11-22 LAB — CBG MONITORING, ED: Glucose-Capillary: 82 mg/dL (ref 70–99)

## 2023-11-22 MED ORDER — ALBUTEROL SULFATE HFA 108 (90 BASE) MCG/ACT IN AERS
2.0000 | INHALATION_SPRAY | Freq: Once | RESPIRATORY_TRACT | Status: AC
  Administered 2023-11-22: 2 via RESPIRATORY_TRACT
  Filled 2023-11-22: qty 6.7

## 2023-11-22 MED ORDER — HYDRALAZINE HCL 10 MG PO TABS
10.0000 mg | ORAL_TABLET | Freq: Once | ORAL | Status: AC
  Administered 2023-11-22: 10 mg via ORAL
  Filled 2023-11-22: qty 1

## 2023-11-22 NOTE — ED Provider Notes (Signed)
 Samantha Pearson EMERGENCY DEPARTMENT AT [facility] Provider Note   CSN: [id number] Arrival date & time: 11/22/23  1507     Patient presents with: Hypertension   Samantha Pearson is a 63 y.o. female.   The history is provided by the patient and medical records. No language interpreter was used.  Hypertension This is a chronic problem. The problem occurs constantly. The problem has not changed since onset.Associated symptoms include headaches (resolved now). Pertinent negatives include no chest pain, no abdominal pain and no shortness of breath. Nothing aggravates the symptoms. Nothing relieves the symptoms. She has tried nothing for the symptoms. The treatment provided no relief.       Prior to Admission medications   Medication Sig Start Date End Date Taking? Authorizing Provider  allopurinol (ZYLOPRIM) 100 MG tablet Take 100 mg by mouth daily.    [provider]  diclofenac  Sodium (VOLTAREN ) 1 % GEL Apply 2 g topically 4 (four) times daily. 02/03/20   Just, Kelsea J, FNP  HYDROcodone  bit-homatropine (HYCODAN) 5-1.5 MG/5ML syrup Take 5 mLs by mouth at bedtime as needed for cough. 08/13/23   Sagardia, Miguel Jose, MD  ipratropium (ATROVENT ) 0.03 % nasal spray USE 2 SPRAYS INTO BOTH NOSTRILS EVERY 12 HOURS Patient not taking: Reported on 08/13/2023 07/24/21   Sagardia, Miguel Jose, MD  olmesartan  (BENICAR ) 40 MG tablet TAKE 1 TABLET BY MOUTH EVERY DAY 11/29/22   Sagardia, Miguel Jose, MD  rosuvastatin  (CRESTOR ) 10 MG tablet TAKE 1 TABLET BY MOUTH EVERY DAY 10/16/23   Sagardia, Miguel Jose, MD  spironolactone  (ALDACTONE ) 50 MG tablet TAKE 1 TABLET BY MOUTH EVERY DAY 07/18/23   Purcell Emil Schanz, MD  Turmeric POWD 1,950 mg by Does not apply route 3 (three) times daily.    [provider]    Allergies: Chlorthalidone , Doxycycline, Norvasc  [amlodipine  besylate], Septra [bactrim], and Latex    Review of Systems  Constitutional:  Negative for chills, fatigue and fever.   HENT:  Positive for congestion, sinus pressure and sinus pain.   Eyes:  Negative for pain and visual disturbance.  Respiratory:  Negative for cough, chest tightness and shortness of breath.   Cardiovascular:  Negative for chest pain.  Gastrointestinal:  Positive for diarrhea. Negative for abdominal pain, constipation, nausea and vomiting.  Genitourinary:  Negative for dysuria and flank pain.  Musculoskeletal:  Negative for back pain, neck pain and neck stiffness.  Skin:  Negative for rash and wound.  Neurological:  Positive for headaches (resolved now). Negative for dizziness, seizures, syncope, speech difficulty, weakness, light-headedness and numbness.  Psychiatric/Behavioral:  Negative for agitation and confusion.   All other systems reviewed and are negative.   Updated Vital Signs BP (!) 175/72   Pulse 61   Temp 98.3 F (36.8 C)   Resp 18   LMP 04/09/2014   SpO2 99%   Physical Exam Vitals and nursing note reviewed.  Constitutional:      General: She is not in acute distress.    Appearance: She is well-developed. She is not ill-appearing, toxic-appearing or diaphoretic.  HENT:     Head: Atraumatic.     Right Ear: External ear normal.     Left Ear: External ear normal.     Nose: Congestion present.     Mouth/Throat:     Mouth: Mucous membranes are moist.     Pharynx: No oropharyngeal exudate or posterior oropharyngeal erythema.  Eyes:     Extraocular Movements: Extraocular movements intact.     Conjunctiva/sclera: Conjunctivae  normal.     Pupils: Pupils are equal, round, and reactive to light.  Cardiovascular:     Rate and Rhythm: Normal rate.     Pulses: Normal pulses.  Pulmonary:     Effort: Pulmonary effort is normal. No respiratory distress.     Breath sounds: No stridor. No wheezing, rhonchi or rales.  Chest:     Chest wall: No tenderness.  Abdominal:     General: There is no distension.     Tenderness: There is no abdominal tenderness. There is no guarding  or rebound.  Musculoskeletal:        General: No tenderness.     Cervical back: Normal range of motion and neck supple. No tenderness.     Right lower leg: No edema.     Left lower leg: No edema.  Skin:    General: Skin is warm.     Findings: No erythema or rash.  Neurological:     General: No focal deficit present.     Mental Status: She is alert and oriented to person, place, and time.     Sensory: No sensory deficit.     Motor: No weakness or abnormal muscle tone.     Deep Tendon Reflexes: Reflexes are normal and symmetric.  Psychiatric:        Mood and Affect: Mood normal.     (all labs ordered are listed, but only abnormal results are displayed) Labs Reviewed  URINALYSIS, ROUTINE W REFLEX MICROSCOPIC - Abnormal; Notable for the following components:      Result Value   Color, Urine COLORLESS (*)    All other components within normal limits  CBC - Abnormal; Notable for the following components:   Hemoglobin 11.8 (*)    All other components within normal limits  COMPREHENSIVE METABOLIC PANEL WITH GFR  CBG MONITORING, ED    EKG: EKG Interpretation Date/Time:  Thursday November 22 2023 15:20:42 EDT Ventricular Rate:  74 PR Interval:  174 QRS Duration:  64 QT Interval:  362 QTC Calculation: 401 R Axis:   51  Text Interpretation: Sinus rhythm with Premature atrial complexes Otherwise normal ECG No previous ECGs available no prior ECG for comparison NO STEMI Confirmed by Ginger Barefoot ([id number]) on 11/22/2023 5:43:34 PM  Radiology: No results found.   Procedures   Medications Ordered in the ED  hydrALAZINE (APRESOLINE) tablet 10 mg (10 mg Oral Given 11/22/23 2133)  albuterol  (VENTOLIN  HFA) 108 (90 Base) MCG/ACT inhaler 2 puff (2 puffs Inhalation Given 11/22/23 2120)                                    Medical Decision Making Amount and/or Complexity of Data Reviewed Labs: ordered.  Risk Prescription drug management.    Samantha Pearson is a 63 y.o. female  with a past medical history significant for hypertension, gout, CKD, previous nosebleeds, and dyslipidemia who presents with congestion, cough, sinus pressure, epistaxis, and elevated blood pressure.  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  She denies sick contacts but is uncertain as she is around new people.  She reports no nausea, vomiting, constipation or urinary changes.  She reports a mild diarrhea.  Denies any chest pain or abdominal pain but has some congestion in her chest  as well.  Denies any shortness of breath but does state she has had some wheezing.  She denies any fevers or chills.  She denies any neck pain or neck stiffness and denies significant headache.  She reports it was mainly pressure in her sinuses and forehead that has improved now.  She denies any neurological plaints including no numbness, tingling, weakness of extremities.  Denies any vision changes, speech changes, or room spinning dizziness.  Patient reportedly had elevated blood pressure at work so she was in here for evaluation.  She did not take her evening blood pressure medication due to this.  On exam, patient has no active epistaxis or dried epistaxis on my evaluation.  Oropharyngeal exam unremarkable.  Some congestion seen but she was not having any tenderness on her maxillary sinus or frontal sinuses.  She reports earlier today they were sore but now they are not.  She had intact extraocular movements and pupils are symmetric and reactive.  Symmetric smile.  Clear speech.  Neck nontender.  Normal range of motion.  Lungs had some very faint wheezing but no rhonchi or rales.  Chest nontender.  Abdomen nontender.  Good pulses in extremities.  Patient resting comfortably with no distress.  Heart rate is in the 60s and oxygen saturations are normal.  Blood pressure is  elevated.  We had a shared decision-making conversation discussing that I suspect she has sinus irritation and epistaxis related to some of the environmental changes and she agrees.  As she is not tender in her sinuses whatsoever and having no headache now we agreed to hold on CT imaging of the head and face and she agrees.  She also does not think she has COVID or flu given lack of sick contacts and her symptoms.  We agreed to go to hold on swab and chest x-ray at this time.  With the faint wheezing we will give some albuterol  and with her blood pressure and heart rate of about 60 will give her a dose of hydralazine.  We agreed to hold on more extensive workup as her other labs are reassuring.  We discussed other workup and patient does not want this.  Low suspicion for intracranial hemorrhage given her lack of headache now and reassuring exam.  Anticipate reassessment after 2+ albuterol  and the hydralazine and if she continues to feel well, anticipate discharge.  Patient feeling much better after albuterol  and blood pressure has improved.  Patient would like to go home.  She will take her home blood pressure medicine and follow-up with her primary doctor.  Suspect viral URI contributing to her congestion symptoms and given her lack of pain or tenderness on exam we do not feel she has sinusitis needing antibiotics and patient agrees.  Patient does not want burst of steroids given the wheezing but will follow-up with her primary team.  She had no other questions or concerns and was discharged with resolution of presenting symptoms and improvement in her blood pressure.      Final diagnoses:  Nasal congestion  Wheezes  Upper respiratory tract infection, unspecified type  Elevated blood pressure reading    ED Discharge Orders     None       Clinical Impression: 1. Nasal congestion   2. Wheezes   3. Upper respiratory tract infection, unspecified type   4. Elevated blood pressure  reading     Disposition: Discharge  Condition: Good  I have discussed the results, Dx and Tx plan with the pt(&  family if present). He/she/they expressed understanding and agree(s) with the plan. Discharge instructions discussed at great length. Strict return precautions discussed and pt &/or family have verbalized understanding of the instructions. No further questions at time of discharge.    New Prescriptions   No medications on file    Follow Up: Purcell Emil Schanz, MD [address] [place] [address] [phone]     [facility] Emergency Department at [facility] [address] [place]  [id number] [phone]        Samantha Pearson, Lonni PARAS, MD 11/22/23 [phone]

## 2023-11-22 NOTE — Telephone Encounter (Signed)
 FYI Only or Action Required?: FYI only for provider.  Patient was last seen in primary care on 08/13/2023 by Purcell Emil Schanz, MD.  Called Nurse Triage reporting Hypertension.  Symptoms began today.  Interventions attempted: Nothing.  Symptoms are: gradually worsening.  Triage Disposition: Go to ED Now (Notify PCP)  Patient/caregiver understands and will follow disposition?: Yes RN advising ED. Pt is agreeable, says she will go to [facility].   Copied from CRM #[id number]. Topic: Clinical - Red Word Triage >> Nov 22, 2023  2:40 PM Dedra B wrote: Red Word that prompted transfer to Nurse Triage: Pt has a bad headache and elevated BP readings 179/99, 168-99. She also had a nose bleed but it has subsided. Warm transfer to NT. Reason for Disposition  [1] Systolic BP >= 160 OR Diastolic >= 100 AND [2] cardiac (e.g., breathing difficulty, chest pain) or neurologic symptoms (e.g., new-onset blurred or double vision, unsteady gait)  Answer Assessment - Initial Assessment Questions 1. BLOOD PRESSURE: What is your blood pressure? Did you take at least two measurements 5 minutes apart?     179/99 then 168/99  2. ONSET: When did you take your blood pressure?     Taken today  3. HOW: How did you take your blood pressure? (e.g., automatic home BP monitor, visiting nurse)     Home BP  4. HISTORY: Do you have a history of high blood pressure?     Has history of HTN  5. MEDICINES: Are you taking any medicines for blood pressure? Have you missed any doses recently?     Olmesartan  and spironolactone ; has not missed any doses but takes at different times each day  6. OTHER SYMPTOMS: Do you have any symptoms? (e.g., blurred vision, chest pain, difficulty breathing, headache, weakness)     Headache, nose bleeds, sinus pressure  7. PREGNANCY: Is there any chance you are pregnant? When was your last menstrual period?     No  Protocols used: Blood Pressure - High-A-AH

## 2023-11-22 NOTE — ED Triage Notes (Signed)
 Patient states seen by a nurse at her work who checked her BP. 168/99 at work.Takes BP medications at home. Called her PCP who recommended. Endorses dizziness, lightheadedness, and headaches.

## 2023-11-22 NOTE — Discharge Instructions (Signed)
 Your history, exam, and evaluation today seem consistent with a likely viral posterior infection leading to your sinus pressure congestion and some shortness of breath.  He did have some wheezing so please use the inhaler at home.  Your blood pressure was improved with medications and we feel you are safe for discharge home.  We had a shared decision-making conversation and agreed to hold on extensive further lab or imaging workup today.  Please follow-up with your primary doctor and if any symptoms change or worsen acutely, return to the nearest emergency department.

## 2023-11-26 ENCOUNTER — Ambulatory Visit: Admitting: Emergency Medicine

## 2023-11-26 ENCOUNTER — Encounter: Payer: Self-pay | Admitting: Emergency Medicine

## 2023-11-26 VITALS — BP 164/94 | HR 73 | Temp 98.1°F | Ht 59.0 in | Wt 233.2 lb

## 2023-11-26 DIAGNOSIS — F418 Other specified anxiety disorders: Secondary | ICD-10-CM | POA: Diagnosis not present

## 2023-11-26 DIAGNOSIS — I10 Essential (primary) hypertension: Secondary | ICD-10-CM

## 2023-11-26 DIAGNOSIS — N1831 Chronic kidney disease, stage 3a: Secondary | ICD-10-CM

## 2023-11-26 DIAGNOSIS — E785 Hyperlipidemia, unspecified: Secondary | ICD-10-CM

## 2023-11-26 DIAGNOSIS — R7303 Prediabetes: Secondary | ICD-10-CM

## 2023-11-26 DIAGNOSIS — Z09 Encounter for follow-up examination after completed treatment for conditions other than malignant neoplasm: Secondary | ICD-10-CM

## 2023-11-26 NOTE — Patient Instructions (Signed)
 Continue losartan 40 mg daily Increase Aldactone  50 mg twice a day Monitor blood pressure readings at home daily for the next several weeks and contact the office if numbers persistently abnormal Follow-up in 3 months.  Earlier as needed  Hypertension, Adult High blood pressure (hypertension) is when the force of blood pumping through the arteries is too strong. The arteries are the blood vessels that carry blood from the heart throughout the body. Hypertension forces the heart to work harder to pump blood and may cause arteries to become narrow or stiff. Untreated or uncontrolled hypertension can lead to a heart attack, heart failure, a stroke, kidney disease, and other problems. A blood pressure reading consists of a higher number over a lower number. Ideally, your blood pressure should be below 120/80. The first (top) number is called the systolic pressure. It is a measure of the pressure in your arteries as your heart beats. The second (bottom) number is called the diastolic pressure. It is a measure of the pressure in your arteries as the heart relaxes. What are the causes? The exact cause of this condition is not known. There are some conditions that result in high blood pressure. What increases the risk? Certain factors may make you more likely to develop high blood pressure. Some of these risk factors are under your control, including: Smoking. Not getting enough exercise or physical activity. Being overweight. Having too much fat, sugar, calories, or salt (sodium) in your diet. Drinking too much alcohol. Other risk factors include: Having a personal history of heart disease, diabetes, high cholesterol, or kidney disease. Stress. Having a family history of high blood pressure and high cholesterol. Having obstructive sleep apnea. Age. The risk increases with age. What are the signs or symptoms? High blood pressure may not cause symptoms. Very high blood pressure (hypertensive  crisis) may cause: Headache. Fast or irregular heartbeats (palpitations). Shortness of breath. Nosebleed. Nausea and vomiting. Vision changes. Severe chest pain, dizziness, and seizures. How is this diagnosed? This condition is diagnosed by measuring your blood pressure while you are seated, with your arm resting on a flat surface, your legs uncrossed, and your feet flat on the floor. The cuff of the blood pressure monitor will be placed directly against the skin of your upper arm at the level of your heart. Blood pressure should be measured at least twice using the same arm. Certain conditions can cause a difference in blood pressure between your right and left arms. If you have a high blood pressure reading during one visit or you have normal blood pressure with other risk factors, you may be asked to: Return on a different day to have your blood pressure checked again. Monitor your blood pressure at home for 1 week or longer. If you are diagnosed with hypertension, you may have other blood or imaging tests to help your health care provider understand your overall risk for other conditions. How is this treated? This condition is treated by making healthy lifestyle changes, such as eating healthy foods, exercising more, and reducing your alcohol intake. You may be referred for counseling on a healthy diet and physical activity. Your health care provider may prescribe medicine if lifestyle changes are not enough to get your blood pressure under control and if: Your systolic blood pressure is above 130. Your diastolic blood pressure is above 80. Your personal target blood pressure may vary depending on your medical conditions, your age, and other factors. Follow these instructions at home: Eating and drinking  Eat  a diet that is high in fiber and potassium, and low in sodium, added sugar, and fat. An example of this eating plan is called the DASH diet. DASH stands for Dietary Approaches to Stop  Hypertension. To eat this way: Eat plenty of fresh fruits and vegetables. Try to fill one half of your plate at each meal with fruits and vegetables. Eat whole grains, such as whole-wheat pasta, brown rice, or whole-grain bread. Fill about one fourth of your plate with whole grains. Eat or drink low-fat dairy products, such as skim milk or low-fat yogurt. Avoid fatty cuts of meat, processed or cured meats, and poultry with skin. Fill about one fourth of your plate with lean proteins, such as fish, chicken without skin, beans, eggs, or tofu. Avoid pre-made and processed foods. These tend to be higher in sodium, added sugar, and fat. Reduce your daily sodium intake. Many people with hypertension should eat less than 1,500 mg of sodium a day. Do not drink alcohol if: Your health care provider tells you not to drink. You are pregnant, may be pregnant, or are planning to become pregnant. If you drink alcohol: Limit how much you have to: 0-1 drink a day for women. 0-2 drinks a day for men. Know how much alcohol is in your drink. In the [place] one drink equals one 12 oz bottle of beer (355 mL), one 5 oz glass of wine (148 mL), or one 1 oz glass of hard liquor (44 mL). Lifestyle  Work with your health care provider to maintain a healthy body weight or to lose weight. Ask what an ideal weight is for you. Get at least 30 minutes of exercise that causes your heart to beat faster (aerobic exercise) most days of the week. Activities may include walking, swimming, or biking. Include exercise to strengthen your muscles (resistance exercise), such as Pilates or lifting weights, as part of your weekly exercise routine. Try to do these types of exercises for 30 minutes at least 3 days a week. Do not use any products that contain nicotine or tobacco. These products include cigarettes, chewing tobacco, and vaping devices, such as e-cigarettes. If you need help quitting, ask your health care provider. Monitor your  blood pressure at home as told by your health care provider. Keep all follow-up visits. This is important. Medicines Take over-the-counter and prescription medicines only as told by your health care provider. Follow directions carefully. Blood pressure medicines must be taken as prescribed. Do not skip doses of blood pressure medicine. Doing this puts you at risk for problems and can make the medicine less effective. Ask your health care provider about side effects or reactions to medicines that you should watch for. Contact a health care provider if you: Think you are having a reaction to a medicine you are taking. Have headaches that keep coming back (recurring). Feel dizzy. Have swelling in your ankles. Have trouble with your vision. Get help right away if you: Develop a severe headache or confusion. Have unusual weakness or numbness. Feel faint. Have severe pain in your chest or abdomen. Vomit repeatedly. Have trouble breathing. These symptoms may be an emergency. Get help right away. Call 911. Do not wait to see if the symptoms will go away. Do not drive yourself to the hospital. Summary Hypertension is when the force of blood pumping through your arteries is too strong. If this condition is not controlled, it may put you at risk for serious complications. Your personal target blood pressure may vary  depending on your medical conditions, your age, and other factors. For most people, a normal blood pressure is less than 120/80. Hypertension is treated with lifestyle changes, medicines, or a combination of both. Lifestyle changes include losing weight, eating a healthy, low-sodium diet, exercising more, and limiting alcohol. This information is not intended to replace advice given to you by your health care provider. Make sure you discuss any questions you have with your health care provider. Document Revised: 11/30/2020 Document Reviewed: 11/30/2020 Elsevier Patient Education  2024  [employer].

## 2023-11-26 NOTE — Assessment & Plan Note (Addendum)
 Active and affecting quality of life Known triggers and temporary situation at work Mental health management discussed

## 2023-11-26 NOTE — Assessment & Plan Note (Signed)
 Diet and nutrition discussed Chronic stable condition Continue rosuvastatin  10 mg daily The 10-year ASCVD risk score (Arnett DK, et al., 2019) is: 12.2%   Values used to calculate the score:     Age: 63 years     Clincally relevant sex: Female     Is Non-Hispanic African American: Yes     Diabetic: No     Tobacco smoker: No     Systolic Blood Pressure: 164 mmHg     Is BP treated: Yes     HDL Cholesterol: 51.2 mg/dL     Total Cholesterol: 157 mg/dL

## 2023-11-26 NOTE — Progress Notes (Signed)
 Samantha Pearson 63 y.o.   Chief Complaint  Patient presents with   Hospitalization Follow-up    Patient has nothing to discuss hospital f/u about. But wants to discuss stress and blood pressure.     HISTORY OF PRESENT ILLNESS: This is a 63 y.o. female here for follow-up of emergency department visit when she presented on 11/22/2023 with hypertension Increased stress at work No other complaints or medical concerns today BP Readings from Last 3 Encounters:  11/26/23 (!) 164/94  11/22/23 104/82  08/13/23 (!) 158/74     HPI   Prior to Admission medications   Medication Sig Start Date End Date Taking? Authorizing Provider  allopurinol (ZYLOPRIM) 100 MG tablet Take 100 mg by mouth daily.   Yes [provider]  diclofenac  Sodium (VOLTAREN ) 1 % GEL Apply 2 g topically 4 (four) times daily. 02/03/20  Yes Just, Kelsea J, FNP  HYDROcodone  bit-homatropine (HYCODAN) 5-1.5 MG/5ML syrup Take 5 mLs by mouth at bedtime as needed for cough. 08/13/23  Yes Oralia Criger, Emil Schanz, MD  olmesartan  (BENICAR ) 40 MG tablet TAKE 1 TABLET BY MOUTH EVERY DAY 11/29/22  Yes Anirudh Baiz, Emil Schanz, MD  rosuvastatin  (CRESTOR ) 10 MG tablet TAKE 1 TABLET BY MOUTH EVERY DAY 10/16/23  Yes Jaquarious Grey, Emil Schanz, MD  spironolactone  (ALDACTONE ) 50 MG tablet TAKE 1 TABLET BY MOUTH EVERY DAY 07/18/23  Yes Bayani Renteria, Emil Schanz, MD  Turmeric POWD 1,950 mg by Does not apply route 3 (three) times daily.   Yes [provider]  ipratropium (ATROVENT ) 0.03 % nasal spray USE 2 SPRAYS INTO BOTH NOSTRILS EVERY 12 HOURS Patient not taking: Reported on 08/13/2023 07/24/21   Purcell Emil Schanz, MD    Allergies  Allergen Reactions   Chlorthalidone  Other (See Comments)    Severe gout   Doxycycline Hives, Itching and Swelling   Norvasc  [Amlodipine  Besylate] Swelling    Feet swelling   Septra [Bactrim] Hives, Itching and Swelling   Latex Swelling and Rash    Patient Active Problem List   Diagnosis Date Noted    Bacterial sinusitis 08/13/2023   Chest congestion 08/13/2023   Sinus congestion 08/13/2023   Acute cough 08/13/2023   History of gout 07/27/2021   Prediabetes 07/27/2021   Seasonal allergies 06/15/2021   Dyslipidemia 10/20/2020   Stage 3a chronic kidney disease (HCC) 10/20/2020   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   Morbid obesity (HCC) 08/18/2013   Essential hypertension 08/07/2008    Past Medical History:  Diagnosis Date   Allergy    Anxiety    Arthritis    Chest pain 08/03/08   Urgent care visit   Depression    Dyspnea    Edema    Heart murmur    Hypertension    Obesity     Past Surgical History:  Procedure Laterality Date   CESAREAN SECTION  04/16/1980   DILATION AND CURETTAGE OF UTERUS     laparoscopic    Social History   Socioeconomic History   Marital status: Single    Spouse name: Not on file   Number of children: 1   Years of education: Not on file   Highest education level: 12th grade  Occupational History   Occupation: 3 jobs    Comment: International aid/development worker  Tobacco Use   Smoking status: Never   Smokeless tobacco: Never   Tobacco comments:    Nonsmoker  Substance and Sexual Activity   Alcohol use: No    Alcohol/week: 0.0 standard  drinks of alcohol   Drug use: No   Sexual activity: Yes    Birth control/protection: None  Other Topics Concern   Not on file  Social History Narrative   Lives with mother   Marital status: no    Children: 1 daughter   Grandchildren - 2 (granddaughter and grandson)   Lives with: alone   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   Tobacco:  none   Alcohol:  none   Drugs:  none   Exercise:  Active job   Seatbelt: 100%   Guns in home: none      Filed for bankruptcy in 2010      Social Drivers of Health   Financial Resource Strain: Low Risk  (08/12/2023)   Overall Financial Resource Strain (CARDIA)    Difficulty of Paying Living Expenses:  Not hard at all  Food Insecurity: No Food Insecurity (08/12/2023)   Hunger Vital Sign    Worried About Running Out of Food in the Last Year: Never true    Ran Out of Food in the Last Year: Never true  Transportation Needs: No Transportation Needs (08/12/2023)   PRAPARE - Administrator, Civil Service (Medical): No    Lack of Transportation (Non-Medical): No  Physical Activity: Inactive (08/12/2023)   Exercise Vital Sign    Days of Exercise per Week: 0 days    Minutes of Exercise per Session: Not on file  Stress: No Stress Concern Present (08/12/2023)   [employer] of Occupational Health - Occupational Stress Questionnaire    Feeling of Stress: Only a little  Social Connections: Unknown (08/12/2023)   Social Connection and Isolation Panel    Frequency of Communication with Friends and Family: Once a week    Frequency of Social Gatherings with Friends and Family: Once a week    Attends Religious Services: Never    Database administrator or Organizations: No    Attends Engineer, structural: Not on file    Marital Status: Patient declined  Recent Concern: Social Connections - Socially Isolated (06/23/2023)   Social Connection and Isolation Panel    Frequency of Communication with Friends and Family: Once a week    Frequency of Social Gatherings with Friends and Family: Once a week    Attends Religious Services: Never    Database administrator or Organizations: No    Attends Engineer, structural: Not on file    Marital Status: Never married  Catering manager Violence: Not on file    Family History  Problem Relation Age of Onset   Prostate cancer Father    Congestive Heart Failure Father    Hypertension Father    Colon cancer Maternal Uncle    Colon polyps Mother    Breast cancer Mother 75       Left   Hyperlipidemia Mother    Hypertension Mother    Hypertension Sister    Hypertension Brother    Hypertension Sister    Hypertension Sister     Hypertension Sister    Hyperlipidemia Maternal Grandmother    Hypertension Maternal Grandmother    Esophageal cancer Neg Hx    Stomach cancer Neg Hx    Rectal cancer Neg Hx      Review of Systems  Constitutional: Negative.  Negative for chills and fever.  HENT: Negative.  Negative for congestion and sore throat.   Respiratory: Negative.  Negative for cough and shortness  of breath.   Cardiovascular: Negative.  Negative for chest pain and palpitations.  Gastrointestinal:  Negative for abdominal pain, diarrhea, nausea and vomiting.  Genitourinary: Negative.  Negative for dysuria and hematuria.  Skin: Negative.  Negative for rash.  Neurological: Negative.  Negative for dizziness and headaches.  All other systems reviewed and are negative.   Vitals:   11/26/23 0916  BP: (!) 164/94  Pulse: 73  Temp: 98.1 F (36.7 C)  SpO2: 96%    Physical Exam Vitals reviewed.  Constitutional:      Appearance: Normal appearance.  HENT:     Head: Normocephalic.  Eyes:     Extraocular Movements: Extraocular movements intact.     Pupils: Pupils are equal, round, and reactive to light.  Cardiovascular:     Rate and Rhythm: Normal rate and regular rhythm.     Pulses: Normal pulses.     Heart sounds: Normal heart sounds.  Pulmonary:     Effort: Pulmonary effort is normal.     Breath sounds: Normal breath sounds.  Musculoskeletal:     Cervical back: No tenderness.  Lymphadenopathy:     Cervical: No cervical adenopathy.  Skin:    General: Skin is warm and dry.     Capillary Refill: Capillary refill takes less than 2 seconds.  Neurological:     General: No focal deficit present.     Mental Status: She is alert and oriented to person, place, and time.  Psychiatric:        Mood and Affect: Mood normal.        Behavior: Behavior normal.      ASSESSMENT & PLAN: A total of 42 minutes was spent with the patient and counseling/coordination of care regarding preparing for this visit, review  of most recent office visit notes, review of multiple chronic medical conditions and their management, cardiovascular risks associated with hypertension, mental health management, review of all medications and changes made, review of most recent bloodwork results, review of health maintenance items, education on nutrition, prognosis, documentation, and need for follow up.  Problem List Items Addressed This Visit       Cardiovascular and Mediastinum   Essential hypertension - Primary   BP Readings from Last 3 Encounters:  11/26/23 (!) 164/94  11/22/23 104/82  08/13/23 (!) 158/74  Uncontrolled hypertension Continue olmesartan  40 mg and increase Aldactone  to 50 mg twice a day Monitor blood pressure readings at home daily for the next several weeks and contact the office if numbers persistently abnormal Follow-up in 3 months.  Earlier as needed         Genitourinary   Stage 3a chronic kidney disease (HCC)   Chronic stable condition Advised to stay well-hydrated and avoid NSAIDs         Other   Morbid obesity (HCC)   Diet and nutrition discussed Advised to decrease amount of daily carbohydrate intake and daily calories and increase amount of plant-based protein in her diet Benefits of exercise discussed      Dyslipidemia   Diet and nutrition discussed Chronic stable condition Continue rosuvastatin  10 mg daily The 10-year ASCVD risk score (Arnett DK, et al., 2019) is: 12.2%   Values used to calculate the score:     Age: 63 years     Clincally relevant sex: Female     Is Non-Hispanic African American: Yes     Diabetic: No     Tobacco smoker: No     Systolic Blood Pressure: 164 mmHg  Is BP treated: Yes     HDL Cholesterol: 51.2 mg/dL     Total Cholesterol: 157 mg/dL       Prediabetes   Cardiovascular risks associated with diabetes discussed Diet and nutrition discussed Advised to decrease amount of daily carbohydrate intake and daily calories and increase amount of  plant-based protein in her diet Benefits of exercise discussed      Situational anxiety   Active and affecting quality of life Known triggers and temporary situation at work Mental health management discussed      Other Visit Diagnoses       Hospital discharge follow-up            Patient Instructions  Continue losartan 40 mg daily Increase Aldactone  50 mg twice a day Monitor blood pressure readings at home daily for the next several weeks and contact the office if numbers persistently abnormal Follow-up in 3 months.  Earlier as needed  Hypertension, Adult High blood pressure (hypertension) is when the force of blood pumping through the arteries is too strong. The arteries are the blood vessels that carry blood from the heart throughout the body. Hypertension forces the heart to work harder to pump blood and may cause arteries to become narrow or stiff. Untreated or uncontrolled hypertension can lead to a heart attack, heart failure, a stroke, kidney disease, and other problems. A blood pressure reading consists of a higher number over a lower number. Ideally, your blood pressure should be below 120/80. The first (top) number is called the systolic pressure. It is a measure of the pressure in your arteries as your heart beats. The second (bottom) number is called the diastolic pressure. It is a measure of the pressure in your arteries as the heart relaxes. What are the causes? The exact cause of this condition is not known. There are some conditions that result in high blood pressure. What increases the risk? Certain factors may make you more likely to develop high blood pressure. Some of these risk factors are under your control, including: Smoking. Not getting enough exercise or physical activity. Being overweight. Having too much fat, sugar, calories, or salt (sodium) in your diet. Drinking too much alcohol. Other risk factors include: Having a personal history of heart  disease, diabetes, high cholesterol, or kidney disease. Stress. Having a family history of high blood pressure and high cholesterol. Having obstructive sleep apnea. Age. The risk increases with age. What are the signs or symptoms? High blood pressure may not cause symptoms. Very high blood pressure (hypertensive crisis) may cause: Headache. Fast or irregular heartbeats (palpitations). Shortness of breath. Nosebleed. Nausea and vomiting. Vision changes. Severe chest pain, dizziness, and seizures. How is this diagnosed? This condition is diagnosed by measuring your blood pressure while you are seated, with your arm resting on a flat surface, your legs uncrossed, and your feet flat on the floor. The cuff of the blood pressure monitor will be placed directly against the skin of your upper arm at the level of your heart. Blood pressure should be measured at least twice using the same arm. Certain conditions can cause a difference in blood pressure between your right and left arms. If you have a high blood pressure reading during one visit or you have normal blood pressure with other risk factors, you may be asked to: Return on a different day to have your blood pressure checked again. Monitor your blood pressure at home for 1 week or longer. If you are diagnosed with hypertension, you  may have other blood or imaging tests to help your health care provider understand your overall risk for other conditions. How is this treated? This condition is treated by making healthy lifestyle changes, such as eating healthy foods, exercising more, and reducing your alcohol intake. You may be referred for counseling on a healthy diet and physical activity. Your health care provider may prescribe medicine if lifestyle changes are not enough to get your blood pressure under control and if: Your systolic blood pressure is above 130. Your diastolic blood pressure is above 80. Your personal target blood pressure may  vary depending on your medical conditions, your age, and other factors. Follow these instructions at home: Eating and drinking  Eat a diet that is high in fiber and potassium, and low in sodium, added sugar, and fat. An example of this eating plan is called the DASH diet. DASH stands for Dietary Approaches to Stop Hypertension. To eat this way: Eat plenty of fresh fruits and vegetables. Try to fill one half of your plate at each meal with fruits and vegetables. Eat whole grains, such as whole-wheat pasta, brown rice, or whole-grain bread. Fill about one fourth of your plate with whole grains. Eat or drink low-fat dairy products, such as skim milk or low-fat yogurt. Avoid fatty cuts of meat, processed or cured meats, and poultry with skin. Fill about one fourth of your plate with lean proteins, such as fish, chicken without skin, beans, eggs, or tofu. Avoid pre-made and processed foods. These tend to be higher in sodium, added sugar, and fat. Reduce your daily sodium intake. Many people with hypertension should eat less than 1,500 mg of sodium a day. Do not drink alcohol if: Your health care provider tells you not to drink. You are pregnant, may be pregnant, or are planning to become pregnant. If you drink alcohol: Limit how much you have to: 0-1 drink a day for women. 0-2 drinks a day for men. Know how much alcohol is in your drink. In the [place] one drink equals one 12 oz bottle of beer (355 mL), one 5 oz glass of wine (148 mL), or one 1 oz glass of hard liquor (44 mL). Lifestyle  Work with your health care provider to maintain a healthy body weight or to lose weight. Ask what an ideal weight is for you. Get at least 30 minutes of exercise that causes your heart to beat faster (aerobic exercise) most days of the week. Activities may include walking, swimming, or biking. Include exercise to strengthen your muscles (resistance exercise), such as Pilates or lifting weights, as part of your  weekly exercise routine. Try to do these types of exercises for 30 minutes at least 3 days a week. Do not use any products that contain nicotine or tobacco. These products include cigarettes, chewing tobacco, and vaping devices, such as e-cigarettes. If you need help quitting, ask your health care provider. Monitor your blood pressure at home as told by your health care provider. Keep all follow-up visits. This is important. Medicines Take over-the-counter and prescription medicines only as told by your health care provider. Follow directions carefully. Blood pressure medicines must be taken as prescribed. Do not skip doses of blood pressure medicine. Doing this puts you at risk for problems and can make the medicine less effective. Ask your health care provider about side effects or reactions to medicines that you should watch for. Contact a health care provider if you: Think you are having a reaction to a medicine  you are taking. Have headaches that keep coming back (recurring). Feel dizzy. Have swelling in your ankles. Have trouble with your vision. Get help right away if you: Develop a severe headache or confusion. Have unusual weakness or numbness. Feel faint. Have severe pain in your chest or abdomen. Vomit repeatedly. Have trouble breathing. These symptoms may be an emergency. Get help right away. Call 911. Do not wait to see if the symptoms will go away. Do not drive yourself to the hospital. Summary Hypertension is when the force of blood pumping through your arteries is too strong. If this condition is not controlled, it may put you at risk for serious complications. Your personal target blood pressure may vary depending on your medical conditions, your age, and other factors. For most people, a normal blood pressure is less than 120/80. Hypertension is treated with lifestyle changes, medicines, or a combination of both. Lifestyle changes include losing weight, eating a healthy,  low-sodium diet, exercising more, and limiting alcohol. This information is not intended to replace advice given to you by your health care provider. Make sure you discuss any questions you have with your health care provider. Document Revised: 11/30/2020 Document Reviewed: 11/30/2020 Elsevier Patient Education  2024 Elsevier Inc.        Emil Schaumann, MD [facility]

## 2023-11-26 NOTE — Assessment & Plan Note (Signed)
 Cardiovascular risks associated with diabetes discussed Diet and nutrition discussed Advised to decrease amount of daily carbohydrate intake and daily calories and increase amount of plant-based protein in her diet Benefits of exercise discussed

## 2023-11-26 NOTE — Assessment & Plan Note (Signed)
Diet and nutrition discussed. Advised to decrease amount of daily carbohydrate intake and daily calories and increase amount of plant based protein in her diet Benefits of exercise discussed.

## 2023-11-26 NOTE — Assessment & Plan Note (Signed)
 BP Readings from Last 3 Encounters:  11/26/23 (!) 164/94  11/22/23 104/82  08/13/23 (!) 158/74  Uncontrolled hypertension Continue olmesartan  40 mg and increase Aldactone  to 50 mg twice a day Monitor blood pressure readings at home daily for the next several weeks and contact the office if numbers persistently abnormal Follow-up in 3 months.  Earlier as needed

## 2023-11-26 NOTE — Assessment & Plan Note (Signed)
 Chronic stable condition. Advised to stay well-hydrated and avoid NSAIDs

## 2023-12-27 ENCOUNTER — Ambulatory Visit: Admitting: Emergency Medicine

## 2023-12-31 ENCOUNTER — Ambulatory Visit: Payer: Self-pay | Admitting: Emergency Medicine

## 2023-12-31 ENCOUNTER — Encounter: Payer: Self-pay | Admitting: Emergency Medicine

## 2023-12-31 ENCOUNTER — Ambulatory Visit: Admitting: Emergency Medicine

## 2023-12-31 VITALS — BP 160/84 | HR 76 | Temp 98.3°F | Ht 59.0 in | Wt 229.0 lb

## 2023-12-31 DIAGNOSIS — N1831 Chronic kidney disease, stage 3a: Secondary | ICD-10-CM | POA: Diagnosis not present

## 2023-12-31 DIAGNOSIS — I10 Essential (primary) hypertension: Secondary | ICD-10-CM

## 2023-12-31 DIAGNOSIS — Z0000 Encounter for general adult medical examination without abnormal findings: Secondary | ICD-10-CM | POA: Diagnosis not present

## 2023-12-31 DIAGNOSIS — Z8739 Personal history of other diseases of the musculoskeletal system and connective tissue: Secondary | ICD-10-CM

## 2023-12-31 DIAGNOSIS — Z1231 Encounter for screening mammogram for malignant neoplasm of breast: Secondary | ICD-10-CM

## 2023-12-31 DIAGNOSIS — Z1329 Encounter for screening for other suspected endocrine disorder: Secondary | ICD-10-CM

## 2023-12-31 DIAGNOSIS — R7303 Prediabetes: Secondary | ICD-10-CM | POA: Diagnosis not present

## 2023-12-31 DIAGNOSIS — Z13228 Encounter for screening for other metabolic disorders: Secondary | ICD-10-CM | POA: Diagnosis not present

## 2023-12-31 DIAGNOSIS — E785 Hyperlipidemia, unspecified: Secondary | ICD-10-CM | POA: Diagnosis not present

## 2023-12-31 DIAGNOSIS — Z130 Encounter for screening for diseases of the blood and blood-forming organs and certain disorders involving the immune mechanism: Secondary | ICD-10-CM | POA: Diagnosis not present

## 2023-12-31 DIAGNOSIS — F418 Other specified anxiety disorders: Secondary | ICD-10-CM

## 2023-12-31 DIAGNOSIS — Z0001 Encounter for general adult medical examination with abnormal findings: Secondary | ICD-10-CM

## 2023-12-31 LAB — CBC WITH DIFFERENTIAL/PLATELET
Basophils Absolute: 0 K/uL (ref 0.0–0.1)
Basophils Relative: 0.5 % (ref 0.0–3.0)
Eosinophils Absolute: 0.3 K/uL (ref 0.0–0.7)
Eosinophils Relative: 4.5 % (ref 0.0–5.0)
HCT: 38.9 % (ref 36.0–46.0)
Hemoglobin: 12.6 g/dL (ref 12.0–15.0)
Lymphocytes Relative: 35 % (ref 12.0–46.0)
Lymphs Abs: 2 K/uL (ref 0.7–4.0)
MCHC: 32.5 g/dL (ref 30.0–36.0)
MCV: 84.3 fl (ref 78.0–100.0)
Monocytes Absolute: 0.4 K/uL (ref 0.1–1.0)
Monocytes Relative: 6.8 % (ref 3.0–12.0)
Neutro Abs: 3.1 K/uL (ref 1.4–7.7)
Neutrophils Relative %: 53.2 % (ref 43.0–77.0)
Platelets: 263 K/uL (ref 150.0–400.0)
RBC: 4.62 Mil/uL (ref 3.87–5.11)
RDW: 14.2 % (ref 11.5–15.5)
WBC: 5.8 K/uL (ref 4.0–10.5)

## 2023-12-31 LAB — COMPREHENSIVE METABOLIC PANEL WITH GFR
ALT: 21 U/L (ref 0–35)
AST: 25 U/L (ref 0–37)
Albumin: 4.6 g/dL (ref 3.5–5.2)
Alkaline Phosphatase: 92 U/L (ref 39–117)
BUN: 11 mg/dL (ref 6–23)
CO2: 31 meq/L (ref 19–32)
Calcium: 10.2 mg/dL (ref 8.4–10.5)
Chloride: 101 meq/L (ref 96–112)
Creatinine, Ser: 0.8 mg/dL (ref 0.40–1.20)
GFR: 78.87 mL/min (ref >60.00)
Glucose, Bld: 100 mg/dL — ABNORMAL HIGH (ref 70–99)
Potassium: 4.1 meq/L (ref 3.5–5.1)
Sodium: 139 meq/L (ref 135–145)
Total Bilirubin: 1.3 mg/dL — ABNORMAL HIGH (ref 0.2–1.2)
Total Protein: 8.1 g/dL (ref 6.0–8.3)

## 2023-12-31 LAB — LIPID PANEL
Cholesterol: 147 mg/dL (ref 0–200)
HDL: 56.1 mg/dL (ref >39.00)
LDL Cholesterol: 64 mg/dL (ref 0–99)
NonHDL: 91.09
Total CHOL/HDL Ratio: 3
Triglycerides: 137 mg/dL (ref 0.0–149.0)
VLDL: 27.4 mg/dL (ref 0.0–40.0)

## 2023-12-31 LAB — HEMOGLOBIN A1C: Hgb A1c MFr Bld: 5.6 % (ref 4.6–6.5)

## 2023-12-31 MED ORDER — OLMESARTAN MEDOXOMIL 40 MG PO TABS: 40.0000 mg | ORAL_TABLET | Freq: Every day | ORAL | 3 refills | Status: AC

## 2023-12-31 MED ORDER — WEGOVY 0.25 MG/0.5ML ~~LOC~~ SOAJ: 0.2500 mg | SUBCUTANEOUS | 3 refills | Status: DC

## 2023-12-31 MED ORDER — SPIRONOLACTONE 50 MG PO TABS: 50.0000 mg | ORAL_TABLET | Freq: Two times a day (BID) | ORAL | 3 refills | Status: AC

## 2023-12-31 NOTE — Assessment & Plan Note (Signed)
 Continues allopurinol 100 mg daily Uric acid done today No recent gout flareups

## 2023-12-31 NOTE — Progress Notes (Signed)
 Samantha Pearson 63 y.o.   Chief Complaint  Patient presents with   Annual Exam    HISTORY OF PRESENT ILLNESS: This is a 63 y.o. female here for annual exam and follow-up on multiple chronic medical conditions including hypertension Stressed out at home and at work  HPI   Prior to Admission medications   Medication Sig Start Date End Date Taking? Authorizing Provider  allopurinol (ZYLOPRIM) 100 MG tablet Take 100 mg by mouth daily.   Yes [provider]  diclofenac  Sodium (VOLTAREN ) 1 % GEL Apply 2 g topically 4 (four) times daily. 02/03/20  Yes Just, Kelsea J, FNP  HYDROcodone  bit-homatropine (HYCODAN) 5-1.5 MG/5ML syrup Take 5 mLs by mouth at bedtime as needed for cough. 08/13/23  Yes Purcell Emil Schanz, MD  olmesartan  (BENICAR ) 40 MG tablet TAKE 1 TABLET BY MOUTH EVERY DAY 11/29/22  Yes Scotlynn Noyes, Emil Schanz, MD  rosuvastatin  (CRESTOR ) 10 MG tablet TAKE 1 TABLET BY MOUTH EVERY DAY 10/16/23  Yes Josehua Hammar Jose, MD  spironolactone  (ALDACTONE ) 50 MG tablet TAKE 1 TABLET BY MOUTH EVERY DAY 07/18/23  Yes Thao Vanover, Emil Schanz, MD  Turmeric POWD 1,950 mg by Does not apply route 3 (three) times daily.   Yes [provider]    Allergies  Allergen Reactions   Chlorthalidone  Other (See Comments)    Severe gout   Doxycycline Hives, Itching and Swelling   Norvasc  [Amlodipine  Besylate] Swelling    Feet swelling   Septra [Bactrim] Hives, Itching and Swelling   Latex Swelling and Rash    Patient Active Problem List   Diagnosis Date Noted   Situational anxiety 11/26/2023   History of gout 07/27/2021   Prediabetes 07/27/2021   Seasonal allergies 06/15/2021   Dyslipidemia 10/20/2020   Stage 3a chronic kidney disease (HCC) 10/20/2020   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   Morbid obesity (HCC) 08/18/2013   Essential hypertension 08/07/2008    Past Medical History:  Diagnosis Date   Allergy    Anxiety    Arthritis    Chest pain  08/03/08   Urgent care visit   Depression    Dyspnea    Edema    Heart murmur    Hypertension    Obesity     Past Surgical History:  Procedure Laterality Date   CESAREAN SECTION  04/16/1980   DILATION AND CURETTAGE OF UTERUS     laparoscopic    Social History   Socioeconomic History   Marital status: Single    Spouse name: Not on file   Number of children: 1   Years of education: Not on file   Highest education level: 12th grade  Occupational History   Occupation: 3 jobs    Comment: International aid/development worker  Tobacco Use   Smoking status: Never   Smokeless tobacco: Never   Tobacco comments:    Nonsmoker  Substance and Sexual Activity   Alcohol use: No    Alcohol/week: 0.0 standard drinks of alcohol   Drug use: No   Sexual activity: Yes    Birth control/protection: None  Other Topics Concern   Not on file  Social History Narrative   Lives with mother   Marital status: no    Children: 1 daughter   Grandchildren - 2 (granddaughter and grandson)   Lives with: alone   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   Tobacco:  none   Alcohol:  none   Drugs:  none   Exercise:  Active job   Seatbelt: 100%   Guns in home: none      Filed for bankruptcy in 2010      Social Drivers of Health   Financial Resource Strain: Low Risk  (08/12/2023)   Overall Financial Resource Strain (CARDIA)    Difficulty of Paying Living Expenses: Not hard at all  Food Insecurity: No Food Insecurity (08/12/2023)   Hunger Vital Sign    Worried About Running Out of Food in the Last Year: Never true    Ran Out of Food in the Last Year: Never true  Transportation Needs: No Transportation Needs (08/12/2023)   PRAPARE - Administrator, Civil Service (Medical): No    Lack of Transportation (Non-Medical): No  Physical Activity: Inactive (08/12/2023)   Exercise Vital Sign    Days of Exercise per Week: 0 days    Minutes of Exercise per Session: Not on  file  Stress: No Stress Concern Present (08/12/2023)   [employer] of Occupational Health - Occupational Stress Questionnaire    Feeling of Stress: Only a little  Social Connections: Unknown (08/12/2023)   Social Connection and Isolation Panel    Frequency of Communication with Friends and Family: Once a week    Frequency of Social Gatherings with Friends and Family: Once a week    Attends Religious Services: Never    Database Administrator or Organizations: No    Attends Engineer, Structural: Not on file    Marital Status: Patient declined  Recent Concern: Social Connections - Socially Isolated (06/23/2023)   Social Connection and Isolation Panel    Frequency of Communication with Friends and Family: Once a week    Frequency of Social Gatherings with Friends and Family: Once a week    Attends Religious Services: Never    Database Administrator or Organizations: No    Attends Engineer, Structural: Not on file    Marital Status: Never married  Catering Manager Violence: Not on file    Family History  Problem Relation Age of Onset   Prostate cancer Father    Congestive Heart Failure Father    Hypertension Father    Colon cancer Maternal Uncle    Colon polyps Mother    Breast cancer Mother 45       Left   Hyperlipidemia Mother    Hypertension Mother    Hypertension Sister    Hypertension Brother    Hypertension Sister    Hypertension Sister    Hypertension Sister    Hyperlipidemia Maternal Grandmother    Hypertension Maternal Grandmother    Esophageal cancer Neg Hx    Stomach cancer Neg Hx    Rectal cancer Neg Hx      Review of Systems  Constitutional: Negative.  Negative for chills and fever.  HENT: Negative.  Negative for congestion and sore throat.   Respiratory: Negative.  Negative for cough and shortness of breath.   Cardiovascular: Negative.  Negative for chest pain and palpitations.  Gastrointestinal:  Negative for abdominal pain, diarrhea,  nausea and vomiting.  Genitourinary: Negative.  Negative for dysuria and hematuria.  Skin: Negative.  Negative for rash.  Neurological: Negative.  Negative for dizziness and headaches.  All other systems reviewed and are negative.   Vitals:   12/31/23 0918  BP: (!) 162/84  Pulse: 76  Temp: 98.3 F (36.8 C)  SpO2: 99%    Physical Exam Vitals reviewed.  Constitutional:  Appearance: Normal appearance. She is obese.  HENT:     Head: Normocephalic.     Right Ear: Tympanic membrane, ear canal and external ear normal.     Left Ear: Tympanic membrane, ear canal and external ear normal.     Mouth/Throat:     Mouth: Mucous membranes are moist.     Pharynx: Oropharynx is clear.  Eyes:     Extraocular Movements: Extraocular movements intact.     Conjunctiva/sclera: Conjunctivae normal.     Pupils: Pupils are equal, round, and reactive to light.  Cardiovascular:     Rate and Rhythm: Normal rate and regular rhythm.     Pulses: Normal pulses.     Heart sounds: Normal heart sounds.  Pulmonary:     Effort: Pulmonary effort is normal.     Breath sounds: Normal breath sounds.  Abdominal:     Palpations: Abdomen is soft.     Tenderness: There is no abdominal tenderness.  Musculoskeletal:     Cervical back: No tenderness.  Lymphadenopathy:     Cervical: No cervical adenopathy.  Skin:    General: Skin is warm and dry.     Capillary Refill: Capillary refill takes less than 2 seconds.  Neurological:     General: No focal deficit present.     Mental Status: She is alert and oriented to person, place, and time.  Psychiatric:        Mood and Affect: Mood normal.        Behavior: Behavior normal.      ASSESSMENT & PLAN: Problem List Items Addressed This Visit       Cardiovascular and Mediastinum   Essential hypertension   BP Readings from Last 3 Encounters:  12/31/23 (!) 160/84  11/26/23 (!) 164/94  11/22/23 104/82   Uncontrolled hypertension Continue olmesartan  40 mg  and increase Aldactone  to 50 mg twice a day Monitor blood pressure readings at home daily for the next several weeks and contact the office if numbers persistently abnormal Follow-up in 3 months.  Earlier as needed      Relevant Medications   olmesartan  (BENICAR ) 40 MG tablet   spironolactone  (ALDACTONE ) 50 MG tablet   Other Relevant Orders   Comprehensive metabolic panel with GFR     Genitourinary   Stage 3a chronic kidney disease (HCC)   Chronic stable condition Advised to stay well-hydrated and avoid NSAIDs       Relevant Orders   Comprehensive metabolic panel with GFR   CBC with Differential/Platelet     Other   Morbid obesity (HCC)   Diet and nutrition discussed Advised to decrease amount of daily carbohydrate intake and daily calories and increase amount of plant-based protein in her diet Benefits of exercise discussed Recommend GLP-1 agonist. Wegovy  prescription sent to pharmacy of record today      Relevant Medications   semaglutide -weight management (WEGOVY ) 0.25 MG/0.5ML SOAJ SQ injection   Other Relevant Orders   Comprehensive metabolic panel with GFR   CBC with Differential/Platelet   Hemoglobin A1c   Lipid panel   Dyslipidemia   Diet and nutrition discussed Chronic stable condition Continue rosuvastatin  10 mg daily      Relevant Orders   Lipid panel   History of gout   Continues allopurinol 100 mg daily Uric acid done today No recent gout flareups      Relevant Orders   Uric acid   Prediabetes   Cardiovascular risks associated with diabetes discussed Diet and nutrition discussed Advised to decrease  amount of daily carbohydrate intake and daily calories and increase amount of plant-based protein in her diet Benefits of exercise discussed      Relevant Orders   Hemoglobin A1c   Situational anxiety   Active and affecting quality of life Known triggers and temporary situation at work Mental health management discussed      Other Visit  Diagnoses       Encounter for general adult medical examination with abnormal findings    -  Primary   Relevant Orders   Comprehensive metabolic panel with GFR   CBC with Differential/Platelet   Hemoglobin A1c   Lipid panel     Screening for deficiency anemia       Relevant Orders   CBC with Differential/Platelet     Screening for endocrine, metabolic and immunity disorder       Relevant Orders   Comprehensive metabolic panel with GFR   Hemoglobin A1c      Modifiable risk factors discussed with patient. Anticipatory guidance according to age provided. The following topics were also discussed: Social Determinants of Health Smoking.  Non-smoker Diet and nutrition Benefits of exercise Cancer screening and need for breast cancer screening with mammogram Vaccinations review and recommendations Cardiovascular risk assessment and need for blood work Mental health including depression and anxiety Fall and accident prevention  Patient Instructions  Health Maintenance, Female Adopting a healthy lifestyle and getting preventive care are important in promoting health and wellness. Ask your health care provider about: The right schedule for you to have regular tests and exams. Things you can do on your own to prevent diseases and keep yourself healthy. What should I know about diet, weight, and exercise? Eat a healthy diet  Eat a diet that includes plenty of vegetables, fruits, low-fat dairy products, and lean protein. Do not eat a lot of foods that are high in solid fats, added sugars, or sodium. Maintain a healthy weight Body mass index (BMI) is used to identify weight problems. It estimates body fat based on height and weight. Your health care provider can help determine your BMI and help you achieve or maintain a healthy weight. Get regular exercise Get regular exercise. This is one of the most important things you can do for your health. Most adults should: Exercise for at least  150 minutes each week. The exercise should increase your heart rate and make you sweat (moderate-intensity exercise). Do strengthening exercises at least twice a week. This is in addition to the moderate-intensity exercise. Spend less time sitting. Even light physical activity can be beneficial. Watch cholesterol and blood lipids Have your blood tested for lipids and cholesterol at 62 years of age, then have this test every 5 years. Have your cholesterol levels checked more often if: Your lipid or cholesterol levels are high. You are older than 62 years of age. You are at high risk for heart disease. What should I know about cancer screening? Depending on your health history and family history, you may need to have cancer screening at various ages. This may include screening for: Breast cancer. Cervical cancer. Colorectal cancer. Skin cancer. Lung cancer. What should I know about heart disease, diabetes, and high blood pressure? Blood pressure and heart disease High blood pressure causes heart disease and increases the risk of stroke. This is more likely to develop in people who have high blood pressure readings or are overweight. Have your blood pressure checked: Every 3-5 years if you are 56-23 years of age. Every year if  you are 37 years old or older. Diabetes Have regular diabetes screenings. This checks your fasting blood sugar level. Have the screening done: Once every three years after age 32 if you are at a normal weight and have a low risk for diabetes. More often and at a younger age if you are overweight or have a high risk for diabetes. What should I know about preventing infection? Hepatitis B If you have a higher risk for hepatitis B, you should be screened for this virus. Talk with your health care provider to find out if you are at risk for hepatitis B infection. Hepatitis C Testing is recommended for: Everyone born from [birth year] through 1964. Anyone with known risk  factors for hepatitis C. Sexually transmitted infections (STIs) Get screened for STIs, including gonorrhea and chlamydia, if: You are sexually active and are younger than 62 years of age. You are older than 62 years of age and your health care provider tells you that you are at risk for this type of infection. Your sexual activity has changed since you were last screened, and you are at increased risk for chlamydia or gonorrhea. Ask your health care provider if you are at risk. Ask your health care provider about whether you are at high risk for HIV. Your health care provider may recommend a prescription medicine to help prevent HIV infection. If you choose to take medicine to prevent HIV, you should first get tested for HIV. You should then be tested every 3 months for as long as you are taking the medicine. Pregnancy If you are about to stop having your period (premenopausal) and you may become pregnant, seek counseling before you get pregnant. Take 400 to 800 micrograms (mcg) of folic acid every day if you become pregnant. Ask for birth control (contraception) if you want to prevent pregnancy. Osteoporosis and menopause Osteoporosis is a disease in which the bones lose minerals and strength with aging. This can result in bone fractures. If you are 48 years old or older, or if you are at risk for osteoporosis and fractures, ask your health care provider if you should: Be screened for bone loss. Take a calcium  or vitamin D supplement to lower your risk of fractures. Be given hormone replacement therapy (HRT) to treat symptoms of menopause. Follow these instructions at home: Alcohol use Do not drink alcohol if: Your health care provider tells you not to drink. You are pregnant, may be pregnant, or are planning to become pregnant. If you drink alcohol: Limit how much you have to: 0-1 drink a day. Know how much alcohol is in your drink. In the [place] one drink equals one 12 oz bottle of beer  (355 mL), one 5 oz glass of wine (148 mL), or one 1 oz glass of hard liquor (44 mL). Lifestyle Do not use any products that contain nicotine or tobacco. These products include cigarettes, chewing tobacco, and vaping devices, such as e-cigarettes. If you need help quitting, ask your health care provider. Do not use street drugs. Do not share needles. Ask your health care provider for help if you need support or information about quitting drugs. General instructions Schedule regular health, dental, and eye exams. Stay current with your vaccines. Tell your health care provider if: You often feel depressed. You have ever been abused or do not feel safe at home. Summary Adopting a healthy lifestyle and getting preventive care are important in promoting health and wellness. Follow your health care provider's instructions about healthy diet,  exercising, and getting tested or screened for diseases. Follow your health care provider's instructions on monitoring your cholesterol and blood pressure. This information is not intended to replace advice given to you by your health care provider. Make sure you discuss any questions you have with your health care provider. Document Revised: 06/14/2020 Document Reviewed: 06/14/2020 Elsevier Patient Education  2024 Elsevier Inc.     Emil Schaumann, MD [facility]

## 2023-12-31 NOTE — Assessment & Plan Note (Signed)
 Diet and nutrition discussed Advised to decrease amount of daily carbohydrate intake and daily calories and increase amount of plant-based protein in her diet Benefits of exercise discussed Recommend GLP-1 agonist. Wegovy  prescription sent to pharmacy of record today

## 2023-12-31 NOTE — Assessment & Plan Note (Signed)
 Active and affecting quality of life Known triggers and temporary situation at work Mental health management discussed

## 2023-12-31 NOTE — Assessment & Plan Note (Signed)
 Cardiovascular risks associated with diabetes discussed Diet and nutrition discussed Advised to decrease amount of daily carbohydrate intake and daily calories and increase amount of plant-based protein in her diet Benefits of exercise discussed

## 2023-12-31 NOTE — Assessment & Plan Note (Signed)
 BP Readings from Last 3 Encounters:  12/31/23 (!) 160/84  11/26/23 (!) 164/94  11/22/23 104/82   Uncontrolled hypertension Continue olmesartan  40 mg and increase Aldactone  to 50 mg twice a day Monitor blood pressure readings at home daily for the next several weeks and contact the office if numbers persistently abnormal Follow-up in 3 months.  Earlier as needed

## 2023-12-31 NOTE — Assessment & Plan Note (Signed)
 Chronic stable condition. Advised to stay well-hydrated and avoid NSAIDs

## 2023-12-31 NOTE — Assessment & Plan Note (Signed)
 Diet and nutrition discussed Chronic stable condition Continue rosuvastatin  10 mg daily

## 2023-12-31 NOTE — Patient Instructions (Signed)

## 2024-01-23 ENCOUNTER — Ambulatory Visit: Payer: Self-pay

## 2024-01-23 ENCOUNTER — Telehealth: Payer: Self-pay

## 2024-01-23 ENCOUNTER — Encounter: Payer: Self-pay | Admitting: Family Medicine

## 2024-01-23 ENCOUNTER — Ambulatory Visit: Admitting: Family Medicine

## 2024-01-23 VITALS — BP 158/86 | HR 81 | Temp 97.8°F | Ht 59.0 in | Wt 232.0 lb

## 2024-01-23 DIAGNOSIS — B0231 Zoster conjunctivitis: Secondary | ICD-10-CM | POA: Diagnosis not present

## 2024-01-23 NOTE — Progress Notes (Signed)
 Acute Office Visit   Subjective:  Patient ID: Samantha Pearson, female    DOB: Nov 27, 1960, 63 y.o.   MRN: [id number]  Chief Complaint  Patient presents with   eye swelling   Ear Pain    HPI Patient reports on Sunday while being out side in the wind, her left eye started itching. Then, Monday she developed left eye swelling, redness, and left ear pain with swelling. Yesterday, she developed a rash over left side of forehead. Rash is painful with some itching.  Reports vision change in the left eye-more blurry vision and pressure in the eye. She reports she feels like something is in the eye. This morning she had crusty beige color discharge.   Denies any fever.  She has been using eye wash, and over the counter ear and eye drops with no relief.    Patient reports she has an appointment with ophthalmology on Saturday with [facility].  ROS See HPI above      Objective:   BP (!) 158/86   Pulse 81   Temp 97.8 F (36.6 C) (Oral)   Ht 4' 11 (1.499 m)   Wt 232 lb (105.2 kg)   LMP 04/09/2014   SpO2 99%   BMI 46.86 kg/m    Physical Exam Vitals reviewed.  Constitutional:      General: She is not in acute distress.    Appearance: Normal appearance. She is morbidly obese. She is not ill-appearing, toxic-appearing or diaphoretic.  Eyes:     General:        Right eye: No discharge.        Left eye: Discharge present.    Conjunctiva/sclera: Conjunctivae normal.     Left eye: Exudate present.  Cardiovascular:     Rate and Rhythm: Normal rate and regular rhythm.     Heart sounds: Normal heart sounds. No murmur heard.    No friction rub. No gallop.  Pulmonary:     Effort: Pulmonary effort is normal. No respiratory distress.     Breath sounds: Normal breath sounds.  Musculoskeletal:        General: Normal range of motion.  Skin:    General: Skin is warm and dry.     Findings: Rash present.     Comments: Please see pictures of left side face with rash, eye swelling, facial  swelling.   Neurological:     General: No focal deficit present.     Mental Status: She is alert and oriented to person, place, and time. Mental status is at baseline.  Psychiatric:        Mood and Affect: Mood normal.        Behavior: Behavior normal.        Thought Content: Thought content normal.        Judgment: Judgment normal.           Assessment & Plan:  Herpes zoster conjunctivitis -     Ambulatory referral to Ophthalmology  Other orders -     valACYclovir HCl; Take 1 tablet (1,000 mg total) by mouth 3 (three) times daily for 7 days.  Dispense: 21 tablet; Refill: 0 -     Gabapentin; Take 1 capsule (300 mg total) by mouth 3 (three) times daily.  Dispense: 90 capsule; Refill: 0  -Based on your physical exam and symptoms. Diagnosed with shingles with concern that is in the left eye. -Placed a STAT referral to ophthalmology for further evaluation for shingles in the eye.  Advised to please call back to the office or send a MyChart message if she does not receive a phone call by 10:30 in the morning for an appointment. -Prescribed Valacyclovir 1000mg  tablet, take 1 tablet every 8 hours for 7 days. For today, please space out the medication, but take all three doses by bedtime.  -Prescribed Gabapentin 300mg  tablet, take 1 tablet every 8 hours as needed for nerve pain. Discussed about side effects of medication.  -Provided general information about shingles, nerve pain, and Gabapentin.  -Follow up as needed.   Leniyah Martell, NP

## 2024-01-23 NOTE — Telephone Encounter (Signed)
 We did not see her today for this problem.  Make sure this message gets to provider/office that saw her earlier today.  Thanks.

## 2024-01-23 NOTE — Telephone Encounter (Signed)
 Copied from CRM #[id number]. Topic: Clinical - Medical Advice >> Jan 23, 2024 12:42 PM Anairis L wrote: Reason for CRM: Dagoberto from [facility], calling in regarding patient referral for eye rash. Referral was reviewed and they determined she needs to go to [facility] and see on call eye provider.    Thanks,

## 2024-01-23 NOTE — Telephone Encounter (Signed)
 FYI Only or Action Required?: FYI only for provider: appointment scheduled on 01/23/24.  Patient was last seen in primary care on 12/31/2023 by Purcell Emil Schanz, MD.  Called Nurse Triage reporting [facility].  Symptoms began several days ago.  Interventions attempted: OTC medications: Ear and eye drops.  Symptoms are: gradually worsening.  Triage Disposition: See HCP Within 4 Hours (Or PCP Triage)  Patient/caregiver understands and will follow disposition?: Yes Reason for Disposition  [1] SEVERE eyelid swelling on one side AND [2] sinus pain or pressure  [1] SEVERE eyelid swelling on one side AND [2] red and painful (or tender to touch)  Answer Assessment - Initial Assessment Questions Left eye edema, left ear pain and pain behind her left ear that began 01/20/24 after being outdoors in the wind. No improvement with eye drops, eye wash and ear drops.  1. ONSET: When did the swelling start? (e.g., minutes, hours, days)     01/20/24 2. LOCATION: What part of the eyelid is swollen?     Left eyelid 3. SEVERITY: How swollen is it? (e.g., describe; mild, moderate, or severe)     Severe 4. ITCHING: Is there any itching? If Yes, ask: How much?   (Scale 1-10; mild, moderate or severe)     Yes, pruritic 5. PAIN: Is the swelling painful to touch? If Yes, ask: How painful is it?   (Scale 1-10; mild, moderate or severe)     Described as pressure 6. FEVER: Do you have a fever? If Yes, ask: What is it, how was it measured, and when did it start?      Denies 7. CAUSE: What do you think is causing the swelling?     Possibly allergies 8. RECURRENT SYMPTOM: Have you had eyelid swelling before? If Yes, ask: When was the last time? What happened that time?     Denies 9. OTHER SYMPTOMS: Do you have any other symptoms? (e.g., blurred vision, eye discharge, rash, runny nose)     Discharge, blurry vision, intermittent runny nose  Protocols used: Eyelid  Swelling-A-AH Copied from CRM #[id number]. Topic: Clinical - Red Word Triage >> Jan 23, 2024  8:58 AM Vena HERO wrote: Red Word that prompted transfer to Nurse Triage: swollen left eye and face for about a week, face is sore and eyes are itchy and symptoms have gotten worse

## 2024-01-23 NOTE — Patient Instructions (Addendum)
-  It was a pleasure to care for you. -Based on your physical exam and symptoms. You have shingles with concern that is is in your eye. -Placed a STAT referral to ophthalmology for further evaluation for shingles in the eye. Please call back to the office or send a MyChart message if you do not receive a phone call by 10:30 in the morning for an appointment. -Prescribed Valacyclovir 1000mg  tablet, take 1 tablet every 8 hours for 7 days. For today, please space out the medication, but take all three doses by bedtime.  -Prescribed Gabapentin 300mg  tablet, take 1 tablet every 8 hours as needed for nerve pain. Discussed about side effects of medication.  -Provided general information about shingles, nerve pain, and Gabapentin.  -Follow up as needed.

## 2024-01-24 ENCOUNTER — Ambulatory Visit (HOSPITAL_COMMUNITY)

## 2024-01-24 ENCOUNTER — Emergency Department (HOSPITAL_BASED_OUTPATIENT_CLINIC_OR_DEPARTMENT_OTHER)
Admission: EM | Admit: 2024-01-24 | Discharge: 2024-01-24 | Disposition: A | Discharge status: Nursing Home (Includes ICF) | Source: Home / Self Care | Attending: Emergency Medicine | Admitting: Emergency Medicine

## 2024-01-24 ENCOUNTER — Other Ambulatory Visit: Payer: Self-pay

## 2024-01-24 DIAGNOSIS — B0231 Zoster conjunctivitis: Secondary | ICD-10-CM | POA: Insufficient documentation

## 2024-01-24 DIAGNOSIS — Z91040 Latex allergy status: Secondary | ICD-10-CM | POA: Insufficient documentation

## 2024-01-24 DIAGNOSIS — B0230 Zoster ocular disease, unspecified: Secondary | ICD-10-CM | POA: Diagnosis not present

## 2024-01-24 DIAGNOSIS — H5712 Ocular pain, left eye: Secondary | ICD-10-CM | POA: Diagnosis not present

## 2024-01-24 MED ORDER — FLUORESCEIN SODIUM 1 MG OP STRP
1.0000 | ORAL_STRIP | Freq: Once | OPHTHALMIC | Status: AC
  Administered 2024-01-24: 12:00:00 1 via OPHTHALMIC
  Filled 2024-01-24: qty 1

## 2024-01-24 MED ORDER — TETRACAINE HCL 0.5 % OP SOLN
2.0000 [drp] | Freq: Once | OPHTHALMIC | Status: AC
  Administered 2024-01-24: 12:00:00 2 [drp] via OPHTHALMIC
  Filled 2024-01-24: qty 4

## 2024-01-24 NOTE — ED Notes (Signed)
 Discharge paperwork reviewed entirely with patient, including follow up care. Pain was under control. No prescriptions were called in, but all questions were addressed.  Pt verbalized understanding as well as all parties involved. No questions or concerns voiced at the time of discharge. No acute distress noted. Pt was encouraged to stay adequately hydrated and eat a healthy diet.   Pt ambulated out to PVA without incident or assistance.  Pt advised they will seek followup care with a specialist and followup with their PCP. She is going directly to an eye doc for followup.   The pt was instructed to set up and/or review MyChart for their results; and was informed their Providers all have access to the information as well.

## 2024-01-24 NOTE — ED Triage Notes (Signed)
 Pt from [facility] c/o swelling and pain in L eye since Tues, dx with shingles at [facility] yest, stating UC told her to come here for the on call eye doctor to see her eye.

## 2024-01-24 NOTE — Discharge Instructions (Signed)
 Please go to the address above to be evaluated for the herpes infection around your left eye.  You are being worked in to the schedule and may have a bit of a wait, but please go immediately to be seen today.

## 2024-01-24 NOTE — ED Provider Notes (Signed)
 [place] [facility] DE[facility] AT [facility] Provider Note   CSN: [id number] Arrival date & time: 01/24/24  1020     Patient presents with: No chief complaint on file.   Samantha Pearson is a 63 y.o. female.   Patient presents to the emergency department for evaluation of left eye pain and facial rash.  Patient states that she had some eye irritation, initially thought that her allergies were flaring up.  She then developed the rash starting 2 days ago, went to urgent care yesterday and was prescribed valacyclovir.  She has taken a total of 5 doses.  It sounds like they were trying to arrange for her to get into an ophthalmologist, but was unable to arrange this, so they recommended that she come to the emergency department.  Patient has some blurry vision, no loss of vision.  She has a foreign body sensation.       Prior to Admission medications  Medication Sig Start Date End Date Taking? Authorizing Provider  allopurinol (ZYLOPRIM) 100 MG tablet Take 100 mg by mouth daily.    [provider]  diclofenac  Sodium (VOLTAREN ) 1 % GEL Apply 2 g topically 4 (four) times daily. 02/03/20   Just, Kelsea J, FNP  gabapentin (NEURONTIN) 300 MG capsule Take 1 capsule (300 mg total) by mouth 3 (three) times daily. 01/23/24 02/22/24  Billy Philippe SAUNDERS, NP  HYDROcodone  bit-homatropine (HYCODAN) 5-1.5 MG/5ML syrup Take 5 mLs by mouth at bedtime as needed for cough. 08/13/23   Sagardia, Miguel Jose, MD  olmesartan  (BENICAR ) 40 MG tablet Take 1 tablet (40 mg total) by mouth daily. 12/31/23   Purcell Emil Schanz, MD  rosuvastatin  (CRESTOR ) 10 MG tablet TAKE 1 TABLET BY MOUTH EVERY DAY 10/16/23   Sagardia, Miguel Jose, MD  spironolactone  (ALDACTONE ) 50 MG tablet Take 1 tablet (50 mg total) by mouth 2 (two) times daily. 12/31/23   Purcell Emil Schanz, MD  Turmeric POWD 1,950 mg by Does not apply route 3 (three) times daily.    [provider]  valACYclovir (VALTREX) 1000 MG  tablet Take 1 tablet (1,000 mg total) by mouth 3 (three) times daily for 7 days. 01/23/24 01/30/24  Billy Philippe SAUNDERS, NP    Allergies: Chlorthalidone , Doxycycline, Norvasc  [amlodipine  besylate], Septra [bactrim], and Latex    Review of Systems  Updated Vital Signs BP 136/80   Pulse 96   Temp 99.1 F (37.3 C)   Resp 14   Ht 4' 11 (1.499 m)   Wt 104.3 kg   LMP 04/09/2014   SpO2 98%   BMI 46.45 kg/m   Physical Exam Vitals and nursing note reviewed.  Constitutional:      Appearance: She is well-developed.  HENT:     Head: Normocephalic and atraumatic.  Eyes:     Extraocular Movements: Extraocular movements intact.     Conjunctiva/sclera:     Right eye: Right conjunctiva is not injected. No chemosis, exudate or hemorrhage.    Left eye: Left conjunctiva is injected. Chemosis present. No exudate or hemorrhage.    Pupils:     Right eye: Pupil is round, reactive and not sluggish.     Left eye: Pupil is round, reactive and not sluggish. No fluorescein  uptake.     Comments: Edema of the left upper eyelid.  There is a rash with vesicles, left forehead, left superior orbit, bridge of the nose on the left side --consistent with herpes ophthalmicus.   On fluorescein  staining, the cornea appears clear.  Pulmonary:  Effort: No respiratory distress.  Musculoskeletal:     Cervical back: Normal range of motion and neck supple.  Skin:    General: Skin is warm and dry.  Neurological:     Mental Status: She is alert.     (all labs ordered are listed, but only abnormal results are displayed) Labs Reviewed - No data to display  EKG: None  Radiology: No results found.   Procedures   Medications Ordered in the ED  fluorescein  ophthalmic strip 1 strip (1 strip Left Eye Given 01/24/24 1222)  tetracaine  (PONTOCAINE) 0.5 % ophthalmic solution 2 drop (2 drops Left Eye Given 01/24/24 1221)   ED Course  Patient seen and examined. History obtained directly from patient.    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  12:35 PM eye exam performed.  Will discuss with ophthalmology for follow-up plan.    Visual Acuity  Right Eye Distance: 20/20 Left Eye Distance: 20/20 Bilateral Distance: 20/20  Right Eye Near:   Left Eye Near:    Bilateral Near:     1:45 PM Reassessment performed. Patient appears stable.  I discussed case with on-call ophthalmologist, Dr. Waylan.  They are happy to work patient into office schedule today and asked that the patient be discharged and come to the office for evaluation.  Reviewed pertinent lab work and imaging with patient at bedside. Questions answered.   Most current vital signs reviewed and are as follows: BP 136/80   Pulse 96   Temp 99.1 F (37.3 C)   Resp 14   Ht 4' 11 (1.499 m)   Wt 104.3 kg   LMP 04/09/2014   SpO2 98%   BMI 46.45 kg/m   Plan: Discharge to ophthalmology office.   Prescriptions written for: None  Other home care instructions discussed: None  ED return instructions discussed: Vision loss  Follow-up instructions discussed: Follow-up with ophthalmology today as planned.                                 Medical Decision Making Risk Prescription drug management.   Patient with herpes zoster ophthalmicus.  Patient is already on valacyclovir.  Cornea appears clear on fluorescein  exam today.  Discussed with ophthalmology and they would like to see patient today.  Patient to be discharged to ophthalmology office.     Final diagnoses:  Herpes zoster conjunctivitis    ED Discharge Orders     None           Desiderio Chew, PA-C 01/24/24 1346

## 2024-02-04 DIAGNOSIS — B0230 Zoster ocular disease, unspecified: Secondary | ICD-10-CM | POA: Diagnosis not present

## 2024-03-06 ENCOUNTER — Encounter: Payer: Self-pay | Admitting: *Deleted

## 2024-03-06 NOTE — Progress Notes (Signed)
 Samantha Pearson                                          MRN: [id number]   03/06/2024   The [facility] Quality Team Specialist reviewed this patient medical record for the purposes of chart review for care gap closure. The following were reviewed: chart review for care gap closure-controlling blood pressure.    [facility] Quality Team

## 2024-07-03 ENCOUNTER — Ambulatory Visit: Admitting: Emergency Medicine
